# Patient Record
Sex: Male | Born: 1940 | Race: White | Hispanic: No | Smoking: Never smoker
Health system: Southern US, Community
[De-identification: ages and names within clinical notes are randomized; demographics above are authoritative.]

## PROBLEM LIST (undated history)

## (undated) DIAGNOSIS — I6529 Occlusion and stenosis of unspecified carotid artery: Secondary | ICD-10-CM

## (undated) DIAGNOSIS — I480 Paroxysmal atrial fibrillation: Secondary | ICD-10-CM

## (undated) DIAGNOSIS — M549 Dorsalgia, unspecified: Secondary | ICD-10-CM

## (undated) DIAGNOSIS — J42 Unspecified chronic bronchitis: Secondary | ICD-10-CM

## (undated) DIAGNOSIS — G4733 Obstructive sleep apnea (adult) (pediatric): Secondary | ICD-10-CM

## (undated) DIAGNOSIS — I739 Peripheral vascular disease, unspecified: Secondary | ICD-10-CM

## (undated) DIAGNOSIS — I2510 Atherosclerotic heart disease of native coronary artery without angina pectoris: Secondary | ICD-10-CM

## (undated) DIAGNOSIS — K219 Gastro-esophageal reflux disease without esophagitis: Secondary | ICD-10-CM

## (undated) DIAGNOSIS — N183 Chronic kidney disease, stage 3 unspecified: Secondary | ICD-10-CM

## (undated) DIAGNOSIS — R011 Cardiac murmur, unspecified: Secondary | ICD-10-CM

## (undated) DIAGNOSIS — N184 Chronic kidney disease, stage 4 (severe): Secondary | ICD-10-CM

## (undated) DIAGNOSIS — N200 Calculus of kidney: Secondary | ICD-10-CM

## (undated) DIAGNOSIS — I779 Disorder of arteries and arterioles, unspecified: Secondary | ICD-10-CM

## (undated) DIAGNOSIS — E785 Hyperlipidemia, unspecified: Secondary | ICD-10-CM

## (undated) DIAGNOSIS — E119 Type 2 diabetes mellitus without complications: Secondary | ICD-10-CM

## (undated) DIAGNOSIS — Z9289 Personal history of other medical treatment: Secondary | ICD-10-CM

## (undated) DIAGNOSIS — I5031 Acute diastolic (congestive) heart failure: Secondary | ICD-10-CM

## (undated) DIAGNOSIS — J189 Pneumonia, unspecified organism: Secondary | ICD-10-CM

## (undated) DIAGNOSIS — I639 Cerebral infarction, unspecified: Secondary | ICD-10-CM

## (undated) DIAGNOSIS — I10 Essential (primary) hypertension: Secondary | ICD-10-CM

## (undated) DIAGNOSIS — M1990 Unspecified osteoarthritis, unspecified site: Secondary | ICD-10-CM

## (undated) DIAGNOSIS — Z9989 Dependence on other enabling machines and devices: Secondary | ICD-10-CM

## (undated) DIAGNOSIS — G8929 Other chronic pain: Secondary | ICD-10-CM

## (undated) HISTORY — DX: Hyperlipidemia, unspecified: E78.5

## (undated) HISTORY — DX: Disorder of arteries and arterioles, unspecified: I77.9

## (undated) HISTORY — DX: Peripheral vascular disease, unspecified: I73.9

## (undated) HISTORY — DX: Personal history of other medical treatment: Z92.89

## (undated) HISTORY — PX: CARDIAC CATHETERIZATION: SHX172

## (undated) HISTORY — PX: CYSTOSCOPY W/ STONE MANIPULATION: SHX1427

## (undated) HISTORY — DX: Other chronic pain: G89.29

## (undated) HISTORY — DX: Paroxysmal atrial fibrillation: I48.0

## (undated) HISTORY — DX: Atherosclerotic heart disease of native coronary artery without angina pectoris: I25.10

## (undated) HISTORY — DX: Gastro-esophageal reflux disease without esophagitis: K21.9

## (undated) HISTORY — DX: Chronic kidney disease, stage 4 (severe): N18.4

## (undated) HISTORY — DX: Dorsalgia, unspecified: M54.9

## (undated) HISTORY — DX: Chronic kidney disease, stage 3 unspecified: N18.30

## (undated) HISTORY — DX: Occlusion and stenosis of unspecified carotid artery: I65.29

## (undated) HISTORY — DX: Essential (primary) hypertension: I10

## (undated) HISTORY — DX: Acute diastolic (congestive) heart failure: I50.31

---

## 1960-12-30 HISTORY — PX: INGUINAL HERNIA REPAIR: SUR1180

## 1967-12-31 HISTORY — PX: KIDNEY STONE SURGERY: SHX686

## 1998-10-13 ENCOUNTER — Ambulatory Visit (HOSPITAL_COMMUNITY): Admission: RE | Admit: 1998-10-13 | Discharge: 1998-10-13 | Payer: Self-pay | Admitting: Family Medicine

## 1998-10-13 ENCOUNTER — Encounter: Payer: Self-pay | Admitting: Family Medicine

## 1998-10-17 ENCOUNTER — Encounter: Admission: RE | Admit: 1998-10-17 | Discharge: 1999-01-15 | Payer: Self-pay | Admitting: Family Medicine

## 1999-12-31 DIAGNOSIS — I739 Peripheral vascular disease, unspecified: Secondary | ICD-10-CM

## 1999-12-31 HISTORY — DX: Peripheral vascular disease, unspecified: I73.9

## 2002-06-02 ENCOUNTER — Encounter: Admission: RE | Admit: 2002-06-02 | Discharge: 2002-06-02 | Payer: Self-pay | Admitting: Family Medicine

## 2002-06-02 ENCOUNTER — Encounter: Payer: Self-pay | Admitting: Family Medicine

## 2002-11-01 ENCOUNTER — Encounter: Payer: Self-pay | Admitting: Emergency Medicine

## 2002-11-01 ENCOUNTER — Inpatient Hospital Stay (HOSPITAL_COMMUNITY): Admission: EM | Admit: 2002-11-01 | Discharge: 2002-11-07 | Payer: Self-pay | Admitting: Neurosurgery

## 2002-11-02 ENCOUNTER — Encounter: Payer: Self-pay | Admitting: Neurosurgery

## 2004-10-31 ENCOUNTER — Ambulatory Visit: Payer: Self-pay | Admitting: Family Medicine

## 2004-11-15 ENCOUNTER — Ambulatory Visit: Payer: Self-pay | Admitting: Family Medicine

## 2004-11-28 ENCOUNTER — Ambulatory Visit: Payer: Self-pay | Admitting: Family Medicine

## 2005-02-07 ENCOUNTER — Ambulatory Visit: Payer: Self-pay | Admitting: Family Medicine

## 2005-03-05 ENCOUNTER — Ambulatory Visit: Payer: Self-pay | Admitting: Family Medicine

## 2005-03-20 ENCOUNTER — Encounter: Admission: RE | Admit: 2005-03-20 | Discharge: 2005-03-20 | Payer: Self-pay | Admitting: Family Medicine

## 2005-03-20 ENCOUNTER — Ambulatory Visit: Payer: Self-pay | Admitting: Family Medicine

## 2005-03-22 ENCOUNTER — Ambulatory Visit: Payer: Self-pay | Admitting: Family Medicine

## 2005-03-26 ENCOUNTER — Ambulatory Visit: Payer: Self-pay | Admitting: Family Medicine

## 2005-04-02 ENCOUNTER — Ambulatory Visit: Payer: Self-pay | Admitting: Family Medicine

## 2005-04-09 ENCOUNTER — Ambulatory Visit: Payer: Self-pay | Admitting: Family Medicine

## 2005-04-15 ENCOUNTER — Ambulatory Visit: Payer: Self-pay | Admitting: Family Medicine

## 2005-04-22 ENCOUNTER — Ambulatory Visit: Payer: Self-pay | Admitting: Family Medicine

## 2005-05-10 ENCOUNTER — Ambulatory Visit: Payer: Self-pay | Admitting: Family Medicine

## 2005-08-19 ENCOUNTER — Ambulatory Visit: Payer: Self-pay | Admitting: Family Medicine

## 2005-08-28 ENCOUNTER — Ambulatory Visit: Payer: Self-pay | Admitting: Family Medicine

## 2005-11-01 ENCOUNTER — Ambulatory Visit: Payer: Self-pay | Admitting: Family Medicine

## 2005-11-14 ENCOUNTER — Ambulatory Visit: Payer: Self-pay | Admitting: Internal Medicine

## 2005-11-28 ENCOUNTER — Encounter (INDEPENDENT_AMBULATORY_CARE_PROVIDER_SITE_OTHER): Payer: Self-pay | Admitting: Specialist

## 2005-11-28 ENCOUNTER — Ambulatory Visit: Payer: Self-pay | Admitting: Internal Medicine

## 2006-11-04 ENCOUNTER — Ambulatory Visit: Payer: Self-pay | Admitting: Family Medicine

## 2006-12-18 ENCOUNTER — Ambulatory Visit: Payer: Self-pay | Admitting: Family Medicine

## 2006-12-18 LAB — CONVERTED CEMR LAB
ALT: 24 units/L (ref 0–40)
AST: 23 units/L (ref 0–37)
Alkaline Phosphatase: 46 units/L (ref 39–117)
BUN: 23 mg/dL (ref 6–23)
Basophils Absolute: 0.1 10*3/uL (ref 0.0–0.1)
Basophils Relative: 0.8 % (ref 0.0–1.0)
Chol/HDL Ratio, serum: 2.9
Cholesterol: 121 mg/dL (ref 0–200)
Creatinine, Ser: 1 mg/dL (ref 0.4–1.5)
Creatinine,U: 233.6 mg/dL
Eosinophil percent: 2.4 % (ref 0.0–5.0)
Glucose, Bld: 188 mg/dL — ABNORMAL HIGH (ref 70–99)
HCT: 41.6 % (ref 39.0–52.0)
HDL: 41.2 mg/dL (ref >39.0)
Hemoglobin: 14.4 g/dL (ref 13.0–17.0)
Hgb A1c MFr Bld: 8 % — ABNORMAL HIGH (ref 4.6–6.0)
LDL Cholesterol: 57 mg/dL (ref 0–99)
Lymphocytes Relative: 30.7 % (ref 12.0–46.0)
MCHC: 34.5 g/dL (ref 30.0–36.0)
MCV: 90.7 fL (ref 78.0–100.0)
Microalb Creat Ratio: 3.9 mg/g (ref 0.0–30.0)
Microalb, Ur: 0.9 mg/dL (ref 0.0–1.9)
Monocytes Absolute: 0.8 10*3/uL — ABNORMAL HIGH (ref 0.2–0.7)
Monocytes Relative: 9.8 % (ref 3.0–11.0)
Neutro Abs: 4.8 10*3/uL (ref 1.4–7.7)
Neutrophils Relative %: 56.3 % (ref 43.0–77.0)
PSA: 0.96 ng/mL (ref 0.10–4.00)
Platelets: 266 10*3/uL (ref 150–400)
Potassium: 4.1 meq/L (ref 3.5–5.1)
RBC: 4.59 M/uL (ref 4.22–5.81)
RDW: 13.1 % (ref 11.5–14.6)
TSH: 1.31 microintl units/mL (ref 0.35–5.50)
Triglyceride fasting, serum: 114 mg/dL (ref 0–149)
VLDL: 23 mg/dL (ref 0–40)
WBC: 8.5 10*3/uL (ref 4.5–10.5)

## 2006-12-22 ENCOUNTER — Encounter: Payer: Self-pay | Admitting: Family Medicine

## 2006-12-22 LAB — CONVERTED CEMR LAB: PSA: 0.96 ng/mL

## 2006-12-31 ENCOUNTER — Ambulatory Visit: Payer: Self-pay | Admitting: Family Medicine

## 2007-01-28 ENCOUNTER — Ambulatory Visit: Payer: Self-pay | Admitting: Family Medicine

## 2007-04-28 ENCOUNTER — Ambulatory Visit: Payer: Self-pay | Admitting: Family Medicine

## 2007-05-28 ENCOUNTER — Ambulatory Visit: Payer: Self-pay | Admitting: Family Medicine

## 2007-05-28 DIAGNOSIS — I10 Essential (primary) hypertension: Secondary | ICD-10-CM | POA: Insufficient documentation

## 2007-05-28 DIAGNOSIS — F528 Other sexual dysfunction not due to a substance or known physiological condition: Secondary | ICD-10-CM | POA: Insufficient documentation

## 2007-05-29 ENCOUNTER — Ambulatory Visit: Payer: Self-pay | Admitting: Cardiology

## 2007-06-17 ENCOUNTER — Ambulatory Visit: Payer: Self-pay

## 2007-06-23 ENCOUNTER — Ambulatory Visit: Payer: Self-pay | Admitting: Cardiology

## 2007-06-24 ENCOUNTER — Ambulatory Visit: Payer: Self-pay | Admitting: Cardiology

## 2007-06-24 LAB — CONVERTED CEMR LAB
BUN: 15 mg/dL (ref 6–23)
Basophils Absolute: 0.1 10*3/uL (ref 0.0–0.1)
Basophils Relative: 0.8 % (ref 0.0–1.0)
CO2: 32 meq/L (ref 19–32)
Calcium: 9.2 mg/dL (ref 8.4–10.5)
Chloride: 100 meq/L (ref 96–112)
Creatinine, Ser: 0.8 mg/dL (ref 0.4–1.5)
Eosinophils Absolute: 0.2 10*3/uL (ref 0.0–0.6)
Eosinophils Relative: 3.1 % (ref 0.0–5.0)
GFR calc Af Amer: 125 mL/min
GFR calc non Af Amer: 103 mL/min
Glucose, Bld: 173 mg/dL — ABNORMAL HIGH (ref 70–99)
HCT: 40.3 % (ref 39.0–52.0)
Hemoglobin: 13.8 g/dL (ref 13.0–17.0)
INR: 0.9 (ref 0.9–2.0)
Lymphocytes Relative: 29 % (ref 12.0–46.0)
MCHC: 34.2 g/dL (ref 30.0–36.0)
MCV: 87.5 fL (ref 78.0–100.0)
Monocytes Absolute: 0.7 10*3/uL (ref 0.2–0.7)
Monocytes Relative: 9.7 % (ref 3.0–11.0)
Neutro Abs: 4.3 10*3/uL (ref 1.4–7.7)
Neutrophils Relative %: 57.4 % (ref 43.0–77.0)
Platelets: 247 10*3/uL (ref 150–400)
Potassium: 4.2 meq/L (ref 3.5–5.1)
Prothrombin Time: 11.6 s (ref 10.0–14.0)
RBC: 4.61 M/uL (ref 4.22–5.81)
RDW: 14.2 % (ref 11.5–14.6)
Sodium: 144 meq/L (ref 135–145)
WBC: 7.4 10*3/uL (ref 4.5–10.5)
aPTT: 27.7 s (ref 26.5–36.5)

## 2007-06-25 ENCOUNTER — Inpatient Hospital Stay (HOSPITAL_BASED_OUTPATIENT_CLINIC_OR_DEPARTMENT_OTHER): Admission: RE | Admit: 2007-06-25 | Discharge: 2007-06-25 | Payer: Self-pay | Admitting: Cardiovascular Disease

## 2007-06-25 ENCOUNTER — Ambulatory Visit: Payer: Self-pay | Admitting: Cardiology

## 2007-06-30 ENCOUNTER — Ambulatory Visit (HOSPITAL_COMMUNITY): Admission: RE | Admit: 2007-06-30 | Discharge: 2007-07-01 | Payer: Self-pay | Admitting: Cardiology

## 2007-06-30 ENCOUNTER — Ambulatory Visit: Payer: Self-pay | Admitting: Cardiology

## 2007-06-30 HISTORY — PX: CORONARY ANGIOPLASTY WITH STENT PLACEMENT: SHX49

## 2007-07-10 ENCOUNTER — Ambulatory Visit: Payer: Self-pay | Admitting: Cardiology

## 2007-07-10 LAB — CONVERTED CEMR LAB
Basophils Absolute: 0.1 10*3/uL (ref 0.0–0.1)
Basophils Relative: 0.7 % (ref 0.0–1.0)
Eosinophils Absolute: 0.2 10*3/uL (ref 0.0–0.6)
Eosinophils Relative: 2.4 % (ref 0.0–5.0)
HCT: 37 % — ABNORMAL LOW (ref 39.0–52.0)
Hemoglobin: 12.7 g/dL — ABNORMAL LOW (ref 13.0–17.0)
Lymphocytes Relative: 23.5 % (ref 12.0–46.0)
MCHC: 34.4 g/dL (ref 30.0–36.0)
MCV: 89.2 fL (ref 78.0–100.0)
Monocytes Absolute: 0.8 10*3/uL — ABNORMAL HIGH (ref 0.2–0.7)
Monocytes Relative: 9.2 % (ref 3.0–11.0)
Neutro Abs: 5.5 10*3/uL (ref 1.4–7.7)
Neutrophils Relative %: 64.2 % (ref 43.0–77.0)
Platelets: 287 10*3/uL (ref 150–400)
RBC: 4.15 M/uL — ABNORMAL LOW (ref 4.22–5.81)
RDW: 14.6 % (ref 11.5–14.6)
WBC: 8.6 10*3/uL (ref 4.5–10.5)

## 2007-07-21 ENCOUNTER — Ambulatory Visit: Payer: Self-pay | Admitting: Cardiology

## 2007-07-23 ENCOUNTER — Encounter (HOSPITAL_COMMUNITY): Admission: RE | Admit: 2007-07-23 | Discharge: 2007-10-21 | Payer: Self-pay | Admitting: Cardiology

## 2007-08-06 ENCOUNTER — Ambulatory Visit: Payer: Self-pay | Admitting: Cardiology

## 2007-08-06 LAB — CONVERTED CEMR LAB
Basophils Absolute: 0.1 10*3/uL (ref 0.0–0.1)
Basophils Relative: 0.7 % (ref 0.0–1.0)
Eosinophils Absolute: 0.2 10*3/uL (ref 0.0–0.6)
Eosinophils Relative: 3.1 % (ref 0.0–5.0)
HCT: 39.8 % (ref 39.0–52.0)
Hemoglobin: 13.9 g/dL (ref 13.0–17.0)
Lymphocytes Relative: 23.3 % (ref 12.0–46.0)
MCHC: 34.9 g/dL (ref 30.0–36.0)
MCV: 90.8 fL (ref 78.0–100.0)
Monocytes Absolute: 0.8 10*3/uL — ABNORMAL HIGH (ref 0.2–0.7)
Monocytes Relative: 10.2 % (ref 3.0–11.0)
Neutro Abs: 4.9 10*3/uL (ref 1.4–7.7)
Neutrophils Relative %: 62.7 % (ref 43.0–77.0)
Platelets: 228 10*3/uL (ref 150–400)
RBC: 4.38 M/uL (ref 4.22–5.81)
RDW: 14.1 % (ref 11.5–14.6)
WBC: 7.8 10*3/uL (ref 4.5–10.5)

## 2007-09-16 ENCOUNTER — Ambulatory Visit: Payer: Self-pay | Admitting: Cardiology

## 2007-09-16 LAB — CONVERTED CEMR LAB
Basophils Absolute: 0 10*3/uL (ref 0.0–0.1)
Basophils Relative: 0.6 % (ref 0.0–1.0)
Eosinophils Absolute: 0.3 10*3/uL (ref 0.0–0.6)
Eosinophils Relative: 4.1 % (ref 0.0–5.0)
HCT: 40 % (ref 39.0–52.0)
Hemoglobin: 13.8 g/dL (ref 13.0–17.0)
Lymphocytes Relative: 27.3 % (ref 12.0–46.0)
MCHC: 34.4 g/dL (ref 30.0–36.0)
MCV: 91.5 fL (ref 78.0–100.0)
Monocytes Absolute: 0.8 10*3/uL — ABNORMAL HIGH (ref 0.2–0.7)
Monocytes Relative: 11.3 % — ABNORMAL HIGH (ref 3.0–11.0)
Neutro Abs: 3.8 10*3/uL (ref 1.4–7.7)
Neutrophils Relative %: 56.7 % (ref 43.0–77.0)
Platelets: 237 10*3/uL (ref 150–400)
RBC: 4.37 M/uL (ref 4.22–5.81)
RDW: 13.7 % (ref 11.5–14.6)
WBC: 6.8 10*3/uL (ref 4.5–10.5)

## 2007-10-15 ENCOUNTER — Ambulatory Visit: Payer: Self-pay | Admitting: Family Medicine

## 2007-10-22 ENCOUNTER — Ambulatory Visit: Payer: Self-pay | Admitting: Cardiology

## 2007-10-22 ENCOUNTER — Encounter (HOSPITAL_COMMUNITY): Admission: RE | Admit: 2007-10-22 | Discharge: 2007-11-02 | Payer: Self-pay | Admitting: Cardiology

## 2007-10-22 LAB — CONVERTED CEMR LAB: Pro B Natriuretic peptide (BNP): 93 pg/mL (ref 0.0–100.0)

## 2007-10-27 ENCOUNTER — Ambulatory Visit: Payer: Self-pay | Admitting: Cardiology

## 2007-10-27 LAB — CONVERTED CEMR LAB
Basophils Absolute: 0 10*3/uL (ref 0.0–0.1)
Basophils Relative: 0.4 % (ref 0.0–1.0)
Eosinophils Absolute: 0.2 10*3/uL (ref 0.0–0.6)
Eosinophils Relative: 2.7 % (ref 0.0–5.0)
HCT: 42.4 % (ref 39.0–52.0)
Hemoglobin: 14.7 g/dL (ref 13.0–17.0)
Lymphocytes Relative: 22.9 % (ref 12.0–46.0)
MCHC: 34.5 g/dL (ref 30.0–36.0)
MCV: 91.5 fL (ref 78.0–100.0)
Monocytes Absolute: 0.7 10*3/uL (ref 0.2–0.7)
Monocytes Relative: 9.8 % (ref 3.0–11.0)
Neutro Abs: 4.7 10*3/uL (ref 1.4–7.7)
Neutrophils Relative %: 64.2 % (ref 43.0–77.0)
Platelets: 247 10*3/uL (ref 150–400)
RBC: 4.64 M/uL (ref 4.22–5.81)
RDW: 13.1 % (ref 11.5–14.6)
WBC: 7.2 10*3/uL (ref 4.5–10.5)

## 2007-11-05 ENCOUNTER — Ambulatory Visit: Payer: Self-pay

## 2008-01-12 ENCOUNTER — Ambulatory Visit: Payer: Self-pay | Admitting: Cardiology

## 2008-01-12 LAB — CONVERTED CEMR LAB
BUN: 15 mg/dL (ref 6–23)
Basophils Absolute: 0 10*3/uL (ref 0.0–0.1)
Basophils Relative: 0.3 % (ref 0.0–1.0)
CO2: 30 meq/L (ref 19–32)
Calcium: 9.4 mg/dL (ref 8.4–10.5)
Chloride: 100 meq/L (ref 96–112)
Creatinine, Ser: 0.9 mg/dL (ref 0.4–1.5)
Eosinophils Absolute: 0.3 10*3/uL (ref 0.0–0.6)
Eosinophils Relative: 3.4 % (ref 0.0–5.0)
GFR calc Af Amer: 109 mL/min
GFR calc non Af Amer: 90 mL/min
Glucose, Bld: 103 mg/dL — ABNORMAL HIGH (ref 70–99)
HCT: 43.4 % (ref 39.0–52.0)
Hemoglobin: 15.2 g/dL (ref 13.0–17.0)
Lymphocytes Relative: 23.7 % (ref 12.0–46.0)
MCHC: 34.9 g/dL (ref 30.0–36.0)
MCV: 92.7 fL (ref 78.0–100.0)
Monocytes Absolute: 0.7 10*3/uL (ref 0.2–0.7)
Monocytes Relative: 7.6 % (ref 3.0–11.0)
Neutro Abs: 6.2 10*3/uL (ref 1.4–7.7)
Neutrophils Relative %: 65 % (ref 43.0–77.0)
Platelets: 260 10*3/uL (ref 150–400)
Potassium: 4.2 meq/L (ref 3.5–5.1)
RBC: 4.68 M/uL (ref 4.22–5.81)
RDW: 13.5 % (ref 11.5–14.6)
Sodium: 139 meq/L (ref 135–145)
WBC: 9.4 10*3/uL (ref 4.5–10.5)

## 2008-01-20 ENCOUNTER — Ambulatory Visit: Payer: Self-pay

## 2008-04-15 ENCOUNTER — Ambulatory Visit: Payer: Self-pay | Admitting: Family Medicine

## 2008-05-12 ENCOUNTER — Ambulatory Visit: Payer: Self-pay

## 2008-07-05 ENCOUNTER — Ambulatory Visit: Payer: Self-pay | Admitting: Cardiology

## 2008-07-07 ENCOUNTER — Ambulatory Visit: Payer: Self-pay | Admitting: Family Medicine

## 2008-07-07 DIAGNOSIS — Z8673 Personal history of transient ischemic attack (TIA), and cerebral infarction without residual deficits: Secondary | ICD-10-CM | POA: Insufficient documentation

## 2008-07-07 LAB — CONVERTED CEMR LAB
ALT: 31 units/L (ref 0–53)
AST: 26 units/L (ref 0–37)
Albumin: 3.8 g/dL (ref 3.5–5.2)
Alkaline Phosphatase: 50 units/L (ref 39–117)
BUN: 17 mg/dL (ref 6–23)
Basophils Absolute: 0.1 10*3/uL (ref 0.0–0.1)
Basophils Relative: 0.9 % (ref 0.0–1.0)
Bilirubin Urine: NEGATIVE
Bilirubin, Direct: 0.1 mg/dL (ref 0.0–0.3)
Blood in Urine, dipstick: NEGATIVE
CO2: 28 meq/L (ref 19–32)
Calcium: 9.3 mg/dL (ref 8.4–10.5)
Chloride: 105 meq/L (ref 96–112)
Cholesterol: 144 mg/dL (ref 0–200)
Creatinine, Ser: 1 mg/dL (ref 0.4–1.5)
Creatinine,U: 16.5 mg/dL
Eosinophils Absolute: 0.2 10*3/uL (ref 0.0–0.7)
Eosinophils Relative: 2.8 % (ref 0.0–5.0)
GFR calc Af Amer: 96 mL/min
GFR calc non Af Amer: 79 mL/min
Glucose, Bld: 94 mg/dL (ref 70–99)
Glucose, Urine, Semiquant: NEGATIVE
HCT: 42 % (ref 39.0–52.0)
HDL: 51.5 mg/dL (ref >39.0)
Hemoglobin: 14.7 g/dL (ref 13.0–17.0)
Hgb A1c MFr Bld: 7.1 % — ABNORMAL HIGH (ref 4.6–6.0)
Ketones, urine, test strip: NEGATIVE
LDL Cholesterol: 78 mg/dL (ref 0–99)
Lymphocytes Relative: 25.9 % (ref 12.0–46.0)
MCHC: 35.1 g/dL (ref 30.0–36.0)
MCV: 92.7 fL (ref 78.0–100.0)
Microalb, Ur: 0.2 mg/dL (ref 0.0–1.9)
Monocytes Absolute: 0.7 10*3/uL (ref 0.1–1.0)
Monocytes Relative: 9.8 % (ref 3.0–12.0)
Neutro Abs: 4 10*3/uL (ref 1.4–7.7)
Neutrophils Relative %: 60.6 % (ref 43.0–77.0)
Nitrite: NEGATIVE
PSA: 1.9 ng/mL (ref 0.10–4.00)
Platelets: 228 10*3/uL (ref 150–400)
Potassium: 4.2 meq/L (ref 3.5–5.1)
Protein, U semiquant: NEGATIVE
RBC: 4.53 M/uL (ref 4.22–5.81)
RDW: 13.1 % (ref 11.5–14.6)
Sodium: 141 meq/L (ref 135–145)
Specific Gravity, Urine: 1.01
TSH: 1.25 microintl units/mL (ref 0.35–5.50)
Total Bilirubin: 1 mg/dL (ref 0.3–1.2)
Total CHOL/HDL Ratio: 2.8
Total Protein: 6.6 g/dL (ref 6.0–8.3)
Triglycerides: 72 mg/dL (ref 0–149)
Urobilinogen, UA: 0.2
VLDL: 14 mg/dL (ref 0–40)
WBC Urine, dipstick: NEGATIVE
WBC: 6.7 10*3/uL (ref 4.5–10.5)
pH: 5

## 2008-07-12 ENCOUNTER — Telehealth: Payer: Self-pay | Admitting: Family Medicine

## 2008-10-13 ENCOUNTER — Ambulatory Visit: Payer: Self-pay | Admitting: Family Medicine

## 2008-12-20 ENCOUNTER — Encounter: Payer: Self-pay | Admitting: Family Medicine

## 2009-01-18 ENCOUNTER — Ambulatory Visit: Payer: Self-pay | Admitting: Cardiology

## 2009-05-17 ENCOUNTER — Ambulatory Visit: Payer: Self-pay

## 2009-05-17 ENCOUNTER — Encounter: Payer: Self-pay | Admitting: Cardiology

## 2009-07-27 ENCOUNTER — Telehealth: Payer: Self-pay | Admitting: Family Medicine

## 2009-08-09 ENCOUNTER — Ambulatory Visit: Payer: Self-pay | Admitting: Family Medicine

## 2009-08-09 DIAGNOSIS — H6120 Impacted cerumen, unspecified ear: Secondary | ICD-10-CM | POA: Insufficient documentation

## 2009-08-14 ENCOUNTER — Ambulatory Visit: Payer: Self-pay | Admitting: Family Medicine

## 2009-08-22 ENCOUNTER — Encounter: Payer: Self-pay | Admitting: Family Medicine

## 2009-08-28 ENCOUNTER — Telehealth: Payer: Self-pay | Admitting: Family Medicine

## 2009-08-29 ENCOUNTER — Encounter: Payer: Self-pay | Admitting: Family Medicine

## 2009-09-28 ENCOUNTER — Encounter: Payer: Self-pay | Admitting: Family Medicine

## 2009-11-02 DIAGNOSIS — L0201 Cutaneous abscess of face: Secondary | ICD-10-CM | POA: Insufficient documentation

## 2009-11-02 DIAGNOSIS — L03211 Cellulitis of face: Secondary | ICD-10-CM

## 2009-11-06 ENCOUNTER — Ambulatory Visit: Payer: Self-pay | Admitting: Family Medicine

## 2009-11-07 ENCOUNTER — Ambulatory Visit: Payer: Self-pay | Admitting: Family Medicine

## 2009-11-13 ENCOUNTER — Telehealth: Payer: Self-pay | Admitting: Family Medicine

## 2010-03-15 DIAGNOSIS — E785 Hyperlipidemia, unspecified: Secondary | ICD-10-CM | POA: Insufficient documentation

## 2010-03-15 DIAGNOSIS — Z9861 Coronary angioplasty status: Secondary | ICD-10-CM

## 2010-03-15 DIAGNOSIS — I498 Other specified cardiac arrhythmias: Secondary | ICD-10-CM | POA: Insufficient documentation

## 2010-03-15 DIAGNOSIS — E669 Obesity, unspecified: Secondary | ICD-10-CM | POA: Insufficient documentation

## 2010-03-15 DIAGNOSIS — I2510 Atherosclerotic heart disease of native coronary artery without angina pectoris: Secondary | ICD-10-CM | POA: Insufficient documentation

## 2010-03-21 ENCOUNTER — Encounter (INDEPENDENT_AMBULATORY_CARE_PROVIDER_SITE_OTHER): Payer: Self-pay | Admitting: *Deleted

## 2010-03-21 ENCOUNTER — Ambulatory Visit: Payer: Self-pay | Admitting: Cardiology

## 2010-03-21 DIAGNOSIS — I200 Unstable angina: Secondary | ICD-10-CM | POA: Insufficient documentation

## 2010-03-27 ENCOUNTER — Inpatient Hospital Stay (HOSPITAL_BASED_OUTPATIENT_CLINIC_OR_DEPARTMENT_OTHER): Admission: RE | Admit: 2010-03-27 | Discharge: 2010-03-27 | Payer: Self-pay | Admitting: Cardiology

## 2010-03-27 ENCOUNTER — Ambulatory Visit: Payer: Self-pay | Admitting: Cardiology

## 2010-04-03 ENCOUNTER — Encounter (INDEPENDENT_AMBULATORY_CARE_PROVIDER_SITE_OTHER): Payer: Self-pay | Admitting: *Deleted

## 2010-04-03 ENCOUNTER — Ambulatory Visit: Payer: Self-pay | Admitting: Cardiology

## 2010-10-04 ENCOUNTER — Encounter: Payer: Self-pay | Admitting: Internal Medicine

## 2010-11-06 ENCOUNTER — Telehealth: Payer: Self-pay | Admitting: Cardiology

## 2010-11-09 ENCOUNTER — Telehealth: Payer: Self-pay | Admitting: Cardiology

## 2010-11-12 ENCOUNTER — Ambulatory Visit: Payer: Self-pay | Admitting: Cardiology

## 2010-11-12 ENCOUNTER — Encounter: Payer: Self-pay | Admitting: Cardiology

## 2010-11-12 DIAGNOSIS — I4949 Other premature depolarization: Secondary | ICD-10-CM | POA: Insufficient documentation

## 2010-11-14 LAB — CONVERTED CEMR LAB
BUN: 15 mg/dL (ref 6–23)
CO2: 28 meq/L (ref 19–32)
Calcium: 9.8 mg/dL (ref 8.4–10.5)
Chloride: 101 meq/L (ref 96–112)
Creatinine, Ser: 1.1 mg/dL (ref 0.4–1.5)
GFR calc non Af Amer: 71.98 mL/min (ref >60)
Glucose, Bld: 219 mg/dL — ABNORMAL HIGH (ref 70–99)
Potassium: 4.7 meq/L (ref 3.5–5.1)
Sodium: 138 meq/L (ref 135–145)
TSH: 1.21 microintl units/mL (ref 0.35–5.50)

## 2010-11-15 ENCOUNTER — Telehealth: Payer: Self-pay | Admitting: Cardiology

## 2010-11-28 ENCOUNTER — Ambulatory Visit: Payer: Self-pay | Admitting: Cardiology

## 2011-01-27 LAB — CONVERTED CEMR LAB
BUN: 10 mg/dL (ref 6–23)
Basophils Absolute: 0 10*3/uL (ref 0.0–0.1)
Basophils Relative: 0.2 % (ref 0.0–3.0)
CO2: 27 meq/L (ref 19–32)
Calcium: 9 mg/dL (ref 8.4–10.5)
Chloride: 103 meq/L (ref 96–112)
Creatinine, Ser: 0.9 mg/dL (ref 0.4–1.5)
Eosinophils Absolute: 0.1 10*3/uL (ref 0.0–0.7)
Eosinophils Relative: 1.3 % (ref 0.0–5.0)
GFR calc non Af Amer: 89 mL/min (ref >60)
Glucose, Bld: 251 mg/dL — ABNORMAL HIGH (ref 70–99)
HCT: 40.1 % (ref 39.0–52.0)
Hemoglobin: 13.5 g/dL (ref 13.0–17.0)
INR: 1 (ref 0.8–1.0)
Lymphocytes Relative: 25.8 % (ref 12.0–46.0)
Lymphs Abs: 2.2 10*3/uL (ref 0.7–4.0)
MCHC: 33.6 g/dL (ref 30.0–36.0)
MCV: 93.8 fL (ref 78.0–100.0)
Monocytes Absolute: 0.5 10*3/uL (ref 0.1–1.0)
Monocytes Relative: 6 % (ref 3.0–12.0)
Neutro Abs: 5.7 10*3/uL (ref 1.4–7.7)
Neutrophils Relative %: 66.7 % (ref 43.0–77.0)
Platelets: 227 10*3/uL (ref 150.0–400.0)
Potassium: 4.1 meq/L (ref 3.5–5.1)
Prothrombin Time: 10.7 s (ref 9.1–11.7)
RBC: 4.28 M/uL (ref 4.22–5.81)
RDW: 13.2 % (ref 11.5–14.6)
Sodium: 138 meq/L (ref 135–145)
WBC: 8.5 10*3/uL (ref 4.5–10.5)
aPTT: 28.6 s (ref 21.7–28.8)

## 2011-01-31 NOTE — Assessment & Plan Note (Signed)
Summary: eph post cath per joan from [facility]  Medications Added NITROSTAT 0.4 MG SUBL (NITROGLYCERIN) 1 tablet under tongue at onset of chest pain; you may repeat every 5 minutes for up to 3 doses.        Visit Type:  EPH..post cath Primary Provider:  Dorena Cookey MD  CC:  sob w/inclines...denies any cp or edema.  History of Present Illness: [name] comes in today having cardiac catheterization for exertional angina.  His cardiac catheterization showed normal left ventricular function, 30% left main, LAD calcification, 40% diffuse plaque in the LAD, wide open stent in the LAD, 50% narrowing and a second diagonal, disability is 7080% narrowing on a bend which had progressed from the previous study, said were sent and a ramus intermedius, 50% circumflex, 40% in the right coronary artery midportion, and distal 34% lesions in the posterior lateral branches.  Since discharge, he's had no recurrent symptoms of angina or chest tightness.  His nitroglycerin paste to be renewed    Clinical Reports Reviewed:  Cardiac Cath:  03/27/2010: Cardiac Cath Findings:   CONCLUSIONS:   1. Well-preserved left ventricular function.   2. Diabetic coronary artery disease with some moderate progression of       proximal LAD and RCA disease from the previous study.   3. A 70-80% stenosis and a steep band in the distal vessel.      DISPOSITION:   1. Continue medical therapy.   2. We will have the patient followup with Dr. Verl Blalock in the office to       discuss possible options.  These include stenting of the distal LAD       with continued medical therapy versus continued medical therapy       alone at the present time.  The RCA and circumflex disease would       not appear to warrant surgery at this time.  With regard to the       distal LAD, it is on a band that the patient is not a candidate for       Plavix having had a previous allergy.  He was able to take Ticlid.       His symptoms  are mild and class I and II at most.  We will review       the options with Dr. Verl Blalock.               Loretha Brasil. Lia Foyer, MD, [facility]         06/30/2007: Cardiac Cath Findings:  [id number] Mid LAD   Cypher DES placed EF 55%   Current Medications (verified): 1)  Klor-Con M20 20 Meq  Tbcr (Potassium Chloride Crys Cr) .... 2 Tabs Qam  2 Tabs At Bedtime;cpx When Due 2)  Atenolol 100 Mg  Tabs (Atenolol) .[name].. 1 Tab Two Times A Day;cpx When Due 3)  Zestril 40 Mg  Tabs (Lisinopril) .... Two Qam 4)  Furosemide 40 Mg  Tabs (Furosemide) .... Take 1 Am & Noon 5)  Simvastatin 20 Mg  Tabs (Simvastatin) .... Take 1 Tab By Mouth At Bedtime ;cpx When Due 6)  Glipizide 10 Mg  Tabs (Glipizide) .... Take One Tablet Twice Daily-Need To Schedule Appt For Cpx 7)  Bufferin 325 Mg  Tabs (Aspirin Buf(Cacarb-Mgcarb-Mgo)) .... Once Daily 8)  Metformin Hcl 1000 Mg  Tabs (Metformin Hcl) .... Two Times A Day 9)  Amlodipine Besylate 10 Mg  Tabs (Amlodipine Besylate) .... Once Daily 10)  Ketoconazole 2 %  Crea (Ketoconazole) .... Apply Two Times A Day 11)  Onetouch Ultra Test  Strp (Glucose Blood) .... Test Once Daily or As Directed By Doctor 12)  Omeprazole 20 Mg Cpdr (Omeprazole) .... Take One Tab Once Daily  Allergies: 1)  ! Adhesive Tape  Past History:  Past Medical History: Last updated: 03/15/2010 CAD, NATIVE VESSEL (ICD-414.01) HYPERLIPIDEMIA (ICD-272.4) HYPERTENSION (ICD-401.9) BRADYCARDIA (ICD-427.89) CEREBROVASCULAR ACCIDENT, HX OF (ICD-V12.50) OBESITY (ICD-278.00) DIABETES MELLITUS, TYPE II (ICD-250.00) CELLULITIS, FACE, RIGHT (ICD-682.0) ERECTILE DYSFUNCTION (ICD-302.72) CERUMEN IMPACTION, RIGHT (ICD-380.4) OTITIS EXTERNA, ACUTE, RIGHT (ICD-380.12)    Past Surgical History: Last updated: 03/15/2010 CVA(htn)  Family History: Last updated: 04/30/2008  father died at 15, peptic ulcer diseasemother died 33, congestive heart failure  3 brothers, one died of lung cancer, one died of COPD, once  alive, but has arthritisone sister, 19s, congestive heart failure  Social History: Last updated: 01/18/2009 Occupation:[employer] insurance Never Smoked Alcohol use-no Drug use-no Regular exercise-yes Married   Risk Factors: Exercise: yes (04/30/2008)  Risk Factors: Smoking Status: never (Apr 30, 2008)  Review of Systems       negative other than history of present illness  Vital Signs:  Patient profile:   70 year old male Height:      69 inches Weight:      262 pounds BMI:     38.83 Pulse rate:   60 / minute Pulse rhythm:   irregular BP sitting:   118 / 60  (left arm) Cuff size:   large  Vitals Entered By: Julaine Hua, CMA (April 03, 2010 2:56 PM)  Physical Exam  General:  he is clearly lost Head:  normocephalic and atraumatic Eyes:  PERRLA/EOM intact; conjunctiva and lids normal. Neck:  Neck supple, no JVD. No masses, thyromegaly or abnormal cervical nodes. Chest Letta Cargile:  no deformities or breast masses noted Lungs:  Clear bilaterally to auscultation and percussion. Heart:  Non-displaced PMI, chest non-tender; regular rate and rhythm, S1, S2 without murmurs, rubs or gallops. Carotid upstroke normal, no bruit. Normal abdominal aortic size, no bruits. Femorals normal pulses, no bruits. Pedals normal pulses. No edema, no varicosities. Msk:  Back normal, normal gait. Muscle strength and tone normal. Pulses:  pulses normal in all 4 extremities Extremities:  No clubbing or cyanosis. Neurologic:  Alert and oriented x 3. Skin:  Intact without lesions or rashes. Psych:  Normal affect.   EKG  Procedure date:  04/03/2010  Findings:      normal sinus rhythm, minimal criteria for LVH, left axis deviation, no change  Impression & Recommendations:  Problem # 1:  CAD, NATIVE VESSEL (ICD-414.01) i have reviewed his cardiac catheterization with him with a heart model. I've asked him to avoid extreme exertion and if he develops chest tightness or shortness of breath to stop.  Have encouraged regular activity however. He also has nitroglycerin has been instructed on how to use it. His updated medication list for this problem includes:    Atenolol 100 Mg Tabs (Atenolol) .[name]... 1 tab two times a day;cpx when due    Zestril 40 Mg Tabs (Lisinopril) .[name]..[name] Two qam    Bufferin 325 Mg Tabs (Aspirin buf(cacarb-mgcarb-mgo)) ..... Once daily    Amlodipine Besylate 10 Mg Tabs (Amlodipine besylate) ..... Once daily    Nitrostat 0.4 Mg Subl (Nitroglycerin) .[name]... 1 tablet under tongue at onset of chest pain; you may repeat every 5 minutes for up to 3 doses.  Problem # 2:  ANGINA, STABLE/EXERTIONAL (ICD-413.9) Assessment: Improved  His updated medication list for  this problem includes:    Atenolol 100 Mg Tabs (Atenolol) .[name]... 1 tab two times a day;cpx when due    Zestril 40 Mg Tabs (Lisinopril) .[name]..[name] Two qam    Bufferin 325 Mg Tabs (Aspirin buf(cacarb-mgcarb-mgo)) ..... Once daily    Amlodipine Besylate 10 Mg Tabs (Amlodipine besylate) ..... Once daily    Nitrostat 0.4 Mg Subl (Nitroglycerin) .[name]... 1 tablet under tongue at onset of chest pain; you may repeat every 5 minutes for up to 3 doses.  Patient Instructions: 1)  Your physician recommends that you schedule a follow-up appointment in: [place] 2)  Your physician recommends that you continue on your current medications as directed. Please refer to the Current Medication list given to you today. Prescriptions: NITROSTAT 0.4 MG SUBL (NITROGLYCERIN) 1 tablet under tongue at onset of chest pain; you may repeat every 5 minutes for up to 3 doses.  #25 x 9   Entered by:   Julaine Hua, CMA   Authorized by:   Renella Cunas, MD, [facility]   Signed by:   Julaine Hua, CMA on 04/03/2010   Method used:   Electronically to        [facility]  [phone]* (retail)       [place], [place]  [address]       Ph: [phone] or [phone]       Fax: [phone]   RxID:   [id number]

## 2011-01-31 NOTE — Assessment & Plan Note (Signed)
Summary: flu shot//ccm  Nurse Visit    Prior Medications: KLOR-CON M20 20 MEQ  TBCR (POTASSIUM CHLORIDE CRYS CR) 2 tabs qam  2 tabs at bedtime;cpx when due ATENOLOL 100 MG  TABS (ATENOLOL) 1 tab two times a day;cpx when due ZESTRIL 40 MG  TABS (LISINOPRIL) two qam FUROSEMIDE 40 MG  TABS (FUROSEMIDE) take 1 am & noon SIMVASTATIN 20 MG  TABS (SIMVASTATIN) Take 1 tab by mouth at bedtime ;cpx when due GLIPIZIDE 10 MG  TABS (GLIPIZIDE) take one tablet twice daily-need to schedule appt for cpx BUFFERIN 325 MG  TABS (ASPIRIN BUF(CACARB-MGCARB-MGO)) once daily METFORMIN HCL 1000 MG  TABS (METFORMIN HCL) two times a day AMLODIPINE BESYLATE 10 MG  TABS (AMLODIPINE BESYLATE) once daily KETOCONAZOLE 2 %  CREA (KETOCONAZOLE) apply two times a day Current Allergies: No known allergies     Orders Added: 1)  Flu Vaccine 70yrs + [id number] 2)  Administration Flu vaccine [id number]  Flu Vaccine Consent Questions     Do you have a history of severe allergic reactions to this vaccine? no    Any prior history of allergic reactions to egg and/or gelatin? no    Do you have a sensitivity to the preservative Thimersol? no    Do you have a past history of Guillan-Barre Syndrome? no    Do you currently have an acute febrile illness? no    Have you ever had a severe reaction to latex? no    Vaccine information given and explained to patient? yes    Are you currently pregnant? no    Lot Number:[id number]   Site Given  Left Deltoid IM-CCC]   .medflu

## 2011-01-31 NOTE — Assessment & Plan Note (Signed)
Summary: severe inner ear pain/reddish discarge/cjr   Vital Signs:  Patient profile:   70 year old male Weight:      261 pounds Temp:     98.0 degrees F oral Pulse rate:   71 / minute BP sitting:   152 / 76  (left arm) Cuff size:   large  Vitals Entered By: Townsend Roger, [place] (August 14, 2009 9:23 AM) CC: severe rt ear pain   History of Present Illness: Here with continued pain in the right ear and the entire right side of the face. He has had this about one week. Was here on 08-09-09 to see Dr. Sherren Mocha, who removed some cerumen from the ear and started him on Cortisporin drops. he felt better for about 2 days, but then the pain returned. Now the pain involves th eroght side of the face and jaw in addition to the ear. No fever.   Allergies: No Known Drug Allergies  Past History:  Past Medical History: Reviewed history from 01/18/2009 and no changes required. Diabetes mellitus, type II Hypertension ED Cerebrovascular accident, hx of 03 CAD Hyperlipidemia  Past Surgical History: Reviewed history from 05/28/2007 and no changes required. CVA(htn)  Review of Systems  The patient denies anorexia, fever, weight loss, weight gain, vision loss, decreased hearing, hoarseness, chest pain, syncope, dyspnea on exertion, peripheral edema, prolonged cough, headaches, hemoptysis, abdominal pain, melena, hematochezia, severe indigestion/heartburn, hematuria, incontinence, genital sores, muscle weakness, suspicious skin lesions, transient blindness, difficulty walking, depression, unusual weight change, abnormal bleeding, enlarged lymph nodes, angioedema, breast masses, and testicular masses.    Physical Exam  General:  Well-developed,well-nourished,in no acute distress; alert,appropriate and cooperative throughout examination Head:  Normocephalic and atraumatic without obvious abnormalities. No apparent alopecia or balding. Eyes:  No corneal or conjunctival inflammation noted. EOMI. Perrla.  Funduscopic exam benign, without hemorrhages, exudates or papilledema. Vision grossly normal. Ears:  right canal is completely blocked by cerumen. The canal is red, tender, and swollen. We are unable to clear it here today Nose:  External nasal examination shows no deformity or inflammation. Nasal mucosa are pink and moist without lesions or exudates. Mouth:  Oral mucosa and oropharynx without lesions or exudates.  Teeth in good repair. Neck:  No deformities, masses, or tenderness noted.   Impression & Recommendations:  Problem # 1:  CERUMEN IMPACTION, RIGHT (ICD-380.4)  Orders: ENT Referral (ENT)  Problem # 2:  OTITIS EXTERNA, ACUTE, RIGHT (ICD-380.12)  His updated medication list for this problem includes:    Cortisporin 3.5-10000-1 Soln (Neomycin-polymyxin-hc) .[name]... 1 gtt r ear two times a day x 1 week  Complete Medication List: 1)  Klor-con M20 20 Meq Tbcr (Potassium chloride crys cr) .... 2 tabs qam  2 tabs at bedtime;cpx when due 2)  Atenolol 100 Mg Tabs (Atenolol) .[name].. 1 tab two times a day;cpx when due 3)  Zestril 40 Mg Tabs (Lisinopril) .... Two qam 4)  Furosemide 40 Mg Tabs (Furosemide) .... Take 1 am & noon 5)  Simvastatin 20 Mg Tabs (Simvastatin) .... Take 1 tab by mouth at bedtime ;cpx when due 6)  Glipizide 10 Mg Tabs (Glipizide) .... Take one tablet twice daily-need to schedule appt for cpx 7)  Bufferin 325 Mg Tabs (Aspirin buf(cacarb-mgcarb-mgo)) .... Once daily 8)  Metformin Hcl 1000 Mg Tabs (Metformin hcl) .... Two times a day 9)  Amlodipine Besylate 10 Mg Tabs (Amlodipine besylate) .... Once daily 10)  Ketoconazole 2 % Crea (Ketoconazole) .... Apply two times a day 11)  Onetouch Ultra  Test Strp (Glucose blood) .... Test once daily or as directed by doctor 12)  Cortisporin 3.5-10000-1 Soln (Neomycin-polymyxin-hc) .[name].. 1 gtt r ear two times a day x 1 week  Patient Instructions: 1)  We set him up to see Dr. Wilburn Cornelia at [facility] today at 2:30 pm

## 2011-01-31 NOTE — Progress Notes (Signed)
Summary: rx refill  Phone Note Refill Request Message from:  Fax from Pharmacy on November 13, 2009 1:42 PM  Refills Requested: Medication #1:  FUROSEMIDE 40 MG  TABS take 1 am & noon  Medication #2:  SIMVASTATIN 20 MG  TABS Take 1 tab by mouth at bedtime ;cpx when due  Medication #3:  AMLODIPINE BESYLATE 10 MG  TABS once daily Initial call taken by: Westley Hummer CMA ([place]),  November 13, 2009 1:42 PM    Prescriptions: AMLODIPINE BESYLATE 10 MG  TABS (AMLODIPINE BESYLATE) once daily  #100 Tablet x 0   Entered by:   Westley Hummer CMA ([place])   Authorized by:   Dorena Cookey MD   Signed by:   Westley Hummer CMA ([place]) on 11/13/2009   Method used:   Electronically to        [facility]  [phone]* (retail)       [place], [place]  [address]       Ph: [phone] or [phone]       Fax: [phone]   RxIDTX:[id number] SIMVASTATIN 20 MG  TABS (SIMVASTATIN) Take 1 tab by mouth at bedtime ;cpx when due  #100 Tablet x 0   Entered by:   Westley Hummer CMA ([place])   Authorized by:   Dorena Cookey MD   Signed by:   Westley Hummer CMA ([place]) on 11/13/2009   Method used:   Electronically to        [facility]  [phone]* (retail)       [address] [place], [place]  [address]       Ph: [phone] or [phone]       Fax: [phone]   RxIDOP:[id number] FUROSEMIDE 40 MG  TABS (FUROSEMIDE) take 1 am & noon  #200 Tablet x 0   Entered by:   Westley Hummer CMA ([place])   Authorized by:   Dorena Cookey MD   Signed by:   Westley Hummer CMA ([place]) on 11/13/2009   Method used:   Electronically to        [facility]  [phone]* (retail)       [place],   [address]       Ph: [phone] or [phone]       Fax: [phone]   RxID:   [id number]

## 2011-01-31 NOTE — Assessment & Plan Note (Signed)
Summary: f61m  Medications Added ATENOLOL 50 MG TABS (ATENOLOL) Take one tablet by mouth twice a day FUROSEMIDE 40 MG  TABS (FUROSEMIDE) 1 TAB QAM..Marland KitchenEXTRA TAB IF NEEDED OMEPRAZOLE 20 MG CPDR (OMEPRAZOLE) take one tab once daily as needed ASPIRIN EC 325 MG TBEC (ASPIRIN) 1 TAB at bedtime LISINOPRIL 40 MG TABS (LISINOPRIL) Take one tablet by mouth every morning.        Visit Type:  6 mo f/u Primary Provider:  Dorena Cookey MD  CC:  sob at times...denies any cp or edema.  History of Present Illness: [name] comes in today for increased shortness of breath and a slow heart rate.  Because the office of the day when he awoke and became a little bit short of breath just getting around. He had no angina. Catheterization this past March was stable.  He checked his blood pressure got his heart rate to be 33. He was told to hold his atenolol. His heart rate jumped up to 110 he took it again this morning and yesterday. He is on 100 mg twice a day.  He ran of his lisinopril and has not had it renewed. He has been a lot of stress with his wife recently breaking her hip.  Clinical Reports Reviewed:  Cardiac Cath:  03/27/2010: Cardiac Cath Findings:   CONCLUSIONS:   1. Well-preserved left ventricular function.   2. Diabetic coronary artery disease with some moderate progression of       proximal LAD and RCA disease from the previous study.   3. A 70-80% stenosis and a steep band in the distal vessel.      DISPOSITION:   1. Continue medical therapy.   2. We will have the patient followup with Dr. Verl [name] in the office to       discuss possible options.  These include stenting of the distal LAD       with continued medical therapy versus continued medical therapy       alone at the present time.  The RCA and circumflex disease would       not appear to warrant surgery at this time.  With regard to the       distal LAD, it is on a band that the patient is not a candidate for   Plavix having had a previous allergy.  He was able to take Ticlid.       His symptoms are mild and class I and II at most.  We will review       the options with Dr. Verl [name].               [name]. Lia Foyer, MD, [facility]         06/30/2007: Cardiac Cath Findings:  [id number] Mid LAD   Cypher DES placed EF 55%  Nuclear ETT:  01/20/2008: Nuclear ETT Comments:  Normal Tc-63m sestamibi myocardial perfusion study:  Nuclear ETT Findings:  EF 62% No ischemia  06/17/2007: Nuclear ETT Findings:  EF 62% Lateral apical ischemia   Current Medications (verified): 1)  Klor-Con M20 20 Meq  Tbcr (Potassium Chloride Crys Cr) .... 2 Tabs Qam  2 Tabs At Bedtime;cpx When Due 2)  Atenolol 100 Mg  Tabs (Atenolol) .[name].. 1 Tab Two Times A Day 3)  Furosemide 40 Mg  Tabs (Furosemide) .[name].. 1 Tab Qam...extra Tab If Needed 4)  Simvastatin 20 Mg  Tabs (Simvastatin) .... Take 1 Tab By Mouth At Bedtime ;cpx When Due 5)  Glipizide 10 Mg  Tabs (Glipizide) .... Take One Tablet Twice Daily-Need To Schedule Appt For Cpx 6)  Bufferin 325 Mg  Tabs (Aspirin Buf(Cacarb-Mgcarb-Mgo)) .... Once Daily 7)  Metformin Hcl 1000 Mg  Tabs (Metformin Hcl) .... Two Times A Day 8)  Amlodipine Besylate 10 Mg  Tabs (Amlodipine Besylate) .... Once Daily 9)  Ketoconazole 2 %  Crea (Ketoconazole) .... Apply Two Times A Day 10)  Onetouch Ultra Test  Strp (Glucose Blood) .... Test Once Daily or As Directed By Doctor 11)  Omeprazole 20 Mg Cpdr (Omeprazole) .... Take One Tab Once Daily As Needed 12)  Nitrostat 0.4 Mg Subl (Nitroglycerin) .[name].. 1 Tablet Under Tongue At Onset of Chest Pain; You May Repeat Every 5 Minutes For Up To 3 Doses. 13)  Aspirin Ec 325 Mg Tbec (Aspirin) .[name].. 1 Tab At Bedtime  Allergies: 1)  ! * Plavix 2)  ! Adhesive Tape  Past History:  Past Medical History: Last updated: 03/15/2010 CAD, NATIVE VESSEL (ICD-414.01) HYPERLIPIDEMIA (ICD-272.4) HYPERTENSION (ICD-401.9) BRADYCARDIA (ICD-427.89) CEREBROVASCULAR ACCIDENT, HX  OF (ICD-V12.50) OBESITY (ICD-278.00) DIABETES MELLITUS, TYPE II (ICD-250.00) CELLULITIS, FACE, RIGHT (ICD-682.0) ERECTILE DYSFUNCTION (ICD-302.72) CERUMEN IMPACTION, RIGHT (ICD-380.4) OTITIS EXTERNA, ACUTE, RIGHT (ICD-380.12)    Past Surgical History: Last updated: 03/15/2010 CVA(htn)  Family History: Last updated: 2008-04-28  father died at 90, peptic ulcer diseasemother died 4, congestive heart failure  3 brothers, one died of lung cancer, one died of COPD, once alive, but has arthritisone sister, 61s, congestive heart failure  Social History: Last updated: 01/18/2009 Occupation:[employer] insurance Never Smoked Alcohol use-no Drug use-no Regular exercise-yes Married   Risk Factors: Exercise: yes (04/28/2008)  Risk Factors: Smoking Status: never (April 28, 2008)  Review of Systems       negative history of present illness  Vital Signs:  Patient profile:   70 year old male Height:      69 inches Weight:      260.4 pounds BMI:     38.59 Pulse rate:   77 / minute Pulse rhythm:   irregular BP sitting:   142 / 52  (left arm) Cuff size:   large  Vitals Entered By: Julaine Hua, CMA (November 12, 2010 3:34 PM)  Physical Exam  General:  obese.  no acute distress, skin warm and dry Head:  normocephalic and atraumatic Eyes:  PERRLA/EOM intact; conjunctiva and lids normal. Neck:  Neck supple, no JVD. No masses, thyromegaly or abnormal cervical nodes. Chest Jeanna Giuffre:  no deformities or breast masses noted Lungs:  Clear bilaterally to auscultation and percussion. Heart:  PMI nondisplaced, frequent ectopy, variable S1-S2, no carotid bruit Msk:  Back normal, normal gait. Muscle strength and tone normal. Pulses:  pulses normal in all 4 extremities Extremities:  No clubbing or cyanosis. Neurologic:  Alert and oriented x 3. Skin:  Intact without lesions or rashes. Psych:  Normal affect.   Impression & Recommendations:  Problem # 1:  ANGINA, STABLE/EXERTIONAL  (ICD-413.9) Assessment Unchanged  The following medications were removed from the medication list:    Zestril 40 Mg Tabs (Lisinopril) .[name]..[name] Two qam His updated medication list for this problem includes:    Atenolol 50 Mg Tabs (Atenolol) .[name]... Take one tablet by mouth twice a day    Bufferin 325 Mg Tabs (Aspirin buf(cacarb-mgcarb-mgo)) ..... Once daily    Amlodipine Besylate 10 Mg Tabs (Amlodipine besylate) ..... Once daily    Nitrostat 0.4 Mg Subl (Nitroglycerin) .[name]... 1 tablet under tongue at onset of chest pain; you may repeat every 5 minutes for up to 3  doses.    Aspirin Ec 325 Mg Tbec (Aspirin) .[name]... 1 tab at bedtime    Lisinopril 40 Mg Tabs (Lisinopril) .[name]... Take one tablet by mouth every morning.  Problem # 2:  CAD, NATIVE VESSEL (ICD-414.01) Assessment: Unchanged  The following medications were removed from the medication list:    Zestril 40 Mg Tabs (Lisinopril) .[name]..[name] Two qam His updated medication list for this problem includes:    Atenolol 50 Mg Tabs (Atenolol) .[name]... Take one tablet by mouth twice a day    Bufferin 325 Mg Tabs (Aspirin buf(cacarb-mgcarb-mgo)) ..... Once daily    Amlodipine Besylate 10 Mg Tabs (Amlodipine besylate) ..... Once daily    Nitrostat 0.4 Mg Subl (Nitroglycerin) .[name]... 1 tablet under tongue at onset of chest pain; you may repeat every 5 minutes for up to 3 doses.    Aspirin Ec 325 Mg Tbec (Aspirin) .[name]... 1 tab at bedtime    Lisinopril 40 Mg Tabs (Lisinopril) .[name]... Take one tablet by mouth every morning.  Orders: EKG w/ Interpretation (93000) TLB-TSH (Thyroid Stimulating Hormone) (84443-TSH) TLB-BMP (Basic Metabolic Panel-BMET) (99991111)  Problem # 3:  HYPERTENSION (ICD-401.9) Assessment: Deteriorated Will restart lisinopril 40 mg p.o. q.a.m. Check electrolytes in 2 weeks. He has not had a problem with this medication the past. The following medications were removed from the medication list:    Zestril 40 Mg Tabs (Lisinopril) .[name]..[name] Two  qam His updated medication list for this problem includes:    Atenolol 50 Mg Tabs (Atenolol) .[name]... Take one tablet by mouth twice a day    Furosemide 40 Mg Tabs (Furosemide) .[name]... 1 tab qam...extra tab if needed    Bufferin 325 Mg Tabs (Aspirin buf(cacarb-mgcarb-mgo)) ..... Once daily    Amlodipine Besylate 10 Mg Tabs (Amlodipine besylate) ..... Once daily    Aspirin Ec 325 Mg Tbec (Aspirin) .[name]... 1 tab at bedtime    Lisinopril 40 Mg Tabs (Lisinopril) .[name]... Take one tablet by mouth every morning.  Problem # 4:  BRADYCARDIA (ICD-427.89) Assessment: New His heart rate is falsely low on his monitor also frequent ventricular ectopic beats. When he held his atenolol his heart rate jumped up to 110. I will decrease his atenolol to 50 mg twice a day in hopes of increasing his heart rate suppressing his ectopy. We'll also check a TSH and electrolytes. I do not think this is ischemic related. The following medications were removed from the medication list:    Zestril 40 Mg Tabs (Lisinopril) .[name]..[name] Two qam His updated medication list for this problem includes:    Atenolol 50 Mg Tabs (Atenolol) .[name]... Take one tablet by mouth twice a day    Bufferin 325 Mg Tabs (Aspirin buf(cacarb-mgcarb-mgo)) ..... Once daily    Amlodipine Besylate 10 Mg Tabs (Amlodipine besylate) ..... Once daily    Nitrostat 0.4 Mg Subl (Nitroglycerin) .[name]... 1 tablet under tongue at onset of chest pain; you may repeat every 5 minutes for up to 3 doses.    Aspirin Ec 325 Mg Tbec (Aspirin) .[name]... 1 tab at bedtime    Lisinopril 40 Mg Tabs (Lisinopril) .[name]... Take one tablet by mouth every morning.  Patient Instructions: 1)  Your physician recommends that you schedule a follow-up appointment in: 2 weeks with Dr. Verl [name] 2)  Your physician recommends that you have lab work today TSH, BMET 3)  Your physician has recommended you make the following change in your medication:  Prescriptions: LISINOPRIL 40 MG TABS (LISINOPRIL) Take one tablet by  mouth every morning.  #30  x 6   Entered by:   Joelyn Oms RN   Authorized by:   Renella Cunas, MD, [facility]   Signed by:   Joelyn Oms RN on 11/12/2010   Method used:   Electronically to        [facility]  [phone]* (retail)       [place], [place]  [address]       Ph: [phone] or [phone]       Fax: [phone]   RxID:   [id number] ATENOLOL 50 MG TABS (ATENOLOL) Take one tablet by mouth twice a day  #60 x 6   Entered by:   Joelyn Oms RN   Authorized by:   Renella Cunas, MD, [facility]   Signed by:   Joelyn Oms RN on 11/12/2010   Method used:   Electronically to        [facility]  [phone]* (retail)       [place], [place]  [address]       Ph: [phone] or [phone]       Fax: [phone]   RxID:   [id number]

## 2011-01-31 NOTE — Miscellaneous (Signed)
  Clinical Lists Changes  Orders: Added new Service order of EKG w/ Interpretation (93000) - Signed

## 2011-01-31 NOTE — Progress Notes (Signed)
Summary: metformin  Phone Note Refill Request Message from:  Fax from Pharmacy on July 27, 2009 1:15 PM  Refills Requested: Medication #1:  METFORMIN HCL 1000 MG  TABS two times a day Initial call taken by: Westley Hummer CMA,  July 27, 2009 1:15 PM    Prescriptions: METFORMIN HCL 1000 MG  TABS (METFORMIN HCL) two times a day  #60 x 0   Entered by:   Westley Hummer CMA   Authorized by:   Dorena Cookey MD   Signed by:   Westley Hummer CMA on 07/27/2009   Method used:   Electronically to        [facility]  [phone]* (retail)       [name], [place]  [address]       Ph: [phone] or [phone]       Fax: [phone]   RxID:   [id number]

## 2011-01-31 NOTE — Assessment & Plan Note (Signed)
Summary: CPX /NTA/resch from bump list/jls   Vital Signs:  Patient Profile:   70 Years Old Male Height:     69 inches Weight:      252 pounds Temp:     98.2 degrees F oral Pulse rate:   60 / minute BP sitting:   150 / 80  (left arm)  Vitals Entered By: Westley Hummer CMA (July 07, 2008 10:27 AM)                 Chief Complaint:  cpx.  History of Present Illness: [name] is a 70 year old male, who comes in today for yearly evaluation of hypertension, hyperlipidemia, diabetes, type II.  He states overall he feels well.  He saw Dr. wall two days ago in Dr. Roselyn Reef.  Said he was fine and stop his Ticlid.  He continues to take an aspirin tablet daily.  It's been 6 years since he had the stroke and a stent well.  He does not complain of any neurologic deficit.    Current Allergies: No known allergies   Past Medical History:    Reviewed history from 05/28/2007 and no changes required:       Diabetes mellitus, type II       Hypertension       ED       Cerebrovascular accident, hx of 03   Family History:    Reviewed history from 04/15/2008 and no changes required:              father died at 45, peptic ulcer diseasemother died 20, congestive heart failure              3 brothers, one died of lung cancer, one died of COPD, once alive, but has arthritisone sister, 43s, congestive heart failure  Social History:    Reviewed history from 04/15/2008 and no changes required:       Occupation:[employer]       Never Smoked       Alcohol use-no       Drug use-no       Regular exercise-yes    Review of Systems      See HPI   Physical Exam  General:     Well-developed,well-nourished,in no acute distress; alert,appropriate and cooperative throughout examination Head:     Normocephalic and atraumatic without obvious abnormalities. No apparent alopecia or balding. Eyes:     No corneal or conjunctival inflammation noted. EOMI. Perrla. Funduscopic exam benign, without  hemorrhages, exudates or papilledema. Vision grossly normal. Ears:     External ear exam shows no significant lesions or deformities.  Otoscopic examination reveals clear canals, tympanic membranes are intact bilaterally without bulging, retraction, inflammation or discharge. Hearing is grossly normal bilaterally. Nose:     External nasal examination shows no deformity or inflammation. Nasal mucosa are pink and moist without lesions or exudates. Mouth:     Oral mucosa and oropharynx without lesions or exudates.  Teeth in good repair. Neck:     No deformities, masses, or tenderness noted. Chest Wall:     No deformities, masses, tenderness or gynecomastia noted. Breasts:     No masses or gynecomastia noted Lungs:     Normal respiratory effort, chest expands symmetrically. Lungs are clear to auscultation, no crackles or wheezes. Heart:     Normal rate and regular rhythm. S1 and S2 normal without gallop, murmur, click, rub or other extra sounds. Rectal:     No external abnormalities noted. Normal  sphincter tone. No rectal masses or tenderness. Genitalia:     Testes bilaterally descended without nodularity, tenderness or masses. No scrotal masses or lesions. No penis lesions or urethral discharge. Prostate:     Prostate gland firm and smooth, no enlargement, nodularity, tenderness, mass, asymmetry or induration. Msk:     No deformity or scoliosis noted of thoracic or lumbar spine.   Pulses:     R and L carotid,radial,femoral,dorsalis pedis and posterior tibial pulses are full and equal bilaterally Extremities:     No clubbing, cyanosis, edema, or deformity noted with normal full range of motion of all joints.   Neurologic:     No cranial nerve deficits noted. Station and gait are normal. Plantar reflexes are down-going bilaterally. DTRs are symmetrical throughout. Sensory, motor and coordinative functions appear intact. Skin:     persistent fungal infection under both breasts.  Patient  forgets to use the Nizoral cream Cervical Nodes:     No lymphadenopathy noted Axillary Nodes:     No palpable lymphadenopathy Inguinal Nodes:     No significant adenopathy Psych:     Cognition and judgment appear intact. Alert and cooperative with normal attention span and concentration. No apparent delusions, illusions, hallucinations    Impression & Recommendations:  Problem # 1:  CEREBROVASCULAR ACCIDENT, HX OF (ICD-V12.50) Assessment: Improved  Orders: Venipuncture [id number]) TLB-Lipid Panel (80061-LIPID) TLB-BMP (Basic Metabolic Panel-BMET) (99991111) TLB-CBC Platelet - w/Differential (85025-CBCD) TLB-Hepatic/Liver Function Pnl (80076-HEPATIC) TLB-TSH (Thyroid Stimulating Hormone) (84443-TSH) TLB-PSA (Prostate Specific Antigen) (84153-PSA) TLB-Microalbumin/Creat Ratio, Urine (82043-MALB) TLB-A1C / Hgb A1C (Glycohemoglobin) (83036-A1C)   Problem # 2:  HYPERTENSION (ICD-401.9) Assessment: Improved  The following medications were removed from the medication list:    Amlodipine Besylate 5 Mg Tabs (Amlodipine besylate) ..... Once daily  His updated medication list for this problem includes:    Atenolol 100 Mg Tabs (Atenolol) .[name]... 1 tab two times a day;cpx when due    Zestril 40 Mg Tabs (Lisinopril) .[name]..[name] Two qam    Furosemide 40 Mg Tabs (Furosemide) .[name]... Take 1 am & noon    Amlodipine Besylate 10 Mg Tabs (Amlodipine besylate) ..... Once daily  Orders: Venipuncture [id number]) TLB-Lipid Panel (80061-LIPID) TLB-BMP (Basic Metabolic Panel-BMET) (99991111) TLB-CBC Platelet - w/Differential (85025-CBCD) TLB-Hepatic/Liver Function Pnl (80076-HEPATIC) TLB-TSH (Thyroid Stimulating Hormone) (84443-TSH) TLB-PSA (Prostate Specific Antigen) (84153-PSA) TLB-Microalbumin/Creat Ratio, Urine (82043-MALB) TLB-A1C / Hgb A1C (Glycohemoglobin) (83036-A1C)   Problem # 3:  DIABETES MELLITUS, TYPE II (ICD-250.00) Assessment: Improved  His updated medication list for this problem  includes:    Zestril 40 Mg Tabs (Lisinopril) .[name]..[name] Two qam    Glipizide 10 Mg Tabs (Glipizide) .[name]... Take one tablet twice daily-need to schedule appt for cpx    Bufferin 325 Mg Tabs (Aspirin buf(cacarb-mgcarb-mgo)) ..... Once daily    Metformin Hcl 1000 Mg Tabs (Metformin hcl) .[name]..[name] Two times a day  Orders: Venipuncture [id number]) TLB-Lipid Panel (80061-LIPID) TLB-BMP (Basic Metabolic Panel-BMET) (99991111) TLB-CBC Platelet - w/Differential (85025-CBCD) TLB-Hepatic/Liver Function Pnl (80076-HEPATIC) TLB-TSH (Thyroid Stimulating Hormone) (84443-TSH) TLB-PSA (Prostate Specific Antigen) (84153-PSA) TLB-Microalbumin/Creat Ratio, Urine (82043-MALB) TLB-A1C / Hgb A1C (Glycohemoglobin) (83036-A1C) UA Dipstick w/Micro (automated) (81001)   Complete Medication List: 1)  Klor-con M20 20 Meq Tbcr (Potassium chloride crys cr) .... 2 tabs qam  2 tabs at bedtime;cpx when due 2)  Atenolol 100 Mg Tabs (Atenolol) .[name].. 1 tab two times a day;cpx when due 3)  Zestril 40 Mg Tabs (Lisinopril) .... Two qam 4)  Furosemide 40 Mg Tabs (Furosemide) .... Take 1 am & noon 5)  Simvastatin 20 Mg Tabs (Simvastatin) .... Take 1 tab by mouth at bedtime ;cpx when due 6)  Glipizide 10 Mg Tabs (Glipizide) .... Take one tablet twice daily-need to schedule appt for cpx 7)  Bufferin 325 Mg Tabs (Aspirin buf(cacarb-mgcarb-mgo)) .... Once daily 8)  Metformin Hcl 1000 Mg Tabs (Metformin hcl) .... Two times a day 9)  Amlodipine Besylate 10 Mg Tabs (Amlodipine besylate) .... Once daily 10)  Ketoconazole 2 % Crea (Ketoconazole) .... Apply two times a day  Other Orders: Tetanus Toxoid w/Dx [id number]) Admin 1st Vaccine [phone])   Patient Instructions: 1)  Please schedule a follow-up appointment in 6 months. 2)  It is important that you exercise regularly at least 20 minutes 5 times a week. If you develop chest pain, have severe difficulty breathing, or feel very tired , stop exercising immediately and seek medical  attention. 3)  You need to lose weight. Consider a lower calorie diet and regular exercise.  4)  Check your blood sugars regularly. If your readings are usually above : or below 70 you should contact our office. 5)  It is important that your Diabetic A1c level is checked every 3 months. 6)  See your eye doctor yearly to check for diabetic eye damage. 7)  Check your feet each night for sore areas, calluses or signs of infection. 8)  Check your Blood Pressure regularly. If it is above: you should make an appointment.   Prescriptions: KETOCONAZOLE 2 %  CREA (KETOCONAZOLE) apply two times a day  #60 gr. x 5   Entered and Authorized by:   Dorena Cookey MD   Signed by:   Dorena Cookey MD on 07/07/2008   Method used:   Electronically sent to ...       [facility]  [phone]*       [place], [place]  [address]       Ph: [phone] or [phone]       Fax: [phone]   RxID:   [id number] AMLODIPINE BESYLATE 10 MG  TABS (AMLODIPINE BESYLATE) once daily  #100 x 3   Entered and Authorized by:   Dorena Cookey MD   Signed by:   Dorena Cookey MD on 07/07/2008   Method used:   Electronically sent to ...       [facility]  [phone]*       [place], [place]  [address]       Ph: [phone] or [phone]       Fax: [phone]   RxID:   [id number] METFORMIN HCL 1000 MG  TABS (METFORMIN HCL) two times a day  #200 Tablet x 2   Entered and Authorized by:   Dorena Cookey MD   Signed by:   Dorena Cookey MD on 07/07/2008   Method used:   Electronically sent to ...       [facility]  [phone]*       [place], [place]  [address]       Ph: [phone] or [phone]       Fax: [phone]   RxID:   [id number] GLIPIZIDE 10 MG  TABS (GLIPIZIDE) take one tablet twice daily-need to schedule appt for cpx  #200 x 3   Entered and Authorized by:   Dorena Cookey MD  Signed by:    Dorena Cookey MD on 07/07/2008   Method used:   Electronically sent to ...       [facility]  [phone]*       [place], [place]  [address]       Ph: [phone] or [phone]       Fax: [phone]   RxID:   [id number] SIMVASTATIN 20 MG  TABS (SIMVASTATIN) Take 1 tab by mouth at bedtime ;cpx when due  #100 x 3   Entered and Authorized by:   Dorena Cookey MD   Signed by:   Dorena Cookey MD on 07/07/2008   Method used:   Electronically sent to ...       [facility]  [phone]*       [place], [place]  [address]       Ph: [phone] or [phone]       Fax: [phone]   RxID:   [id number] FUROSEMIDE 40 MG  TABS (FUROSEMIDE) take 1 am & noon  #200 x 3   Entered and Authorized by:   Dorena Cookey MD   Signed by:   Dorena Cookey MD on 07/07/2008   Method used:   Electronically sent to ...       [facility]  [phone]*       [place], [place]  [address]       Ph: [phone] or [phone]       Fax: [phone]   RxID:   [id number] ZESTRIL 40 MG  TABS (LISINOPRIL) two qam  #200 x 3   Entered and Authorized by:   Dorena Cookey MD   Signed by:   Dorena Cookey MD on 07/07/2008   Method used:   Electronically sent to ...       [facility]  [phone]*       [place], [place]  [address]       Ph: [phone] or [phone]       Fax: [phone]   RxID:   [id number] ATENOLOL 100 MG  TABS (ATENOLOL) 1 tab two times a day;cpx when due  #200 x 3   Entered and Authorized by:   Dorena Cookey MD   Signed by:   Dorena Cookey MD on 07/07/2008   Method used:   Electronically sent to ...       [facility]  [phone]*       [place], [place]  [address]       Ph: [phone] or [phone]       Fax: [phone]   RxID:   [id number] KLOR-CON M20 20 MEQ  TBCR (POTASSIUM  CHLORIDE CRYS CR) 2 tabs qam  2 tabs at bedtime;cpx when due  #400 x 3   Entered and Authorized by:   Dorena Cookey MD   Signed by:   Dorena Cookey MD on 07/07/2008   Method used:   Electronically sent to ...       [facility]  [phone]*       [address]       [place], [place]  [address]  Ph: [phone] or [phone]       Fax: [phone]   RxID:   [id number]  ]  Tetanus/Td Vaccine    Vaccine Type: Td    Site: left deltoid    Mfr: Sanofi Pasteur    Dose: 0.5 ml    Route: IM    Given by: Westley Hummer CMA    Exp. Date: 01/31/2010    Lot #: [id number]  Laboratory Results   Urine Tests    Routine Urinalysis   Color: yellow Appearance: Clear Glucose: negative   (Normal Range: Negative) Bilirubin: negative   (Normal Range: Negative) Ketone: negative   (Normal Range: Negative) Spec. Gravity: 1.010   (Normal Range: 1.003-1.035) Blood: negative   (Normal Range: Negative) pH: 5.0   (Normal Range: 5.0-8.0) Protein: negative   (Normal Range: Negative) Urobilinogen: 0.2   (Normal Range: 0-1) Nitrite: negative   (Normal Range: Negative) Leukocyte Esterace: negative   (Normal Range: Negative)    Comments: Milica Zimonjic  July 07, 2008 1:32 PM

## 2011-01-31 NOTE — Progress Notes (Signed)
Summary: sob   Phone Note Call from Patient Call back at [phone]   Caller: Patient Summary of Call: Pt having sob Initial call taken by: Delsa Sale,  November 06, 2010 10:03 AM  Follow-up for Phone Call        Patient felt sob when he awoke this morning, more so than usual.  He took his am medications.   He feels much better now and denies sob.  He has appt 11/14 with Dr. Verl Blalock.  He will call back if he has further problems. Horton Chin RN     Appended Document: sob  Reviewed Mar Daring, MD

## 2011-01-31 NOTE — Letter (Signed)
Summary: [facility]   Imported By: Laural Benes 10/04/2009 13:10:30  _____________________________________________________________________  External Attachment:    Type:   Image     Comment:   External Document

## 2011-01-31 NOTE — Progress Notes (Signed)
Summary: omeprazole  Phone Note Refill Request Message from:  Fax from Pharmacy on August 28, 2009 5:01 PM     New/Updated Medications: OMEPRAZOLE 20 MG CPDR (OMEPRAZOLE) take one tab once daily Prescriptions: OMEPRAZOLE 20 MG CPDR (OMEPRAZOLE) take one tab once daily  #90 x 0   Entered by:   Westley Hummer CMA ([place])   Authorized by:   Dorena Cookey MD   Signed by:   Westley Hummer CMA ([place]) on 08/28/2009   Method used:   Electronically to        [facility]  [phone]* (retail)       [place], [place]  [address]       Ph: [phone] or [phone]       Fax: [phone]   RxID:   [id number]

## 2011-01-31 NOTE — Assessment & Plan Note (Signed)
Summary: knot on head-ok to work in per rachel//ccm   Vital Signs:  Patient profile:   70 year old male Weight:      267 pounds Temp:     98.5 degrees F oral BP sitting:   120 / 68  (left arm) Cuff size:   regular  Vitals Entered By: Westley Hummer CMA Deborra Medina) (November 06, 2009 10:11 AM)  Reason for Visit knot on right temple, right side of face swollen and sore   History of Present Illness: still is a 70 year old, married male, nonsmoker, with underlying hypertension, and diabetes, who comes in today for evaluation of redness on his right for head.  On November the fourth he noticed a pimple on his right for head.  He previously had these before and they have always resolved spontaneously.  This time.  He noticed some swelling around the eye and some redness around the lower facial area.  He's had no fever, chills, visual changes, etc..  His underlying type two diabetes typically controlled with metformin 1000 mg b.i.d. and glipizide 10 mg b.i.d. however, in the past month.  He, states he's been caring for his dying sister, who lives out of town.  He is not been following his diet nor checking his blood sugar.  Fastind sugar today is over 200.  Previously his blood sugar has been averaging around 110.  Allergies: No Known Drug Allergies  Past History:  Past medical, surgical, family and social histories (including risk factors) reviewed for relevance to current acute and chronic problems.  Past Medical History: Reviewed history from 01/18/2009 and no changes required. Diabetes mellitus, type II Hypertension ED Cerebrovascular accident, hx of 03 CAD Hyperlipidemia  Past Surgical History: Reviewed history from 05/28/2007 and no changes required. CVA(htn)  Family History: Reviewed history from 04/15/2008 and no changes required.  father died at 16, peptic ulcer diseasemother died 17, congestive heart failure  3 brothers, one died of lung cancer, one died of COPD, once  alive, but has arthritisone sister, 70s, congestive heart failure  Social History: Reviewed history from 01/18/2009 and no changes required. Occupation:[employer] Never Smoked Alcohol use-no Drug use-no Regular exercise-yes Married   Review of Systems      See HPI       Flu Vaccine Consent Questions     Do you have a history of severe allergic reactions to this vaccine? no    Any prior history of allergic reactions to egg and/or gelatin? no    Do you have a sensitivity to the preservative Thimersol? no    Do you have a past history of Guillan-Barre Syndrome? no    Do you currently have an acute febrile illness? no    Have you ever had a severe reaction to latex? no    Vaccine information given and explained to patient? yes    Are you currently pregnant? no    Lot Number:[id number]   Exp Date:06/28/2010   Site Given  Left Deltoid IM   Physical Exam  General:  Well-developed,well-nourished,in no acute distress; alert,appropriate and cooperative throughout examination Skin:  there is a pimple in the right upper for head.  There is some surrounding erythema.  There is some swelling of the upper lid and no erythema.  The pupil is normal with full range of motion.  There is also some slight tenderness and erythema on the right face between the eye and the ear.  Diabetes Management Exam:    Eye Exam:  Eye Exam done elsewhere          Date: 08/30/2009          Results: normal          Done by: [facility] eye   Impression & Recommendations:  Problem # 1:  CELLULITIS, FACE, RIGHT (ICD-682.0) Assessment New  Orders: Prescription Created Electronically [phone]) Admin of Therapeutic Inj  intramuscular or subcutaneous [id number]) Rocephin  250mg  [id number])  His updated medication list for this problem includes:    Augmentin 500-125 Mg Tabs (Amoxicillin-pot clavulanate) .[name]... Take 1 tablet by mouth two times a day  Problem # 2:  DIABETES MELLITUS, TYPE II  (ICD-250.00) Assessment: Deteriorated  His updated medication list for this problem includes:    Zestril 40 Mg Tabs (Lisinopril) .[name]..[name] Two qam    Glipizide 10 Mg Tabs (Glipizide) .[name]... Take one tablet twice daily-need to schedule appt for cpx    Bufferin 325 Mg Tabs (Aspirin buf(cacarb-mgcarb-mgo)) ..... Once daily    Metformin Hcl 1000 Mg Tabs (Metformin hcl) .[name]..[name] Two times a day  Orders: Prescription Created Electronically [phone])  Complete Medication List: 1)  Klor-con M20 20 Meq Tbcr (Potassium chloride crys cr) .... 2 tabs qam  2 tabs at bedtime;cpx when due 2)  Atenolol 100 Mg Tabs (Atenolol) .[name].. 1 tab two times a day;cpx when due 3)  Zestril 40 Mg Tabs (Lisinopril) .... Two qam 4)  Furosemide 40 Mg Tabs (Furosemide) .... Take 1 am & noon 5)  Simvastatin 20 Mg Tabs (Simvastatin) .... Take 1 tab by mouth at bedtime ;cpx when due 6)  Glipizide 10 Mg Tabs (Glipizide) .... Take one tablet twice daily-need to schedule appt for cpx 7)  Bufferin 325 Mg Tabs (Aspirin buf(cacarb-mgcarb-mgo)) .... Once daily 8)  Metformin Hcl 1000 Mg Tabs (Metformin hcl) .... Two times a day 9)  Amlodipine Besylate 10 Mg Tabs (Amlodipine besylate) .... Once daily 10)  Ketoconazole 2 % Crea (Ketoconazole) .... Apply two times a day 11)  Onetouch Ultra Test Strp (Glucose blood) .... Test once daily or as directed by doctor 12)  Omeprazole 20 Mg Cpdr (Omeprazole) .... Take one tab once daily 13)  Augmentin 500-125 Mg Tabs (Amoxicillin-pot clavulanate) .... Take 1 tablet by mouth two times a day  Other Orders: Flu Vaccine 68yrs + [id number]) Administration Flu vaccine - MCR [id number])  Patient Instructions: 1)  See your eye doctor yearly to check for diabetic eye damage. 2)  begin Augmentin 500 mg twice a day.  Apply warm soaks 4 times a day for 15 minutes.  Return tomorrow afternoon for follow-up. 3)  Tightened up on your diabetes control and check a fasting blood sugar daily. 4)  It's always possible.  The  infection could get worse.  If you notice swelling of y eye   go immediately to the hospital emergency room.  We will have to admit u for IV antibiotics Prescriptions: AUGMENTIN 500-125 MG TABS (AMOXICILLIN-POT CLAVULANATE) Take 1 tablet by mouth two times a day  #20 x 1   Entered and Authorized by:   Dorena Cookey MD   Signed by:   Dorena Cookey MD on 11/06/2009   Method used:   Electronically to        [facility]  [phone]* (retail)       [address] [place], [place]  [address]       Ph: [phone] or [phone]       Fax: [phone]  [id number]    Medication Administration  Injection # 1:    Medication: Rocephin  250mg     Diagnosis: CELLULITIS, FACE, RIGHT (ICD-682.0)    Route: IM    Site: RUOQ gluteus    Exp Date: 03/30/2012    Lot #: [id number]    Mfr: novaplus    Comments: 1 gm given    Patient tolerated injection without complications    Given by: Westley Hummer CMA Deborra Medina) (November 06, 2009 11:23 AM)  Orders Added: 1)  Flu Vaccine 53yrs + [id number] 2)  Administration Flu vaccine - MCR [id number] 3)  Prescription Created Electronically [[id number]] 4)  Est. Patient Level IV [id number] 5)  Admin of Therapeutic Inj  intramuscular or subcutaneous [[id number]] 6)  Rocephin  250mg  [id number]

## 2011-01-31 NOTE — Consult Note (Signed)
Summary: [facility]  [facility]   Imported By: Laural Benes 08/30/2009 14:13:50  _____________________________________________________________________  External Attachment:    Type:   Image     Comment:   External Document

## 2011-01-31 NOTE — Assessment & Plan Note (Signed)
Summary: f2w/dfg  Medications Added ATENOLOL 50 MG TABS (ATENOLOL) Take one tablet by mouth twice a day ASPIRIN EC 325 MG TBEC (ASPIRIN) 1 tab two times a day        Visit Type:  2 wk Primary Provider:  Dorena Cookey MD  CC:  pt fell last Tuesday stepped off the curb and twisted his ankle and scraped his right knee....sob at times...denies any other cardiac complaints today.  History of Present Illness: Mr Darnold comes in today for followup of his hypertension and bradycardia.  We decreased his atenolol to 50 mg twice a day. We also started back on lisinopril 40 mg a day.  Since that time he felt well. He said no further dizziness, chest pain, but has developed a cold with a cough for the holidays.  He denies any orthopnea, PND or increased edema.  Current Medications (verified): 1)  Klor-Con M20 20 Meq  Tbcr (Potassium Chloride Crys Cr) .... 2 Tabs Qam  2 Tabs At Bedtime;cpx When Due 2)  Atenolol 50 Mg Tabs (Atenolol) .... Take One Tablet By Mouth Twice A Day 3)  Furosemide 40 Mg  Tabs (Furosemide) .[name].. 1 Tab Qam...extra Tab If Needed 4)  Simvastatin 20 Mg  Tabs (Simvastatin) .... Take 1 Tab By Mouth At Bedtime ;cpx When Due 5)  Glipizide 10 Mg  Tabs (Glipizide) .... Take One Tablet Twice Daily-Need To Schedule Appt For Cpx 6)  Metformin Hcl 1000 Mg  Tabs (Metformin Hcl) .... Two Times A Day 7)  Amlodipine Besylate 10 Mg  Tabs (Amlodipine Besylate) .... Once Daily 8)  Ketoconazole 2 %  Crea (Ketoconazole) .... Apply Two Times A Day 9)  Onetouch Ultra Test  Strp (Glucose Blood) .... Test Once Daily or As Directed By Doctor 10)  Omeprazole 20 Mg Cpdr (Omeprazole) .... Take One Tab Once Daily As Needed 11)  Nitrostat 0.4 Mg Subl (Nitroglycerin) .[name].. 1 Tablet Under Tongue At Onset of Chest Pain; You May Repeat Every 5 Minutes For Up To 3 Doses. 12)  Aspirin Ec 325 Mg Tbec (Aspirin) .[name].. 1 Tab Two Times A Day 13)  Lisinopril 40 Mg Tabs (Lisinopril) .... Take One Tablet By  Mouth Every Morning.  Allergies: 1)  ! * Plavix 2)  ! Adhesive Tape  Past History:  Past Medical History: Last updated: 03/15/2010 CAD, NATIVE VESSEL (ICD-414.01) HYPERLIPIDEMIA (ICD-272.4) HYPERTENSION (ICD-401.9) BRADYCARDIA (ICD-427.89) CEREBROVASCULAR ACCIDENT, HX OF (ICD-V12.50) OBESITY (ICD-278.00) DIABETES MELLITUS, TYPE II (ICD-250.00) CELLULITIS, FACE, RIGHT (ICD-682.0) ERECTILE DYSFUNCTION (ICD-302.72) CERUMEN IMPACTION, RIGHT (ICD-380.4) OTITIS EXTERNA, ACUTE, RIGHT (ICD-380.12)    Past Surgical History: Last updated: 03/15/2010 CVA(htn)  Family History: Last updated: 04/29/08  father died at 56, peptic ulcer diseasemother died 66, congestive heart failure  3 brothers, one died of lung cancer, one died of COPD, once alive, but has arthritisone sister, 43s, congestive heart failure  Social History: Last updated: 01/18/2009 Occupation:[employer] insurance Never Smoked Alcohol use-no Drug use-no Regular exercise-yes Married   Risk Factors: Exercise: yes (04/29/2008)  Risk Factors: Smoking Status: never (2008/04/29)  Vital Signs:  Patient profile:   70 year old male Height:      69 inches Weight:      263.50 pounds BMI:     39.05 Pulse rate:   76 / minute Pulse rhythm:   regular BP sitting:   136 / 58  (left arm) Cuff size:   large  Vitals Entered By: Julaine Hua, CMA (November 28, 2010 11:03 AM)  Physical Exam  General:  obese.  no acute distress, congested Head:  normocephalic and atraumatic Eyes:  PERRLA/EOM intact; conjunctiva and lids normal. Neck:  Neck supple, no JVD. No masses, thyromegaly or abnormal cervical nodes. Lungs:  Clear bilaterally to auscultation and percussion. Heart:  PMI poorly appreciated, soft S1-S2, no murmur or gallop. Carotids full without bruits Msk:  Back normal, normal gait. Muscle strength and tone normal. Pulses:  pulses normal in all 4 extremities Extremities:  1+ left pedal edema and 1+ right pedal  edema.   Neurologic:  Alert and oriented x 3. Skin:  Intact without lesions or rashes. Psych:  Normal affect.   Impression & Recommendations:  Problem # 1:  HYPERTENSION (ICD-401.9) Assessment Improved  The following medications were removed from the medication list:    Bufferin 325 Mg Tabs (Aspirin buf(cacarb-mgcarb-mgo)) ..... Once daily His updated medication list for this problem includes:    Atenolol 50 Mg Tabs (Atenolol) .[name]... Take one tablet by mouth twice a day    Furosemide 40 Mg Tabs (Furosemide) .[name]... 1 tab qam...extra tab if needed    Amlodipine Besylate 10 Mg Tabs (Amlodipine besylate) ..... Once daily    Aspirin Ec 325 Mg Tbec (Aspirin) .[name]... 1 tab two times a day    Lisinopril 40 Mg Tabs (Lisinopril) .[name]... Take one tablet by mouth every morning.  Problem # 2:  BRADYCARDIA (ICD-427.89) Assessment: Improved  The following medications were removed from the medication list:    Bufferin 325 Mg Tabs (Aspirin buf(cacarb-mgcarb-mgo)) ..... Once daily His updated medication list for this problem includes:    Atenolol 50 Mg Tabs (Atenolol) .[name]... Take one tablet by mouth twice a day    Amlodipine Besylate 10 Mg Tabs (Amlodipine besylate) ..... Once daily    Nitrostat 0.4 Mg Subl (Nitroglycerin) .[name]... 1 tablet under tongue at onset of chest pain; you may repeat every 5 minutes for up to 3 doses.    Aspirin Ec 325 Mg Tbec (Aspirin) .[name]... 1 tab two times a day    Lisinopril 40 Mg Tabs (Lisinopril) .[name]... Take one tablet by mouth every morning.  Patient Instructions: 1)  Your physician recommends that you schedule a follow-up appointment in: 6 months with Dr. Verl Blalock 2)  Your physician recommends that you continue on your current medications as directed. Please refer to the Current Medication list given to you today. Prescriptions: ATENOLOL 50 MG TABS (ATENOLOL) Take one tablet by mouth twice a day  #180 x 3   Entered by:   Julaine Hua, CMA   Authorized by:   Renella Cunas, MD,  [facility]   Signed by:   Julaine Hua, CMA on 11/28/2010   Method used:   Electronically to        [facility]  [phone]* (retail)       [place], [place]  [address]       Ph: [phone] or [phone]       Fax: [phone]   RxID:   [id number]

## 2011-01-31 NOTE — Letter (Signed)
Summary: Cardiac Catheterization Instructions- [place] [place]  [id number] [place]   [place], [place] [address]   Phone: [phone]  Fax: [phone]     03/21/2010 MRN: [id number]  [name] [address] [place],   [address]  Dear [name],   You are scheduled for a Cardiac Catheterization on _________ with Dr.____STUCKEY__________  Please arrive to the 1st floor of the Heart and V[facility] Center at [facility] at _____ am / pm on the day of your procedure. Please do not arrive before 6:30 a.m. Call the Heart and Vascular Center at [phone] if you are unable to make your appointmnet. The Code to get into the parking garage under the building is__9000______. Take the elevators to the 1st floor. You must have someone to drive you home. Someone must be with you for the first 24 hours after you arrive home. Please wear clothes that are easy to get on and off and wear slip-on shoes. Do not eat or drink after midnight except water with your medications that morning. Bring all your medications and current insurance cards with you.  __X_ DO NOT take these medications before your procedure: _______HOLD FUROSEMIDE AM OF TEST AND METFORMIN 24 HOURS BEFORE_AND 8 HOURS AFTER_________DRINK PLENTY OF FLUIDS PM BEFORE  PROCEDURE_______________________________________________  __X_ Make sure you take your aspirin.  ___ You may take ALL of your medications with water that morning. ________________________________________________________________________________________________________________________________  ___ DO NOT take ANY medications before your procedure.  ___ Pre-med instructions:  ________________________________________________________________________________________________________________________________  The usual length of stay after your procedure is 2 to 3 hours. This can vary.  If you have any questions,  please call the office at the number listed above.   Devra Dopp, LPN

## 2011-01-31 NOTE — Letter (Signed)
Summary: [facility]  [facility]   Imported By: Laural Benes 09/18/2009 10:42:05  _____________________________________________________________________  External Attachment:    Type:   Image     Comment:   External Document

## 2011-01-31 NOTE — Assessment & Plan Note (Signed)
Summary: rov per dr Chadrick Sprinkle/mm   Reason for Visit follow up bump on head  History of Present Illness: [name] is a 70 year old male diabetic, who comes in today for follow-up of cellulitis of his right face.  He was seen yesterday with the above problem.  He was given a gram of Rocephin IM and started on Augmentin 500 mg b.i.d.  It comes back stating the pain and redness has diminished, but not not gone.  It's come back on his diabetic diet.  Blood sugar this morning, fasting 130.  No side effects from medicines  Allergies: No Known Drug Allergies  Review of Systems      See HPI  Physical Exam  General:  Well-developed,well-nourished,in no acute distress; alert,appropriate and cooperative throughout examination Skin:  there is still erythema.  The right, 4 and above the right eye, however, the erythema on the right side of the face is gone.   Impression & Recommendations:  Problem # 1:  CELLULITIS, FACE, RIGHT (ICD-682.0) Assessment Improved  His updated medication list for this problem includes:    Augmentin 500-125 Mg Tabs (Amoxicillin-pot clavulanate) .[name]... Take 1 tablet by mouth two times a day  Complete Medication List: 1)  Klor-con M20 20 Meq Tbcr (Potassium chloride crys cr) .... 2 tabs qam  2 tabs at bedtime;cpx when due 2)  Atenolol 100 Mg Tabs (Atenolol) .[name].. 1 tab two times a day;cpx when due 3)  Zestril 40 Mg Tabs (Lisinopril) .... Two qam 4)  Furosemide 40 Mg Tabs (Furosemide) .... Take 1 am & noon 5)  Simvastatin 20 Mg Tabs (Simvastatin) .... Take 1 tab by mouth at bedtime ;cpx when due 6)  Glipizide 10 Mg Tabs (Glipizide) .... Take one tablet twice daily-need to schedule appt for cpx 7)  Bufferin 325 Mg Tabs (Aspirin buf(cacarb-mgcarb-mgo)) .... Once daily 8)  Metformin Hcl 1000 Mg Tabs (Metformin hcl) .... Two times a day 9)  Amlodipine Besylate 10 Mg Tabs (Amlodipine besylate) .... Once daily 10)  Ketoconazole 2 % Crea (Ketoconazole) .... Apply two times a  day 11)  Onetouch Ultra Test Strp (Glucose blood) .... Test once daily or as directed by doctor 12)  Omeprazole 20 Mg Cpdr (Omeprazole) .... Take one tab once daily 13)  Augmentin 500-125 Mg Tabs (Amoxicillin-pot clavulanate) .... Take 1 tablet by mouth two times a day  Patient Instructions: 1)  continue the medication until the erythema is gone.  Remembered to stop the medication and call us immediately if you get diarrhea.  Return p.r.n. continue diabetic diet and intensive blood sugar control

## 2011-01-31 NOTE — Progress Notes (Signed)
Summary: Medications questions   Phone Note Call from Patient Call back at [phone]   Caller: Patient Summary of Call: Pt calling questions about medications Initial call taken by: Delsa Sale,  November 15, 2010 1:16 PM  Follow-up for Phone Call        Mr. Lemus calls with a heart rate of 37 - 41.  He is asymptomatic.  BP is 140/55- 150/60.   He started the lower dose of  Atenolol on 11/14.  He states his heart rate had been 54 - 60 up until today. He has not taken any of his blood pressure medications today. I will forward to Dr. Verl Blalock for review. Horton Chin RN     Appended Document: Medications questions his heart rate is greater than this, his monitor does not pick up PVCs  Appended Document: Medications questions Mr. Sheahan will have Romie Minus count his pulse for a minute.  He will call back if it is less than 50.  He is aware his machine may not be picking up the pvcs. Reassurance given and he will take his medications. Horton Chin RN

## 2011-01-31 NOTE — Assessment & Plan Note (Signed)
Summary: f1y/ gd      Allergies Added: ! ADHESIVE TAPE  Visit Type:  1 yr f/u Primary Provider:  Dorena Cookey MD  CC:  pt states he mowed his lawn 1 wk and had some chest discomfort while pushing the mower...some sob...pt states he stays cold all the time....pt also states as soon as he sits down in the evening he falls asleep.though says he has not been going to bed before 12am for awhile.  History of Present Illness: [name] comes in today for evaluation and management of coronary disease.  The last week or so, he did have exertional chest tightness all across his chest. It does not radiate. He does have shortness of breath. No nausea vomiting or diaphoresis.  He's now had several episodes of exertional chest tightness. He's had no rest or nocturnal pain.  He presented initially with dyspnea on exertion but no chest pain. At that time he had a stent placed to the LAD had a residual 60% and a diagonal branch as well as nonobstructive disease in his right coronary artery and circumflex. His symptoms markedly improved at that time. Prior to the stent, he had a stress Myoview that was positive for lateral and apical ischemia. This improved after the stent procedure.  Clinical Reports Reviewed:  Nuclear ETT:  01/20/2008: Nuclear ETT Comments:  Normal Tc-63m sestamibi myocardial perfusion study:  Nuclear ETT Findings:  EF 62% No ischemia  06/17/2007: Nuclear ETT Findings:  EF 62% Lateral apical ischemia   Current Medications (verified): 1)  Klor-Con M20 20 Meq  Tbcr (Potassium Chloride Crys Cr) .... 2 Tabs Qam  2 Tabs At Bedtime;cpx When Due 2)  Atenolol 100 Mg  Tabs (Atenolol) .[name].. 1 Tab Two Times A Day;cpx When Due 3)  Zestril 40 Mg  Tabs (Lisinopril) .... Two Qam 4)  Furosemide 40 Mg  Tabs (Furosemide) .... Take 1 Am & Noon 5)  Simvastatin 20 Mg  Tabs (Simvastatin) .... Take 1 Tab By Mouth At Bedtime ;cpx When Due 6)  Glipizide 10 Mg  Tabs (Glipizide) .... Take One  Tablet Twice Daily-Need To Schedule Appt For Cpx 7)  Bufferin 325 Mg  Tabs (Aspirin Buf(Cacarb-Mgcarb-Mgo)) .... Once Daily 8)  Metformin Hcl 1000 Mg  Tabs (Metformin Hcl) .... Two Times A Day 9)  Amlodipine Besylate 10 Mg  Tabs (Amlodipine Besylate) .... Once Daily 10)  Ketoconazole 2 %  Crea (Ketoconazole) .... Apply Two Times A Day 11)  Onetouch Ultra Test  Strp (Glucose Blood) .... Test Once Daily or As Directed By Doctor 12)  Omeprazole 20 Mg Cpdr (Omeprazole) .... Take One Tab Once Daily 13)  Augmentin 500-125 Mg Tabs (Amoxicillin-Pot Clavulanate) .... Take 1 Tablet By Mouth Two Times A Day  Allergies (verified): 1)  ! Adhesive Tape  Past History:  Past Medical History: Last updated: 03/15/2010 CAD, NATIVE VESSEL (ICD-414.01) HYPERLIPIDEMIA (ICD-272.4) HYPERTENSION (ICD-401.9) BRADYCARDIA (ICD-427.89) CEREBROVASCULAR ACCIDENT, HX OF (ICD-V12.50) OBESITY (ICD-278.00) DIABETES MELLITUS, TYPE II (ICD-250.00) CELLULITIS, FACE, RIGHT (ICD-682.0) ERECTILE DYSFUNCTION (ICD-302.72) CERUMEN IMPACTION, RIGHT (ICD-380.4) OTITIS EXTERNA, ACUTE, RIGHT (ICD-380.12)    Past Surgical History: Last updated: 03/15/2010 CVA(htn)  Family History: Last updated: 15-May-2008  father died at 65, peptic ulcer diseasemother died 104, congestive heart failure  3 brothers, one died of lung cancer, one died of COPD, once alive, but has arthritisone sister, 27s, congestive heart failure  Social History: Last updated: 01/18/2009 Occupation:[employer] Never Smoked Alcohol use-no Drug use-no Regular exercise-yes Married   Risk Factors: Exercise:  yes (04/15/2008)  Risk Factors: Smoking Status: never (04/15/2008)  Review of Systems       negative other than in the history of present illness.  Vital Signs:  Patient profile:   70 year old male Height:      69 inches Weight:      267 pounds BMI:     39.57 Pulse rate:   56 / minute Pulse rhythm:   irregular BP sitting:   130  / 60  (left arm) Cuff size:   large  Vitals Entered By: Julaine Hua, CMA (March 21, 2010 4:11 PM)  Physical Exam  General:  obese.   Head:  normocephalic and atraumatic Eyes:  PERRLA/EOM intact; conjunctiva and lids normal. Neck:  Neck supple, no JVD. No masses, thyromegaly or abnormal cervical nodes. Chest [name]:  no deformities or breast masses noted Lungs:  Clear bilaterally to auscultation and percussion. Heart:  Non-displaced PMI, chest non-tender; regular rate and rhythm, S1, S2 without murmurs, rubs or gallops. Carotid upstroke normal, no bruit. Normal abdominal aortic size, no bruits. Femorals normal pulses, no bruits. Pedals normal pulses. No edema, no varicosities. Abdomen:  Bowel sounds positive; abdomen soft and non-tender without masses, organomegaly, or hernias noted. No hepatosplenomegaly. Msk:  Back normal, normal gait. Muscle strength and tone normal. Extremities:  trace left pedal edema and trace right pedal edema.   Neurologic:  Alert and oriented x 3. Skin:  Intact without lesions or rashes. Psych:  Normal affect.   Problems:  Medical Problems Added: 1)  Dx of Angina, Stable/exertional  (ICD-413.9)  Impression & Recommendations:  Problem # 1:  ANGINA, STABLE/EXERTIONAL (ICD-413.9) Assessment New  His updated medication list for this problem includes:    Atenolol 100 Mg Tabs (Atenolol) .[name]... 1 tab two times a day;cpx when due    Zestril 40 Mg Tabs (Lisinopril) .[name]..[name] Two qam    Bufferin 325 Mg Tabs (Aspirin buf(cacarb-mgcarb-mgo)) ..... Once daily    Amlodipine Besylate 10 Mg Tabs (Amlodipine besylate) ..... Once daily  Orders: EKG w/ Interpretation (93000)  Problem # 2:  CAD, NATIVE VESSEL (ICD-414.01) Assessment: Deteriorated  His updated medication list for this problem includes:    Atenolol 100 Mg Tabs (Atenolol) .[name]... 1 tab two times a day;cpx when due    Zestril 40 Mg Tabs (Lisinopril) .[name]..[name] Two qam    Bufferin 325 Mg Tabs (Aspirin  buf(cacarb-mgcarb-mgo)) ..... Once daily    Amlodipine Besylate 10 Mg Tabs (Amlodipine besylate) ..... Once daily  His updated medication list for this problem includes:    Atenolol 100 Mg Tabs (Atenolol) .[name]... 1 tab two times a day;cpx when due    Zestril 40 Mg Tabs (Lisinopril) .[name]..[name] Two qam    Bufferin 325 Mg Tabs (Aspirin buf(cacarb-mgcarb-mgo)) ..... Once daily    Amlodipine Besylate 10 Mg Tabs (Amlodipine besylate) ..... Once daily  Problem # 3:  BRADYCARDIA (ICD-427.89) Assessment: Improved  His updated medication list for this problem includes:    Atenolol 100 Mg Tabs (Atenolol) .[name]... 1 tab two times a day;cpx when due    Zestril 40 Mg Tabs (Lisinopril) .[name]..[name] Two qam    Bufferin 325 Mg Tabs (Aspirin buf(cacarb-mgcarb-mgo)) ..... Once daily    Amlodipine Besylate 10 Mg Tabs (Amlodipine besylate) ..... Once daily  His updated medication list for this problem includes:    Atenolol 100 Mg Tabs (Atenolol) .[name]... 1 tab two times a day;cpx when due    Zestril 40 Mg Tabs (Lisinopril) .[name]..[name] Two qam    Bufferin 325 Mg  Tabs (Aspirin buf(cacarb-mgcarb-mgo)) ..... Once daily    Amlodipine Besylate 10 Mg Tabs (Amlodipine besylate) ..... Once daily  Orders: TLB-PTT (85730-PTTL) TLB-PT (Protime) (85610-PTP)  Problem # 4:  HYPERTENSION (ICD-401.9) Assessment: Unchanged  His updated medication list for this problem includes:    Atenolol 100 Mg Tabs (Atenolol) .[name]... 1 tab two times a day;cpx when due    Zestril 40 Mg Tabs (Lisinopril) .[name]..[name] Two qam    Furosemide 40 Mg Tabs (Furosemide) .[name]... Take 1 am & noon    Bufferin 325 Mg Tabs (Aspirin buf(cacarb-mgcarb-mgo)) ..... Once daily    Amlodipine Besylate 10 Mg Tabs (Amlodipine besylate) ..... Once daily  Orders: TLB-BMP (Basic Metabolic Panel-BMET) (99991111) TLB-CBC Platelet - w/Differential (85025-CBCD)  His updated medication list for this problem includes:    Atenolol 100 Mg Tabs (Atenolol) .[name]... 1 tab two times a day;cpx when  due    Zestril 40 Mg Tabs (Lisinopril) .[name]..[name] Two qam    Furosemide 40 Mg Tabs (Furosemide) .[name]... Take 1 am & noon    Bufferin 325 Mg Tabs (Aspirin buf(cacarb-mgcarb-mgo)) ..... Once daily    Amlodipine Besylate 10 Mg Tabs (Amlodipine besylate) ..... Once daily  Problem # 5:  CEREBROVASCULAR ACCIDENT, HX OF (ICD-V12.50) Assessment: Deteriorated His last carotid Doppler showed slight progression in the right internal carotid artery. He is now in the 60-79% range. His left internal carotid was stable at 40-59%. He is antegrade flow in both vertebrals. He'll need followup carotid Dopplers in May.  Problem # 6:  HYPERLIPIDEMIA (B2193296.4)  His updated medication list for this problem includes:    Simvastatin 20 Mg Tabs (Simvastatin) .[name]... Take 1 tab by mouth at bedtime ;cpx when due  His updated medication list for this problem includes:    Simvastatin 20 Mg Tabs (Simvastatin) .[name]... Take 1 tab by mouth at bedtime ;cpx when due  Patient Instructions: 1)  Your physician recommends that you schedule a follow-up appointment in: AFTER CATH 2)  Your physician recommends that you continue on your current medications as directed. Please refer to the Current Medication list given to you today. 3)  Your physician has requested that you have a cardiac catheterization.  Cardiac catheterization is used to diagnose and/or treat various heart conditions. Doctors may recommend this procedure for a number of different reasons. The most common reason is to evaluate chest pain. Chest pain can be a symptom of coronary artery disease (CAD), and cardiac catheterization can show whether plaque is narrowing or blocking your heart's arteries. This procedure is also used to evaluate the valves, as well as measure the blood flow and oxygen levels in different parts of your heart.  For further information please visit [email or web address].  Please follow instruction sheet, as given.

## 2011-01-31 NOTE — Miscellaneous (Signed)
Summary: test strips  Clinical Lists Changes  Medications: Added new medication of ONETOUCH ULTRA TEST  STRP (GLUCOSE BLOOD) test once daily or as directed by doctor

## 2011-01-31 NOTE — Letter (Signed)
Summary: [facility]  [facility]   Imported By: Laural Benes 10/12/2009 14:57:51  _____________________________________________________________________  External Attachment:    Type:   Image     Comment:   External Document

## 2011-01-31 NOTE — Assessment & Plan Note (Signed)
Summary: FLU SHOT/VN   Impression & Recommendations:  Problem # 1:  Preventive Health Care (ICD-V70.0) lot [id number], EXP 30 jun 09, sanofi pasteur left deltoid IM, 0.5 cc.  =lot [id number], EXP 30 jun 09, sanofi pasteur left deltoid IM, 0.5 cc.   Other Orders: Influenza Vaccine MCR [id number]) Nurse Visit       Influenza Vaccine    Vaccine Type: Fluvax MCR    Given by: Vilinda Blanks, RN  Flu Vaccine Consent Questions    Do you have a history of severe allergic reactions to this vaccine? no    Any prior history of allergic reactions to egg and/or gelatin? no    Do you have a sensitivity to the preservative Thimersol? no    Do you have a past history of Guillan-Barre Syndrome? no    Do you currently have an acute febrile illness? no    Have you ever had a severe reaction to latex? no    Vaccine information given and explained to patient? yes   Orders Added: 1)  Influenza Vaccine MCR [id number]    ][CPOE A&P-CCC]

## 2011-01-31 NOTE — Progress Notes (Signed)
Summary: RE PULSE   Phone Note Call from Patient Call back at Work Phone [phone] Call back at [phone]   Caller: Patient Reason for Call: Talk to Nurse Summary of Call: PT [place] 52 TO 33. NO CHEST PAIN NO SOB. Initial call taken by: Regan Lemming,  November 09, 2010 2:13 PM  Follow-up for Phone Call        I spoke with the pt. He states that he checked his pulse today and has gotten readings anywhere from 33-47 bpm. He states he has noticed readings in the 40's before. He has been only briefly lightheaded. HIs bp readings today have been 140-150/60-66. The pt is checking this with an automatic cuff. I explained he may be having some premature beats that his monitor is not picking up on. He is on atenolol 100mg  two times a day. He took his am dose about 9:00am today. I have explained to him to check his HR's prior to taking his atenolol tonight and through the weekend. If his pulse is less than 60bpm, he will hold atenolol. He is to f/u with Dr. Verl Blalock on 11/14. He has also been instructed should he hold his atenolol and his HR are still low and he does not feel well, he should report to the ER.  Follow-up by: Alvis Lemmings, RN, BSN,  November 09, 2010 2:33 PM

## 2011-01-31 NOTE — Letter (Signed)
Summary: Colonoscopy Letter  [facility]  [place], [place] [address]   Phone: [phone]  Fax: [phone]      October 04, 2010 MRN: [id number]   [facility] [address] [place], [place]  [address]   Dear Mr. [name],   According to your medical record, it is time for you to schedule a Colonoscopy. The American Cancer Society recommends this procedure as a method to detect early colon cancer. Patients with a family history of colon cancer, or a personal history of colon polyps or inflammatory bowel disease are at increased risk.  This letter has been generated based on the recommendations made at the time of your procedure. If you feel that in your particular situation this may no longer apply, please contact our office.  Please call our office at [phone] to schedule this appointment or to update your records at your earliest convenience.  Thank you for cooperating with [place] to provide you with the very best care possible.   Sincerely,  Docia Chuck. Henrene Pastor, M.D.  [facility] [phone]

## 2011-01-31 NOTE — Assessment & Plan Note (Signed)
Summary: ?ear infection/cjr   Vital Signs:  Patient profile:   70 year old male Height:      69 inches Weight:      265 pounds BMI:     39.28 Temp:     98.0 degrees F oral BP sitting:   140 / 80  (left arm) Cuff size:   regular  Vitals Entered By: Westley Hummer CMA Deborra Medina) (August 09, 2009 10:36 AM)  Reason for Visit right ear pain   History of Present Illness: [name] is a 70 year old male, who comes in today for evaluation of right-sided ear pain x 1 day.  He says he has soreness in his right ear and of the jaw and has decreased hearing.  Allergies (verified): No Known Drug Allergies  Past History:  Past medical, surgical, family and social histories (including risk factors) reviewed for relevance to current acute and chronic problems.  Past Medical History: Reviewed history from 01/18/2009 and no changes required. Diabetes mellitus, type II Hypertension ED Cerebrovascular accident, hx of 03 CAD Hyperlipidemia  Past Surgical History: Reviewed history from 05/28/2007 and no changes required. CVA(htn)  Family History: Reviewed history from 04/15/2008 and no changes required.  father died at 56, peptic ulcer diseasemother died 71, congestive heart failure  3 brothers, one died of lung cancer, one died of COPD, once alive, but has arthritisone sister, 2s, congestive heart failure  Social History: Reviewed history from 01/18/2009 and no changes required. Occupation:[employer] Never Smoked Alcohol use-no Drug use-no Regular exercise-yes Married   Review of Systems      See HPI  Physical Exam  General:  Well-developed,well-nourished,in no acute distress; alert,appropriate and cooperative throughout examination Ears:  left ear normal right ear canal swollen, red, with wax wax was removed via suction by me   Impression & Recommendations:  Problem # 1:  CERUMEN IMPACTION, RIGHT (ICD-380.4) Assessment New  Orders: Cerumen Impaction Removal  [id number])  Problem # 2:  OTITIS EXTERNA, ACUTE, RIGHT (ICD-380.12) Assessment: New  His updated medication list for this problem includes:    Cortisporin 3.5-10000-1 Soln (Neomycin-polymyxin-hc) .[name]... 1 gtt r ear two times a day x 1 week  Complete Medication List: 1)  Klor-con M20 20 Meq Tbcr (Potassium chloride crys cr) .... 2 tabs qam  2 tabs at bedtime;cpx when due 2)  Atenolol 100 Mg Tabs (Atenolol) .[name].. 1 tab two times a day;cpx when due 3)  Zestril 40 Mg Tabs (Lisinopril) .... Two qam 4)  Furosemide 40 Mg Tabs (Furosemide) .... Take 1 am & noon 5)  Simvastatin 20 Mg Tabs (Simvastatin) .... Take 1 tab by mouth at bedtime ;cpx when due 6)  Glipizide 10 Mg Tabs (Glipizide) .... Take one tablet twice daily-need to schedule appt for cpx 7)  Bufferin 325 Mg Tabs (Aspirin buf(cacarb-mgcarb-mgo)) .... Once daily 8)  Metformin Hcl 1000 Mg Tabs (Metformin hcl) .... Two times a day 9)  Amlodipine Besylate 10 Mg Tabs (Amlodipine besylate) .... Once daily 10)  Ketoconazole 2 % Crea (Ketoconazole) .... Apply two times a day 11)  Onetouch Ultra Test Strp (Glucose blood) .... Test once daily or as directed by doctor 12)  Cortisporin 3.5-10000-1 Soln (Neomycin-polymyxin-hc) .[name].. 1 gtt r ear two times a day x 1 week  Patient Instructions: 1)  See your eye doctor yearly to check for diabetic eye damage. 2)  put one drop of the medicated eardrops in your right ear canal twice a day for one week.  Return p.r.n..  In the future don't  use Q-tips Prescriptions: CORTISPORIN 3.5-10000-1 SOLN (NEOMYCIN-POLYMYXIN-HC) 1 gtt R ear two times a day x 1 week  #10 cc x 2   Entered and Authorized by:   Dorena Cookey MD   Signed by:   Dorena Cookey MD on 08/09/2009   Method used:   Electronically to        [facility]  [phone]* (retail)       [place], [place]  [address]       Ph: [phone] or [phone]       Fax: [phone]   RxID:   [id number]

## 2011-01-31 NOTE — Assessment & Plan Note (Signed)
Summary: reck diabetes/jls   Vital Signs:  Patient Profile:   70 Years Old Male Weight:      256 pounds Temp:     98.0 degrees F BP sitting:   170 / 74  Vitals Entered By: Karel Jarvis RN (April 15, 2008 11:27 AM)                 Chief Complaint:  follow up on diabetes--sugar drops to 50"s sometimes.  History of Present Illness: [name] is a 70 year old male, who has underlying obesity, diabetes, hypertension, who comes in today because his blood sugars dropped too low.  He has lost 40 pounds in the past 12 months.  He's been on a diet and exercise program sponsored by [facility].  His blood sugars on Glucotrol 10 mg b.i.d. a running 5060 and asymptomatic.  He had lab work it [facility] last week is not seen.  The results.  Is overdue for his examination with [place].    Current Allergies (reviewed today): No known allergies   Past Medical History:    Reviewed history from 05/28/2007 and no changes required:       Diabetes mellitus, type II       Hypertension       ED   Family History:    Reviewed history and no changes required:              father died at 68, peptic ulcer diseasemother died 67, congestive heart failure              3 brothers, one died of lung cancer, one died of COPD, once alive, but has arthritisone sister, 39s, congestive heart failure  Social History:    Reviewed history and no changes required:       Occupation:       Married       Never Smoked       Alcohol use-no       Drug use-no       Regular exercise-yes   Risk Factors:  Tobacco use:  never Drug use:  no Alcohol use:  no Exercise:  yes   Review of Systems      See HPI   Physical Exam  General:     Well-developed,well-nourished,in no acute distress; alert,appropriate and cooperative throughout examination Head:     Normocephalic and atraumatic without obvious abnormalities. No apparent alopecia or balding.    Impression &  Recommendations:  Problem # 1:  DIABETES MELLITUS, TYPE II (ICD-250.00) Assessment: Improved  His updated medication list for this problem includes:    Zestril 40 Mg Tabs (Lisinopril) .[name]..[name] Two qam    Glipizide 10 Mg Tabs (Glipizide) .[name]... Take one tablet twice daily-need to schedule appt for cpx    Bufferin 325 Mg Tabs (Aspirin buf(cacarb-mgcarb-mgo)) ..... Once daily    Metformin Hcl 1000 Mg Tabs (Metformin hcl) .[name]..[name] Two times a day   Complete Medication List: 1)  Klor-con M20 20 Meq Tbcr (Potassium chloride crys cr) .... 2 tabs qam  2 tabs at bedtime;cpx when due 2)  Atenolol 100 Mg Tabs (Atenolol) .[name].. 1 tab two times a day;cpx when due 3)  Zestril 40 Mg Tabs (Lisinopril) .... Two qam 4)  Furosemide 40 Mg Tabs (Furosemide) .... Take 1 am & noon 5)  Simvastatin 20 Mg Tabs (Simvastatin) .... Take 1 tab by mouth at bedtime ;cpx when due 6)  Glipizide 10 Mg Tabs (Glipizide) .... Take one tablet twice daily-need  to schedule appt for cpx 7)  Bufferin 325 Mg Tabs (Aspirin buf(cacarb-mgcarb-mgo)) .... Once daily 8)  Metformin Hcl 1000 Mg Tabs (Metformin hcl) .... Two times a day 9)  Amlodipine Besylate 5 Mg Tabs (Amlodipine besylate) .... Once daily 10)  Ticlid 250 Mg Tabs (Ticlopidine hcl) .... Bid   Patient Instructions: 1)  decrease the Glucotrol to 10 mg once a day.  Measure a fasting blood sugar every morning.  Seven appointment for the next two to 4 weeks to come back for a physical exam.  Come in for that exam.  Fasting so if we need any other lab work.  We will do it at the same time we do your checkup    ]

## 2011-01-31 NOTE — Progress Notes (Signed)
  Phone Note Outgoing Call   Call placed by: jat Call placed to: Patient

## 2011-01-31 NOTE — Cardiovascular Report (Signed)
Summary: Pre Cath Orders  Pre Cath Orders   Imported By: Sallee Provencal 03/28/2010 14:31:27  _____________________________________________________________________  External Attachment:    Type:   Image     Comment:   External Document

## 2011-01-31 NOTE — Letter (Signed)
Summary: [facility]  [facility]   Imported By: Laural Benes 09/05/2009 14:00:00  _____________________________________________________________________  External Attachment:    Type:   Image     Comment:   External Document

## 2011-03-05 ENCOUNTER — Other Ambulatory Visit: Payer: Self-pay | Admitting: Family Medicine

## 2011-03-24 LAB — POCT I-STAT GLUCOSE
Glucose, Bld: 173 mg/dL — ABNORMAL HIGH (ref 70–99)
Operator id: 141321

## 2011-04-08 ENCOUNTER — Other Ambulatory Visit: Payer: Self-pay | Admitting: Family Medicine

## 2011-04-11 ENCOUNTER — Other Ambulatory Visit: Payer: Self-pay | Admitting: Family Medicine

## 2011-05-14 NOTE — Assessment & Plan Note (Signed)
[facility] OFFICE NOTE   NAME:Sedivy, [name]               MRN:          [id number]  DATE:06/23/2007                            DOB:          05/12/1941    Mr.  Bernhard is a delightful 70 year old married white male who  returns today for review of his stress Myoview.   I evaluated him extensively on May 29, 2007, for dyspnea on exertion.  He has multiple cardiac risk factors including type 2 diabetes which is  poorly controlled, hyperlipidemia well controlled with simvastatin,  hypertension, obesity.   Because of dyspnea on exertion, we performed an exercise stress Myoview.  He exercised for 7 minutes and 1 second with a peak heart rate of 148  beats per minute.  This is 95% of predicted maximum heart rate.  His  stress scan demonstrated an EF of 62% with an area of mild lateral  apical ischemia and some diaphragmatic attenuation.   PAST MEDICAL HISTORY:  He has no drug reactions.  He is intolerant of  ADHESIVE BANDAGES, however.  He has no dye allergy.   He does not drink alcohol.  He does not smoke.   CURRENT MEDICATIONS:  1. Glipizide 10 mg p.o. b.i.d.  2. Cartia XT 300 mg a day.  3. Potassium 40 mEq in the morning, 40 in the evening.  4. Metformin 1 g b.i.d.  5. Simvastatin 20 nightly.  6. Atenolol 100 mg p.o. b.i.d.  7. Omeprazole 20 mg p.o. q.a.m.  8. Furosemide 60 mg q.a.m.  9. Aspirin 81 mg a day.  10.Lisinopril 40 mg a day.   He had a hernia repair [id number], kidney stones in 1978.   FAMILY HISTORY:  No premature history of coronary disease in his family.   SOCIAL HISTORY:  He is married.  He has one child.  He works incredibly  hard as an Medical illustrator.  He goes to [employer].   REVIEW OF SYSTEMS:  Positive for allergies and hay fever, fatigue,  constipation, gastroesophageal reflux.  The rest of the Review of  Systems is negative.   PHYSICAL EXAMINATION:   GENERAL:  He is extremely pleasant.  He is in no  acute distress.  SKIN:  Warm and dry.  NEUROLOGIC:  He is alert and oriented.  Neurologic exam is intact.  VITAL SIGNS:  Blood pressure 154/64, pulse 53 and regular.  His weight  is 264.  He is 5 feet 9 inches.  HEENT:  Normocephalic and atraumatic.  Male balding.  Facial symmetry is  normal.  PERRLA.  Extraocular movements intact.  Sclerae clear.  NECK:  Supple.  There is no thyroid enlargement.  Trachea is midline.  Carotid upstrokes are equal bilaterally without bruits.  LUNGS: Clear to auscultation.  HEART:  Reveals non-appreciated PMI. There is a soft S1 and S2.  ABDOMEN:  Soft with good bowel sounds.  There is no obvious  organomegaly.  Obesity made this difficult.  EXTREMITIES:  Reveal no cyanosis or clubbing, but he does have 2+  pretibial edema. Pulses are intact.   LABORATORY DATA:  Pending.   Chest  x-ray is pending.   ASSESSMENT:  1. Dyspnea on exertion with lateral and apical ischemia on a stress      Myoview.  He has multiple cardiac risk factors, and I would not be      surprised if he has obstructive disease in the circumflex system.      He may have more than one-vessel disease.  2. Obesity.  3. Type 2 diabetes under poor control.  4. Hyperlipidemia under good control.  5. Sedentary lifestyle.  6. Hypertension.   I have discussed the above using a heart model with him and his wife.  I  have recommended outpatient cardiac catheterization.  Indications,  risks, and benefits have been discussed.  He agrees to proceed.  We will  plan on proceeding with this on Thursday, June 26.     Thomas C. Verl Blalock, MD, [facility]  Electronically Signed    TCW/MedQ  DD: 06/23/2007  DT: 06/23/2007  Job #: [id number]   cc:   Dellis Filbert A. Sherren Mocha, MD

## 2011-05-14 NOTE — Discharge Summary (Signed)
[name], [name]        ACCOUNT NO.:  [id number]   MEDICAL RECORD NO.:  [id number]          PATIENT TYPE:  OIB   LOCATION:  6533                         FACILITY:  [facility]   PHYSICIAN:  Thomas C. Wall, MD, FACCDATE OF BIRTH:  06-26-1941   DATE OF ADMISSION:  06/30/2007  DATE OF DISCHARGE:  07/01/2007                               DISCHARGE SUMMARY   DISCHARGE DIAGNOSES:  Coronary artery disease status post cardiac  catheterization with intervention by Dr. Lia Foyer  on June 30, 2007.  Patient with intermediate stenosis of the proximal left anterior  descending  artery, high-grade stenosis of the mid left anterior  descending artery with successful percutaneous stenting using a Cypher  drug-eluting platform with recommendations for aspirin and Plavix for  minimum of 1 year with continued aspirin after.   PAST MEDICAL HISTORY INCLUDES:  1. Surgical hernia repair in 1963.  2. Kidney stones in 1978.  3. Obesity.  4. Type 2 diabetes.  5. Hyperlipidemia.  6. Hypertension.  7. History of cerebrovascular hemorrhage:  Right cerebellar hematoma      consistent with CVA followed by Dr. Charmian Muff in 2003.   HOSPITAL COURSE:  [name] is a pleasant 70 year old Caucasian  gentleman seen by Dr. Verl Blalock on June 23, 2007 for abnormal stress Myoview,  initially seen by Dr. Verl Blalock on May 30 for dyspnea on exertion with  multiple cardiac risk factors.  The patient proceeded with stress  Myoview which was positive for apical ischemia.  Dr. Verl Blalock recommended  cardiac catheterization for further evaluation.  Proceeded with cardiac  catheterization on June 25, 2007 by Dr. Percival Spanish.  The patient found to  have an EF of 55% with normal wall motion and single vessel coronary  disease in the LAD.  The patient was started on Plavix and scheduled for  PCI.  The patient returned this admission for intervention as stated  above.  The patient tolerated the procedure without complications;  however, the  patient remaining hypertensive.  On further discussion  between Dr. Verl Blalock and Dr. Lia Foyer, it has been decided to discontinue his  diltiazem and began Norvasc 5 mg daily and follow up outpatient for  further titration of blood pressure medications.   At time of discharge, patient afebrile, blood pressure 158-183 over 64,  the heart rate 68, saturation 94% on room air.  Hemoglobin 12.5,  potassium 4.5, creatinine 0.7.  The patient was seen by Dr. Mar Daring and  then by Dr. Lia Foyer.   DISCHARGING INSTRUCTIONS:  Include post cardiac catheterization  discharge instructions.  Follow up with Dr. Rennie Natter. on July 08, 2007  at 1:45 for a post catheterization check and blood pressure appointment.  The patient will then need to follow up with Dr. Verl Blalock within the next 2  months.   MEDICATIONS AT TIME OF DISCHARGE INCLUDE:  1. Plavix 75 mg daily.  2. Aspirin 325 daily.  3. Prilosec 20 mg daily.  4. Simvastatin 20 mg daily.  5. Metformin; the patient may resume previous prescribed dose on July      4 a.m.  6. Lisinopril 40 mg daily.  7. Atenolol 100 mg b.i.d.  8. Geronimo Boot has been discontinued.  9. Glipizide 10 mg b.i.d.  10.K-Dur 40 mEq b.i.d.  11.Furosemide 60 mg daily.  12.The patient has a prescription for Norvasc 5 mg daily which is new.  13.Nitroglycerin as needed which is new.   I have also written him a prescription for the Plavix 75 mg daily for  year's refill.   Duration of discharge encounter is greater than 30 minutes.      Rosanne Sack, ACNP      Marijo Conception. Verl Blalock, MD, [facility]  Electronically Signed    MB/MEDQ  D:  07/01/2007  T:  07/01/2007  Job:  [id number]   cc:   Dellis Filbert A. Sherren Mocha, MD  Charmian Muff, M.D.

## 2011-05-14 NOTE — Assessment & Plan Note (Signed)
[facility] OFFICE NOTE   NAME:[name], [name]               MRN:          [id number]  DATE:01/18/2009                            DOB:          Dec 23, 1941    Mr. Creely comes in today for followup.  Other than gaining a  little bit of weight over the holidays, he is doing well.   He is still on 5 mg of amlodipine instead of 10, which I thought I had  increased him to last time.  His blood pressures have been under good  control.  His edema has been stable.   For his problem list, please refer to my note on January 12, 2008.   He has been off the ticlopidine for 6 months and is doing well without  any angina or ischemic symptoms.   CURRENT MEDICATIONS:  1. Glipizide 10 mg p.o. b.i.d.  2. Potassium 40 mEq in the morning and 40 in the evening.  3. Metformin 1000 mg p.o. b.i.d.  4. Simvastatin 20 mg p.o. at bedtime.  5. Atenolol 100 mg p.o. b.i.d.  6. Aspirin 325 mg per day.  7. Amlodipine 5 mg p.o. daily.  8. Zestril 80 mg q.a.m.  9. Furosemide 40 mg p.o. b.i.d.   His lab work is followed by Dr. Sherren Mocha.   PHYSICAL EXAMINATION:  GENERAL:  He is in no acute distress.  Alert and  oriented x3.  SKIN:  Warm and dry.  VITAL SIGNS:  Blood pressure 130/62, pulse is 54.  EKG confirms sinus  brady at the left axis and some mild LVH.  There has been no change.  His weight is up to 260 from 249.  HEENT:  Normal.  NECK:  Carotids are full.  No bruits.  Thyroid is not enlarged.  Trachea  is midline.  Neck is supple.  No lymphadenopathy.  LUNGS:  Clear to auscultation and percussion.  CHEST:  Otherwise shows poorly appreciated PMI.  Normal S1 and S2.  No  gallop.  ABDOMEN:  Soft, good bowel sounds.  No midline bruit.  No pulsatile  mass.  No hepatosplenomegaly.  EXTREMITIES:  1+ pitting edema.  Pulses are intact bilaterally.  No sign  of DVT.  Skin shows a few ecchymoses.   ASSESSMENT/PLAN:  Mr.  Bishara is doing well except for his weight  gain.  I am delighted with his blood pressure control.  His edema is  under control.  He is having no angina off the ticlopidine.   PLAN:  1. Continue current medications.  2. Weight loss.  3. See me back in a year.  At that time, he will need objective      assessment of his coronary artery disease.     Thomas C. Verl Blalock, MD, [facility]  Electronically Signed    TCW/MedQ  DD: 01/18/2009  DT: 01/18/2009  Job #: [id number]

## 2011-05-14 NOTE — Cardiovascular Report (Signed)
NAMEDOHN, WEIDERT        ACCOUNT NO.:  [id number]   MEDICAL RECORD NO.:  [id number]          PATIENT TYPE:  OIB   LOCATION:  6533                         FACILITY:  [facility]   PHYSICIAN:  Loretha Brasil. Lia Foyer, MD, FACCDATE OF BIRTH:  03/14/1941   DATE OF PROCEDURE:  06/30/2007  DATE OF DISCHARGE:                            CARDIAC CATHETERIZATION   INDICATIONS:  Mr. Pregler is a very delightful 70 year old  gentleman who presents with a history of dyspnea of effort.  His  medicines have been adjusted. 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 Cardiac catheterization by Dr. Percival Spanish revealed  a segmental area of disease in the mid left anterior descending artery  subtending two smaller diagonal branches.  Diagnostic study was done by  Dr. Percival Spanish, and he and Dr. Albertine Patricia reviewed the films.  Percutaneous  stenting of the mid left anterior descending artery was recommended  because of the patient's symptoms, the nuclear defect, and the fact that  the LAD is really quite large and supplies a significant portion of the  inferior wall.  Risks, benefits and alternatives were discussed with the  patient, and he consented to proceed.   DESCRIPTION OF PROCEDURE:  The patient was brought to the  catheterization laboratory and prepped and draped in the usual fashion.  Through an anterior puncture, the left femoral artery was easily  entered.  A 6-French sheath was placed. Bivalirudin was given according  to protocol. Chewable aspirin had been given more than 1 hour prior to  the procedure.  A JL-3.5 guiding catheter was utilized to intubate the  left main, and multiple views were obtained to lay out the proximal left  anterior descending artery which demonstrates about 50-60% ostial  stenosis.  Bivalirudin was given according to protocol.  We then used a  BMW wire to cross the lesion, and predilatations were done with a 2.0,  2.25, and 2.5 mm Maverick balloons.   Following this, we carefully passed  a Cypher drug-eluting stent down the vessel. The Cypher stent was  deployed at about 13-14 atmospheres, and then post dilated using a 3 mm  Quantum Maverick balloon.  There was marked improvement in the  appearance of the artery.  There were two moderate-size side branches  which came out from the LAD lesion.  The second side branch had 70%  narrowing at the beginning of the procedure. At the completion of  procedure, there was about 90% narrowing with what appeared to be  slightly reduced flow.  However, the flow improved after intracoronary  nitroglycerin. Final views were obtained. The femoral sheath was sewn in  place.  The patient was taken to the holding area in satisfactory  clinical condition.   ANGIOGRAPHIC DATA:  The left main coronary artery demonstrates some  calcification at its distal-most aspect.  It tapers to about 30%. The  ostium of the LAD then has about 50-60% calcified narrowing.  The mid  LAD is mildly calcified but without critical disease. Just after the  septal perforator, there becomes a segmental stenosis that measures  about 80% overlapping the origin of two diagonal branches.  There is  some compromise  of both diagonal branches. The distal LAD has some  luminal irregularity and wraps the apical tip. The diagonal itself,  which is the second diagonal, following balloon and stenting did  demonstrate a reduction to about TIMI-2+ flow, but it was clearly  improved from the post stent implant, and appeared to be gradually  improving after the administration of intracoronary nitroglycerin.   CONCLUSIONS:  1. Intermediate stenosis of the proximal left anterior descending      artery as described.  2. High-grade stenosis of the mid left anterior descending artery with      successful percutaneous stenting using a Cypher drug-eluting      platform.   DISPOSITION:  The patient will need to follow up carefully with Dr.  Verl Blalock. We  will likely cut back on his Cartia as his heart rate is still  slow, and we will add Norvasc to his regimen. Aspirin and Plavix for  minimum of 1 year with continuous aspirin after that would be  recommended.  Consideration may be given to continuing Plavix beyond  that time based on the desire of the consulting cardiologist.      Loretha Brasil. Lia Foyer, MD, [facility]  Electronically Signed     TDS/MEDQ  D:  06/30/2007  T:  06/30/2007  Job:  [id number]   cc:   Dellis Filbert A. Sherren Mocha, MD  [place] Verl Blalock, MD, [facility]  Minus Breeding, MD, [facility]  CV Laboratory

## 2011-05-14 NOTE — Assessment & Plan Note (Signed)
[facility] OFFICE NOTE   NAME:Maynez, TARQUIN HAPP               MRN:          [id number]  DATE:07/05/2008                            DOB:          [birth year]    Mr. Teixeira returns today.  He is having some chest pain which is  well localized over his left upper sternum.  He sometimes has a little  ache in his left arm.  It is not associated with exertion, in fact it is  spontaneous.  It is not related to position.  He denies any fever,  chills, sweats, productive cough, or hemoptysis.   His dyspnea on exertion which is his angina equivalent has not  increased.  He had a negative stress Myoview for ischemia on January 20, 2008.  His initial coronary vessel blockage was picked up by stress  Myoview since he was relatively asymptomatic.   He has lost an additional 5 pounds.  His blood pressure has been running  around 0000000 systolic at home.   His medications are unchanged since his last visit.   PHYSICAL EXAMINATION:  His blood pressure today is 144/68, his pulse 60  and regular, and his weight is 249.  HEENT:  Unchanged.  Carotid upstrokes are equal bilaterally without bruits.  No JVD.  Thyroid is not enlarged.  Trachea is midline.  LUNGS:  Clear.  HEART:  Poorly appreciated PMI.  Normal S1 and S2.  ABDOMEN:  Soft.  Good bowel sounds.  Organomegaly could not be assessed.  EXTREMITIES:  Only trace edema.  Pulses are intact.  NEUROLOGIC:  Intact.   Mr. Vasas has now lost an additional 5 pounds.  His blood  pressure is still mildly elevated particularly with his diabetes.  I  will increase his amlodipine to 10 mg a day from 5 mg a day.  In  addition, we can now stop his ticlopidine.  I will see him back again in  6 months.   He has followup visit with his primary care doctor, Dr. Stevie Kern,  next week.     Thomas C. Verl Blalock, MD, [facility]  Electronically Signed    TCW/MedQ  DD:  07/05/2008  DT: 07/06/2008  Job #: [id number]

## 2011-05-14 NOTE — Assessment & Plan Note (Signed)
[place] OFFICE NOTE   NAME:[name], [name]               MRN:          [id number]  DATE:10/22/2007                            DOB:          [birth year]    [name] returns today for further management of his coronary  disease.  Please see his problem list from July 21, 2007.  He is  enjoying cardiac rehab.  He has lost an additional five to six pounds  since I saw him in July.  He is having no exertional dyspnea on  exertion, which was his presenting symptom.  He has had no angina  either.   He continues to run resting heart rates in the 50s and low 60s at  cardiac rehab.  Today, he is about 52 beats per minute.  He is totally  asymptomatic with that.   His blood sugars on several occasions have been down to the 50s, when he  became kind of light-headed and felt funny.  Dr. Sherren Mocha is adjusting his  medications downward.   His medications since last visit are unchanged.  We last checked the CBC  on Ticlid on September 16, 2007.  These were within normal limits.  We  will check these again in November.   His blood pressure today is 134/70, his pulse is 52 and regular, his  weight is down to 253.  He looks remarkably good.  HEENT:  Unchanged.  His carotids are full with very soft, faint, early systolic sounds, left  greater than right.  Thyroid is not enlarged.  Trachea is midline.  LUNGS:  Clear.  HEART:  Reveals a poorly-appreciated PMI.  He has a normal S1, S2.  ABDOMINAL EXAM:  Protuberant, good bowel sounds.  EXTREMITIES:  Reveal no cyanosis, clubbing or edema.  Pulses are intact.  NEUROLOGIC EXAM:  Intact.   ASSESSMENT AND PLAN:  [name] is doing well.  I am extremely  pleased with his weight reduction and the tightening of his blood sugar.  We will check a CBC in November.  I have made no changes in his medical  program.  Empirically, he will need to be on Ticlid for a year  after his  drug eluting stent.  If the CBC is stable in November, we will check  every three months.   I will plan on seeing him back in three months.   I also will obtain a carotid Doppler study with the question of these  soft carotid bruits.    Thomas C. Verl Blalock, MD, [facility]  Electronically Signed   TCW/MedQ  DD: 10/22/2007  DT: 10/23/2007  Job #: [id number]   cc:   Dellis Filbert A. Sherren Mocha, MD

## 2011-05-14 NOTE — Assessment & Plan Note (Signed)
[facility]                            CARDIOLOGY OFFICE NOTE   NAME:Weathers, CONWELL VITO               MRN:          [id number]  DATE:01/12/2008                            DOB:          19-Oct-1941    Mr. Derryberry returns today for further management of the following  issues:  1. Coronary artery disease.  He is status post symptomatic coronary      disease with a high-grade lesion in the LAD picked up on a stress      Myoview.  He had a Cypher drug-eluting stent by Dr. Addison Lank.  2. History of significant bradycardia causing [place] to switch his Cartia      XT to amlodipine.  3. Hypertension which has harder to control.  4. Type 2 diabetes.  5. Hyperlipidemia.  6. Obesity.   His biggest complaint today is that he cannot get his blood pressure  down.  He does not have a large cuff at home.  When he was in  rehabilitation, it was pretty well controlled.   He has also had some swelling in his legs and some leg cramps.   He denies any orthopnea or PND, and he has had no ischemic symptoms.   He has been able to keep his weight down; he is actually down 1 pound  over the holidays!   His medicines are unchanged since his last visit.  He is still on  ticlopidine 250 mg p.o. b.i.d. and is due a CBC.  He has had a history  of hypokalemia, and we need to follow up on that, as well.   PHYSICAL EXAMINATION:  VITAL SIGNS:  His blood pressure today is 156/67.  I rechecked it with a large cuff in the left arm; it was 132/68.  His  pulse is 62 and regular.  His weight is 252 pounds, down a pound.  HEENT:  Unchanged.  NECK:  Carotids were equal bilaterally without bruits.  There is no JVD.  Thyroid is not enlarged.  Trachea is midline.  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  LUNGS:  Clear to auscultation.  HEART:  A nondisplaced PMI.  There is a normal S1-S2 without gallop.  PMI is hard to  appreciate.  ABDOMEN:  Protuberant with good bowel sounds.  There is no tenderness.  EXTREMITIES:  1+ edema.  Pulses are intact.   ASSESSMENT:  Mr. Mather is stable from our standpoint.  I have  made the following recommendations based on the above:  1. Increase furosemide to 40 mg p.o. b.i.d.  2. Check Chem-7 and CBC today.  3. Leg stretches and increase activity to avoid leg cramps at night.  4. Continue ticlopidine until July when I see him back.  5. Exercise rest stress Myoview off of atenolol.  6. Obtain a large cuff for measuring his blood pressures at home.  7. Continue weight control.     Thomas C. Verl Blalock, MD, [facility]  Electronically Signed    TCW/MedQ  DD: 01/12/2008  DT: 01/13/2008  Job #: [id number]   cc:   Dellis Filbert A. Sherren Mocha, MD

## 2011-05-14 NOTE — Cardiovascular Report (Signed)
NAMEMAXDEN, [name]        ACCOUNT NO.:  [id number]   MEDICAL RECORD NO.:  [id number]          PATIENT TYPE:  OIB   LOCATION:  1962                         FACILITY:  [facility]   PHYSICIAN:  Minus Breeding, MD, FACCDATE OF BIRTH:  Aug 19, 1941   DATE OF PROCEDURE:  06/25/2007  DATE OF DISCHARGE:                            CARDIAC CATHETERIZATION   PRIMARY CARE PHYSICIAN:  Dellis Filbert A. Sherren Mocha, MD   CARDIOLOGIST:  Marijo Conception. Wall, MD, [facility].   PROCEDURE:  Left heart catheterization/coronary arteriography.   INDICATIONS:  Evaluate patient with an abnormal Cardiolite suggesting  apical lateral ischemia.  He has dyspnea and multiple cardiovascular  risk factors.   PROCEDURE NOTE:  Left heart catheterization performed via the right  femoral artery.  The artery was cannulated using anterior wall puncture.  A #4-French arterial sheath was inserted via the Seldinger technique.  Preformed Judkins and pigtail catheter were utilized.  The patient  tolerated the procedure well and left the lab in stable condition.   RESULTS:  Hemodynamics LV 155/24, AO 153/62.  Coronaries:  The LAD was  calcified at the ostium.  There was 25% stenosis.  There were proximal  luminal irregularities.  There was a mid-75% stenosis. The first and  second diagonal were small.  The second diagonal had an ostial 60%  stenosis.  The circumflex in the AV groove was normal.  There was a  ramus intermediate which was small and normal.  Mid obtuse marginal was  large and normal.  The right coronary artery was dominant.  There was  proximal long 25% stenosis.  There was mid long 25% stenosis.  The PDA  was moderate size and normal.   LEFT VENTRICULOGRAM:  The left ventriculogram was obtained in the RAO  projection.  The EF was 55% with normal wall motion.   CONCLUSION:  Single-vessel coronary artery disease in the distribution  of the abnormal Cardiolite.   PLAN:  I reviewed the films with Dr. Albertine Patricia.  We have set him up  for a  PCI with Dr. Lia Foyer.  The patient will be treated with Plavix; and  continue his aspirin.  He will continue his other medications with the  exception of his metformin which he will hold following this procedure  and prior to his next.      Minus Breeding, MD, [facility]  Electronically Signed     JH/MEDQ  D:  06/25/2007  T:  06/25/2007  Job:  [id number]

## 2011-05-14 NOTE — Assessment & Plan Note (Signed)
[place] OFFICE NOTE   NAME:[name], [name]               MRN:          [id number]  DATE:07/21/2007                            DOB:          Mar 23, 1941    Mr.  [name] returns today for followup of the following issues:   1. Coronary artery disease. He presented with dyspnea on exertion and      had lateral apical ischemia on a stress Myoview. With his multiple      risk factors, we did a heart catheterization. His catheterization      showed a high-grade lesion in the mid LAD that was successfully      stented with a CYPHER drug-eluting stent by Dr.  Addison Lank on      June 30, 2007. We noted him to be significantly bradycardic in the      hospital and switched his Cartia XT to amlodipine 5 mg a day.  2. History of bradycardia.  3. Hypertension.  4. Type 2 diabetes.  5. Hyperlipidemia.   All of these subsequent problems are followed by Dr.  Sherren Mocha.   He has lost about 6 pounds since his procedure. He is starting cardiac  rehab on Thursday. He seems to be extremely motivated.   He has no angina. He has no dyspnea on exertion. He has had no pre-  syncope or syncope.   MEDICATIONS:  1. Glipizide 10 mg p.o. b.i.d.  2. Potassium 20 mEq two in the morning and two in the evening.  3. Metformin 1000 mg b.i.d.  4. Simvastatin 20 mg q nightly.  5. Atenolol 100 mg p.o. b.i.d.  6. Omeprazole 20 mg p.o. q a.m.  7. Furosemide 60 mg q a.m.  8. Ticlopidine 250 mg one b.i.d.  9. Aspirin 325 a day.  10.Amlodipine 5 mg a day.  11.Zestril 40 mg a day.   He was switched from Plavix to ticlopidine because of a pruritic rash.  CBC on July 10, 2007, shows his hemoglobin, white count, platelets to be  stable.   PHYSICAL EXAMINATION:  Blood pressure 138/74, pulse 53 and in sinus  brady. His weight is down to 258. His skin color looks remarkably  better.  HEENT: Unchanged.  Carotid upstrokes are equal  bilaterally without bruits. There is no JVD.  Thyroid is not enlarged. Trachea is midline.  LUNGS:  Are clear.  HEART: Reveals a regular rate and rhythm.  Both groin sites are stable with no sign of hematoma. He has a little  ecchymosis along the left inner thigh. There is no tenderness. Pulses  are intact. There is no edema.  NEURO: Intact.   ASSESSMENT/PLAN:  I am delighted with how [name] is doing. He  seems very motivated and I am delighted he is going to engage in Cardiac  Rehab.   I have released him to start mowing his yard. I have asked him to return  in two weeks for another CBC on ticlopidine. He will then require  another one in six weeks and then three months thereafter. He will have  to remain on this drug along with  aspirin for a year out from his stent,  July 1.   I will plan on seeing him back in October. Will probably followup with  an exercise rest/stress Myoview about 4-6 months after his stenting.     Thomas C. Verl Blalock, MD, [facility]  Electronically Signed    TCW/MedQ  DD: 07/21/2007  DT: 07/21/2007  Job #: [id number]   cc:   Dellis Filbert A. Sherren Mocha, MD

## 2011-05-14 NOTE — Assessment & Plan Note (Signed)
[name]                            CARDIOLOGY OFFICE NOTE   NAME:[name] [name], [name] [name]               MRN:          [id number]  DATE:05/29/2007                            DOB:          01/25/1941    I was asked by Dr. Christie [name] to consult on [name] [name], a  pleasant 70 year old married white male with low heart rate to 42, and  dyspnea on exertion.  [name] has also had some dizzy spells.   HISTORY OF PRESENT ILLNESS:  [name] is 70 years of age.  [name] has been  hypertensive since 1980, and [name] has had diabetes for the last 12 years.  [name] is overweight.   His Cardia XT was increased to twice a day back in the winter.  Since  that time, [name] has noticed some intermittent dizziness.   His cardiac risk factors otherwise are hyperlipidemia under good control  with simvastatin.  [name] does not smoke.  [name] is fairly sedentary.   PAST MEDICAL HISTORY:  NO DRUG REACTIONS, BUT [name] IS ALLERGIC TO ADHESIVE BANDAGES.  [name] HAS NO  DYE ALLERGIES.   [name] does not drink.  [name] drinks a lot of caffeine.   CURRENT MEDICATIONS:  1. Glipizide 10 mg p.o. b.i.d.  2. Cartia XT 300 mg p.o. b.i.d.  3. Potassium 40 mEq in the morning, 40 in the evening.  4. Metformin 1 gm b.i.d.  5. Simvastatin 20 mg nightly.  6. Atenolol 100 mg p.o. b.i.d.  7. Omeprazole 20 mg p.o. q.a.m.  8. Furosemide 60 mg q.a.m.  9. Aspirin 81 mg a day.  10.Lisinopril 40 mg a day.   [name] has had hernia repair in 1963, kidney stones in 1978.   FAMILY HISTORY:  No premature history of coronary disease in his family.   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  [name] has a lot of stress at  this job.  [name] is married, and has 1 child.  [name] goes to [employer],  which is my church.   REVIEW OF SYSTEMS:  Other than the HPI, positive for allergies and hay  fever, fatigue, constipation, gastroesophageal reflux, which seems to be  sometimes bothersome to him.  The rest of his review of systems are   negative.   EXAMINATION:  [name] is very pleasant.  [name] is in no acute distress.  SKIN:  Warm and dry.  [name] is alert and oriented.  Neurologic exam is intact.  His blood pressure is 131/66.  Pulse 60 and regular.  His EKG from Dr. Honor [name] office is normal except for a 1st degree A-V  block of 212 msec.  [name] was bradycardiac at 52.  [name] is 5 feet 9 inches,  weighs 266.  HEENT:  Normocephalic and atraumatic.  Male balding.  Facial symmetry is  normal.  PERRLA.  Extraocular movements intact.  Sclerae are clear.  Carotid upstrokes were equal bilaterally without bruits.  There is no  JVD.  Thyroid is not enlarged.  Trachea is midline.  LUNGS:  Clear to auscultation.  HEART:  Reveals a non-appreciated PMI.  [name] has a soft S1 and S2.  ABDOMINAL  EXAM:  Protuberant with good bowel sounds.  There is no  hepatomegaly.  EXTREMITIES:  Reveal no cyanosis, clubbing, but [name] does have 2+  pretibial edema.  Pulses are intact.  NEUROLOGIC:  Exam is intact.   Hemoglobin A1c was elevated at 8.  TSH was normal.  The rest of his  electrolytes were normal.  Lipids were mentioned above.  CBC was normal.   ASSESSMENT:  1. Sinus bradycardia, which is now symptomatic.  I think this is      secondary to high-dose Cartia XT, and to a lessor extent, Atenolol.  2. Dyspnea on exertion with multiple cardiac risk factors and coronary      disease equivalency with diabetes.  3. Obesity.  4. Type 2 diabetes, under poor control.  5. Hyperlipidemia, under good control on statins.  6. Sedentary lifestyle.   PLAN:  1. Exercise rest/stress Myoview off Atenolol.  2. Decrease Cartia XT to 300 mg a day.  3. Follow up with me in 3 to 4 weeks.     [name] C. Verl Blalock, MD, [facility]  Electronically Signed    [name] [name]  DD: 05/29/2007  DT: 05/29/2007  Job #: [id number]   cc:   [name] Filbert A. Sherren Mocha, MD

## 2011-05-17 NOTE — Discharge Summary (Signed)
   NAME:  [name], Jacob                  ACCOUNT NO.:  [id number]   MEDICAL RECORD NO.:  [id number]                   PATIENT TYPE:  INP   LOCATION:  3016                                 FACILITY:  [facility]   PHYSICIAN:  Hosie Spangle, M.D.            DATE OF BIRTH:  1941/11/30   DATE OF ADMISSION:  11/01/2002  DATE OF DISCHARGE:  11/07/2002                                 DISCHARGE SUMMARY   ADMISSION HISTORY:  A 70 year old man admitted by Dr. Ashok Pall because  of a small right cerebellar hematoma consistent with a stroke.  The patient  had the abrupt onset of dizziness, nausea and vomiting, was unable to walk  and had slurred speech.  He was brought by EMS to the [facility] Emergency  Room and subsequently transferred to [facility].  His primary  physician is Dr. Dellis Filbert A. [name].  Past history is notable for hypertension  and diabetes.  He then revealed dysarthric speech, no nystagmus, right-sided  dysmetria.   HOSPITAL COURSE:  The patient was admitted by Dr. Christella Noa to the intensive  care unit for observation.  He had no hydrocephalus and no ventricular  catheter was necessary.  Followup CT scan was unchanged.  Gradually, his  neurologic function improved.  At this time, he is awake, alert, oriented,  following commands and moving all extremities well.  He is afebrile.  He is  asking to be discharged to home.  He is up and ambulating in the hallways  without difficulty.   ACTIVITY:  He has been given instructions regarding activities following  discharge.   FOLLOWUP:  He is to follow up with Dr. Christella Noa in two to three weeks; he is  going to call the office to make that appointment.  We have also recommended  that he follow up with Dr. Sherren Mocha within one to two weeks regarding his blood  pressure and diabetes.   SPECIAL DISCHARGE INSTRUCTIONS:  We have recommended that he be out of work  until he sees Dr. Christella Noa, that he not drive until he sees Dr.  Christella Noa.   DISCHARGE MEDICATIONS:  He is to use Tylenol, Advair or Aleve as needed for  headache, which he denies at this time.   DISCHARGE DIAGNOSES:  Cerebellar hematoma consistent with stroke with  resolving symptomatology.                                               Hosie Spangle, M.D.    RWN/MEDQ  D:  11/07/2002  T:  11/08/2002  Job:  [id number]   cc:   Dellis Filbert A. Sherren Mocha, M.D. [facility]

## 2011-07-01 ENCOUNTER — Encounter: Payer: Self-pay | Admitting: Internal Medicine

## 2011-07-02 ENCOUNTER — Other Ambulatory Visit: Payer: Self-pay | Admitting: Family Medicine

## 2011-08-08 ENCOUNTER — Ambulatory Visit (AMBULATORY_SURGERY_CENTER): Payer: Medicare Other | Admitting: *Deleted

## 2011-08-08 VITALS — Ht 70.0 in | Wt 160.9 lb

## 2011-08-08 DIAGNOSIS — Z1211 Encounter for screening for malignant neoplasm of colon: Secondary | ICD-10-CM

## 2011-08-08 MED ORDER — PEG-KCL-NACL-NASULF-NA ASC-C 100 G PO SOLR: ORAL | Status: DC

## 2011-08-14 ENCOUNTER — Encounter: Payer: Self-pay | Admitting: Cardiology

## 2011-08-14 ENCOUNTER — Ambulatory Visit (INDEPENDENT_AMBULATORY_CARE_PROVIDER_SITE_OTHER): Payer: Medicare Other | Admitting: Cardiology

## 2011-08-14 DIAGNOSIS — I10 Essential (primary) hypertension: Secondary | ICD-10-CM

## 2011-08-14 DIAGNOSIS — I2510 Atherosclerotic heart disease of native coronary artery without angina pectoris: Secondary | ICD-10-CM

## 2011-08-14 DIAGNOSIS — R0989 Other specified symptoms and signs involving the circulatory and respiratory systems: Secondary | ICD-10-CM

## 2011-08-14 NOTE — Assessment & Plan Note (Signed)
Scheduled carotid Dopplers. Continue medication.

## 2011-08-14 NOTE — Progress Notes (Signed)
HPI [name] returns today for evaluation and management his coronary disease and carotid disease.  He is having no symptoms of angina or TIAs. His blood pressures been running in the 140-160 range the last few weeks.  He has a physical scheduled with Dr. Sherren Mocha next week an upcoming colonoscopy. He is also due for carotid Dopplers.  EKG shows normal sinus rhythm, left axis deviation, and LVH. Past Medical History  Diagnosis Date  . Blood transfusion   . Diabetes mellitus   . GERD (gastroesophageal reflux disease)   . Hyperlipidemia   . Hypertension   . CVA (cerebral infarction) 2004  . CAD (coronary artery disease)     Past Surgical History  Procedure Date  . Angioplasty 2008, 2010    stent placement 2008  . Kidney stone removal 1969  . Inguinal hernia repair 1962    right    No family history on file.  History   Social History  . Marital Status: Married    Spouse Name: N/A    Number of Children: N/A  . Years of Education: N/A   Occupational History  . Not on file.   Social History Main Topics  . Smoking status: Never Smoker   . Smokeless tobacco: Not on file  . Alcohol Use: No  . Drug Use: No  . Sexually Active: Not on file   Other Topics Concern  . Not on file   Social History Narrative  . No narrative on file    Allergies  Allergen Reactions  . Adhesive (Tape)     rash  . Clopidogrel Bisulfate     REACTION: rash, itching    Current Outpatient Prescriptions  Medication Sig Dispense Refill  . amLODipine (NORVASC) 10 MG tablet Take 10 mg by mouth daily.       [name] aspirin 325 MG tablet Take 325 mg by mouth 2 (two) times daily before a meal.        . atenolol (TENORMIN) 100 MG tablet 1 tab po bid       . furosemide (LASIX) 40 MG tablet Take 40 mg by mouth 2 (two) times daily.        [name] glipiZIDE (GLUCOTROL) 10 MG tablet Take 10 mg by mouth 2 (two) times daily before a meal.       . lisinopril (PRINIVIL,ZESTRIL) 40 MG tablet Take 40 mg by mouth  daily.       . metFORMIN (GLUCOPHAGE) 1000 MG tablet TAKE 1 TABLET TWICE DAILY -- NEED OFFICE VISIT  60 tablet  0  . Multiple Vitamin (MULTIVITAMIN) tablet Take 1 tablet by mouth daily.        [name] omeprazole (PRILOSEC) 20 MG capsule Take 20 mg by mouth as needed.       . peg 3350 powder (MOVIPREP) 100 G SOLR moviprep as directed  1 kit  0  . Potassium Chloride (KLOR-CON PO) Takes 2 tablets twice a day      . simvastatin (ZOCOR) 20 MG tablet TAKE 1 TAB BY MOUTH AT BEDTIME  90 tablet  0    ROS Negative other than HPI.   PE General Appearance: well developed, well nourished in no acute distress, obese HEENT: symmetrical face, PERRLA, good dentition  Neck: no JVD, thyromegaly, or adenopathy, trachea midline Chest: symmetric without deformity Cardiac: PMI non-displaced, RRR, normal S1, S2, no gallop, soft systolic murmur,Lung: clear to ausculation and percussion Vascular: all pulses full without bruits  Abdominal: nondistended, nontender, good bowel sounds, no HSM, no  bruits Extremities: no cyanosis, clubbing or edema, no sign of DVT, no varicosities  Skin: normal color, no rashes Neuro: alert and oriented x 3, non-focal Pysch: normal affect Filed Vitals:   08/14/11 1407  BP: 151/66  Pulse: 65  Resp: 12  Height: 5\' 9"  (1.753 m)  Weight: 258 lb (117.028 kg)    EKG  Labs and Studies Reviewed.   Lab Results  Component Value Date   WBC 8.5 03/21/2010   HGB 13.5 03/21/2010   HCT 40.1 03/21/2010   MCV 93.8 03/21/2010   PLT 227.0 03/21/2010      Chemistry      Component Value Date/Time   NA 138 11/12/2010 1610   K 4.7 11/12/2010 1610   CL 101 11/12/2010 1610   CO2 28 11/12/2010 1610   BUN 15 11/12/2010 1610   CREATININE 1.1 11/12/2010 1610      Component Value Date/Time   CALCIUM 9.8 11/12/2010 1610   ALKPHOS 50 07/07/2008 1048   AST 26 07/07/2008 1048   ALT 31 07/07/2008 1048   BILITOT 1.0 07/07/2008 1048       Lab Results  Component Value Date   CHOL 144 07/07/2008   CHOL  121 12/18/2006   Lab Results  Component Value Date   HDL 51.5 07/07/2008   HDL 41.2 12/18/2006   Lab Results  Component Value Date   LDLCALC 78 07/07/2008   LDLCALC 57 12/18/2006   Lab Results  Component Value Date   TRIG 72 07/07/2008   Lab Results  Component Value Date   CHOLHDL 2.8 CALC 07/07/2008   Lab Results  Component Value Date   HGBA1C 7.1* 07/07/2008   Lab Results  Component Value Date   ALT 31 07/07/2008   AST 26 07/07/2008   ALKPHOS 50 07/07/2008   BILITOT 1.0 07/07/2008   Lab Results  Component Value Date   TSH 1.21 11/12/2010

## 2011-08-14 NOTE — Assessment & Plan Note (Signed)
Stable. No change in medications.

## 2011-08-14 NOTE — Patient Instructions (Signed)
Your physician recommends that you schedule a follow-up appointment in: 6 months with Dr. Verl Blalock  Your physician has requested that you have a carotid duplex. This test is an ultrasound of the carotid arteries in your neck. It looks at blood flow through these arteries that supply the brain with blood. Allow one hour for this exam. There are no restrictions or special instructions.

## 2011-08-14 NOTE — Assessment & Plan Note (Signed)
Patient will watch salt and check blood pressure readings. He will review these with Dr. Sherren Mocha next week.

## 2011-08-20 ENCOUNTER — Telehealth: Payer: Self-pay | Admitting: Cardiology

## 2011-08-20 ENCOUNTER — Encounter (INDEPENDENT_AMBULATORY_CARE_PROVIDER_SITE_OTHER): Payer: Medicare Other | Admitting: *Deleted

## 2011-08-20 ENCOUNTER — Other Ambulatory Visit: Payer: Self-pay | Admitting: Cardiology

## 2011-08-20 ENCOUNTER — Telehealth: Payer: Self-pay | Admitting: *Deleted

## 2011-08-20 DIAGNOSIS — R0989 Other specified symptoms and signs involving the circulatory and respiratory systems: Secondary | ICD-10-CM

## 2011-08-20 DIAGNOSIS — I6529 Occlusion and stenosis of unspecified carotid artery: Secondary | ICD-10-CM

## 2011-08-20 NOTE — Telephone Encounter (Signed)
Harriet reviewed study of carotid ultrasound with Dr. Angelena Form since his blockages have gotten progressively worse. He recommends a f/u with [facility]. I will put this order in the computer to be scheduled.

## 2011-08-20 NOTE — Telephone Encounter (Signed)
Pt aware and referral made sent as urgent

## 2011-08-20 NOTE — Telephone Encounter (Signed)
I spoke with pt about [facility] referral b/c of todays carotid results as per Dr. Johnsie Cancel. He is aware our office or [facility] will call pt regarding appt possibly as early as tomorrow to review study with surgeon. Pt said he expected this as he was aware of preliminary results after test completion. Horton Chin RN

## 2011-08-21 ENCOUNTER — Encounter: Payer: Self-pay | Admitting: *Deleted

## 2011-08-21 ENCOUNTER — Encounter: Payer: Self-pay | Admitting: Family Medicine

## 2011-08-21 ENCOUNTER — Telehealth: Payer: Self-pay | Admitting: Family Medicine

## 2011-08-21 ENCOUNTER — Ambulatory Visit (INDEPENDENT_AMBULATORY_CARE_PROVIDER_SITE_OTHER): Payer: Medicare Other | Admitting: Family Medicine

## 2011-08-21 ENCOUNTER — Encounter: Payer: Self-pay | Admitting: Vascular Surgery

## 2011-08-21 ENCOUNTER — Ambulatory Visit (INDEPENDENT_AMBULATORY_CARE_PROVIDER_SITE_OTHER): Payer: Medicare Other | Admitting: Vascular Surgery

## 2011-08-21 VITALS — BP 171/74 | HR 66

## 2011-08-21 DIAGNOSIS — E785 Hyperlipidemia, unspecified: Secondary | ICD-10-CM

## 2011-08-21 DIAGNOSIS — Z0000 Encounter for general adult medical examination without abnormal findings: Secondary | ICD-10-CM

## 2011-08-21 DIAGNOSIS — E669 Obesity, unspecified: Secondary | ICD-10-CM

## 2011-08-21 DIAGNOSIS — I6529 Occlusion and stenosis of unspecified carotid artery: Secondary | ICD-10-CM

## 2011-08-21 DIAGNOSIS — I2510 Atherosclerotic heart disease of native coronary artery without angina pectoris: Secondary | ICD-10-CM

## 2011-08-21 DIAGNOSIS — E119 Type 2 diabetes mellitus without complications: Secondary | ICD-10-CM

## 2011-08-21 DIAGNOSIS — I10 Essential (primary) hypertension: Secondary | ICD-10-CM

## 2011-08-21 DIAGNOSIS — R0989 Other specified symptoms and signs involving the circulatory and respiratory systems: Secondary | ICD-10-CM

## 2011-08-21 LAB — LIPID PANEL
Cholesterol: 187 mg/dL (ref 0–200)
HDL: 44.4 mg/dL (ref >39.00)
LDL Cholesterol: 124 mg/dL — ABNORMAL HIGH (ref 0–99)
Total CHOL/HDL Ratio: 4
Triglycerides: 92 mg/dL (ref 0.0–149.0)
VLDL: 18.4 mg/dL (ref 0.0–40.0)

## 2011-08-21 LAB — CBC WITH DIFFERENTIAL/PLATELET
Basophils Absolute: 0 10*3/uL (ref 0.0–0.1)
Basophils Relative: 0.6 % (ref 0.0–3.0)
Eosinophils Absolute: 0.2 10*3/uL (ref 0.0–0.7)
Eosinophils Relative: 2.6 % (ref 0.0–5.0)
HCT: 42.6 % (ref 39.0–52.0)
Hemoglobin: 14.2 g/dL (ref 13.0–17.0)
Lymphocytes Relative: 33.6 % (ref 12.0–46.0)
Lymphs Abs: 2.3 10*3/uL (ref 0.7–4.0)
MCHC: 33.4 g/dL (ref 30.0–36.0)
MCV: 92.3 fl (ref 78.0–100.0)
Monocytes Absolute: 0.6 10*3/uL (ref 0.1–1.0)
Monocytes Relative: 9.3 % (ref 3.0–12.0)
Neutro Abs: 3.7 10*3/uL (ref 1.4–7.7)
Neutrophils Relative %: 53.9 % (ref 43.0–77.0)
Platelets: 229 10*3/uL (ref 150.0–400.0)
RBC: 4.61 Mil/uL (ref 4.22–5.81)
RDW: 15.1 % — ABNORMAL HIGH (ref 11.5–14.6)
WBC: 6.8 10*3/uL (ref 4.5–10.5)

## 2011-08-21 LAB — MICROALBUMIN / CREATININE URINE RATIO
Creatinine,U: 87.8 mg/dL
Microalb Creat Ratio: 11.6 mg/g (ref 0.0–30.0)
Microalb, Ur: 10.2 mg/dL — ABNORMAL HIGH (ref 0.0–1.9)

## 2011-08-21 LAB — POCT URINALYSIS DIPSTICK
Bilirubin, UA: NEGATIVE
Blood, UA: NEGATIVE
Ketones, UA: NEGATIVE
Leukocytes, UA: NEGATIVE
Nitrite, UA: NEGATIVE
Spec Grav, UA: 1.02
Urobilinogen, UA: 0.2
pH, UA: 6.5

## 2011-08-21 LAB — HEPATIC FUNCTION PANEL
ALT: 20 U/L (ref 0–53)
AST: 20 U/L (ref 0–37)
Albumin: 3.9 g/dL (ref 3.5–5.2)
Alkaline Phosphatase: 56 U/L (ref 39–117)
Bilirubin, Direct: 0.2 mg/dL (ref 0.0–0.3)
Total Bilirubin: 1 mg/dL (ref 0.3–1.2)
Total Protein: 6.7 g/dL (ref 6.0–8.3)

## 2011-08-21 LAB — PSA: PSA: 1.18 ng/mL (ref 0.10–4.00)

## 2011-08-21 LAB — HEMOGLOBIN A1C: Hgb A1c MFr Bld: 8.3 % — ABNORMAL HIGH (ref 4.6–6.5)

## 2011-08-21 LAB — TSH: TSH: 1.52 u[IU]/mL (ref 0.35–5.50)

## 2011-08-21 MED ORDER — METFORMIN HCL 1000 MG PO TABS: 1000.0000 mg | ORAL_TABLET | Freq: Two times a day (BID) | ORAL | Status: DC

## 2011-08-21 MED ORDER — LISINOPRIL 40 MG PO TABS: 40.0000 mg | ORAL_TABLET | Freq: Every day | ORAL | Status: DC

## 2011-08-21 MED ORDER — ATENOLOL 100 MG PO TABS: 100.0000 mg | ORAL_TABLET | Freq: Two times a day (BID) | ORAL | Status: DC

## 2011-08-21 MED ORDER — NITROGLYCERIN 0.4 MG SL SUBL: 0.4000 mg | SUBLINGUAL_TABLET | SUBLINGUAL | Status: DC | PRN

## 2011-08-21 MED ORDER — AMLODIPINE BESYLATE 10 MG PO TABS: 10.0000 mg | ORAL_TABLET | Freq: Every day | ORAL | Status: DC

## 2011-08-21 MED ORDER — SIMVASTATIN 20 MG PO TABS
20.0000 mg | ORAL_TABLET | Freq: Every day | ORAL | Status: DC
Start: 2011-08-21 — End: 2012-09-07

## 2011-08-21 MED ORDER — "INSULIN SYRINGE 29G X 1/2"" 1 ML MISC": 1.0000 | Status: DC

## 2011-08-21 MED ORDER — FUROSEMIDE 40 MG PO TABS: 40.0000 mg | ORAL_TABLET | Freq: Two times a day (BID) | ORAL | Status: DC

## 2011-08-21 MED ORDER — GLIPIZIDE 10 MG PO TABS: 10.0000 mg | ORAL_TABLET | Freq: Two times a day (BID) | ORAL | Status: DC

## 2011-08-21 NOTE — Progress Notes (Signed)
Subjective:     Patient ID: [name], male   DOB: 02-17-1941, 70 y.o.   MRN: [id number]  HPI  This is a 70 year old right-handed gentleman who was referred by Dr. Mar Daring with a left carotid stenosis. The patient has known carotid disease and on his most recent followup study the left carotid stenosis had progressed significantly. We were asked to see him as an add-on today.  The patient had a previous hemorrhagic stroke but he cannot remember the date or the details of the stroke. With exception of this one hemorrhagic stroke is had no other history of stroke. He's had no history of TIA, expressive or receptive aphasia, or amaurosis fugax.  Past Medical History  Diagnosis Date  . Blood transfusion   . Diabetes mellitus   . GERD (gastroesophageal reflux disease)   . Hyperlipidemia   . Hypertension   . CVA (cerebral infarction) 2004  . CAD (coronary artery disease)   . Peripheral vascular disease   . Carotid artery occlusion   . Post PTCA 08/21/11    2008    Family History  Problem Relation Age of Onset  . Heart disease Mother     History  Substance Use Topics  . Smoking status: Never Smoker   . Smokeless tobacco: Not on file  . Alcohol Use: No    Allergies  Allergen Reactions  . Adhesive (Tape)     rash  . Clopidogrel Bisulfate     REACTION: rash, itching    Current Outpatient Prescriptions  Medication Sig Dispense Refill  . amLODipine (NORVASC) 10 MG tablet Take 1 tablet (10 mg total) by mouth daily.  100 tablet  3  . aspirin 325 MG tablet Take 325 mg by mouth 2 (two) times daily before a meal.        . atenolol (TENORMIN) 100 MG tablet Take 1 tablet (100 mg total) by mouth 2 (two) times daily. 1 tab po bid  200 tablet  3  . furosemide (LASIX) 40 MG tablet Take 1 tablet (40 mg total) by mouth 2 (two) times daily.  200 tablet  3  . glipiZIDE (GLUCOTROL) 10 MG tablet Take 1 tablet (10 mg total) by mouth 2 (two) times daily before a meal.  200 tablet  3  .  INSULIN SYRINGE 1CC/29G (B-D INSULIN SYRINGE) 29G X 1/2" 1 ML MISC 1 each by Does not apply route 1 day or 1 dose.  100 each  3  . lisinopril (PRINIVIL,ZESTRIL) 40 MG tablet Take 1 tablet (40 mg total) by mouth daily.  100 tablet  3  . metFORMIN (GLUCOPHAGE) 1000 MG tablet Take 1 tablet (1,000 mg total) by mouth 2 (two) times daily with a meal.  200 tablet  3  . Multiple Vitamin (MULTIVITAMIN) tablet Take 1 tablet by mouth daily.        . nitroGLYCERIN (NITROSTAT) 0.4 MG SL tablet Place 1 tablet (0.4 mg total) under the tongue every 5 (five) minutes as needed for chest pain.  25 tablet  3  . omeprazole (PRILOSEC) 20 MG capsule Take 20 mg by mouth as needed.       . peg 3350 powder (MOVIPREP) 100 G SOLR moviprep as directed  1 kit  0  . Potassium Chloride (KLOR-CON PO) Takes 2 tablets twice a day      . simvastatin (ZOCOR) 20 MG tablet Take 1 tablet (20 mg total) by mouth at bedtime.  90 tablet  3  . DISCONTD: amLODipine (NORVASC) 10  MG tablet Take 10 mg by mouth daily.       [name] DISCONTD: atenolol (TENORMIN) 100 MG tablet 1 tab po bid       . DISCONTD: furosemide (LASIX) 40 MG tablet Take 40 mg by mouth 2 (two) times daily.        [name] DISCONTD: glipiZIDE (GLUCOTROL) 10 MG tablet Take 10 mg by mouth 2 (two) times daily before a meal.       . DISCONTD: lisinopril (PRINIVIL,ZESTRIL) 40 MG tablet Take 40 mg by mouth daily.       [name] DISCONTD: metFORMIN (GLUCOPHAGE) 1000 MG tablet TAKE 1 TABLET TWICE DAILY -- NEED OFFICE VISIT  60 tablet  0  . DISCONTD: simvastatin (ZOCOR) 20 MG tablet TAKE 1 TAB BY MOUTH AT BEDTIME  90 tablet  0    Review of Systems  Constitutional: Negative for fever, chills and appetite change.  Respiratory: Positive for shortness of breath. Negative for cough, chest tightness and wheezing.   Cardiovascular: Negative for chest pain, palpitations and leg swelling.  Gastrointestinal: Negative for nausea, vomiting, diarrhea, constipation and blood in stool.  Genitourinary: Negative for  dysuria, frequency and hematuria.  Musculoskeletal: Negative for myalgias and arthralgias.  Skin: Negative for rash and wound.  Neurological: Negative for dizziness, seizures, speech difficulty, weakness, numbness and headaches.  Hematological: Does not bruise/bleed easily.  Psychiatric/Behavioral: Negative for confusion.       Objective:   Physical Exam  Constitutional: He is oriented to person, place, and time. He appears well-developed and well-nourished.  HENT:  Head: Normocephalic and atraumatic.  Neck: Neck supple. No JVD present. No thyromegaly present.  Cardiovascular: Normal rate, regular rhythm and normal heart sounds.  Exam reveals no friction rub.   No murmur heard. Pulses:      Carotid pulses are on the right side with bruit, and on the left side with bruit.      Radial pulses are 2+ on the right side, and 2+ on the left side.       Femoral pulses are 2+ on the right side, and 2+ on the left side.      Bilat LE edema.  Pulmonary/Chest: Breath sounds normal. He has no wheezes. He has no rales.  Abdominal: Soft. Bowel sounds are normal. There is no tenderness.  Musculoskeletal: He exhibits no edema.  Lymphadenopathy:    He has no cervical adenopathy.  Neurological: He is alert and oriented to person, place, and time.  Skin: No rash noted.  Psychiatric: He has a normal mood and affect.   Filed Vitals:   08/21/11 1646  BP: 171/74  Pulse: 66    There is no height or weight on file to calculate BMI.  Have reviewed his records from Dr. Teressa Lower office. He follows him with his coronary artery disease and carotid disease. I reviewed his labs from Dr. Winnifred Friar office which showed a creatinine of 1.1. Hemoglobin is 13.5.  Reviewed his carotid duplex scan which shows that he has a very critical left carotid stenosis. End diastolic velocity on the left is 188 cm/s. He has a 60-79% right carotid stenosis.     Assessment:     This patient has a greater than 80% left  carotid stenosis. Is asymptomatic. I have recommended left carotid endarterectomy.I have reviewed the indicatioThe surgery is scheduled for 08/23/2011. He noticed a continue his aspirin right up through surgery.ns for carotid endarterectomy, that is to lower the risk of future stroke. I have also reviewed the potential  complications of surgery, including but not limited to: bleeding, stroke (perioperative risk 1-2%), MI, nerve injury of other unpredictable medical problems. All of the patients questions were answered and they are agreeable to proceed with surgery.     Plan:     The surgery is scheduled for a 2412. He is to continue his aspirin right up through surgery.

## 2011-08-21 NOTE — Patient Instructions (Signed)
Continue previous medications.  Walk 15 minutes daily.  Tightened up on your diet.  Begin insulin 10 units at bedtime starting Thursday night.  Check a fasting blood sugar daily in the morning.  Return in one week for follow-up with a record of all your blood sugar readings.  Purchase a new digital pump up blood pressure cuff and check  y  blood pressure daily in the morning

## 2011-08-21 NOTE — Telephone Encounter (Signed)
Refill nitroglycerin

## 2011-08-21 NOTE — Progress Notes (Signed)
Subjective:    Patient ID: [name], male    DOB: Jul 23, 1941, 70 y.o.   MRN: [id number]  HPI  [name] is a 70 year old male Married nonsmoker, who comes in today for Medicare wellness examination because of a history of hypertension, diabetes, hyperlipidemia, obesity, coronary disease, reflux, esophagitis, and now new problem of 80% blockage of his left carotid artery.  For hypertension.  He takes Norvasc 10 mg daily, Tenormin, 100 mg b.i.d., Lasix, 40 mg b.i.d., lisinopril 40 mg daily, and a potassium supplement two tabs daily.  BP 120/70.  For his diabetes.  He takes Glucotrol 10 mg b.i.d. And metformin 1000 mg b.i.d.  In the past.  This is controlled.  His blood sugar however, now his blood sugar is gone up in the 200 range.  Will add insulin.  He has a history of underlying coronary artery disease.  He recently had a checkup with Dr. Verl Blalock.  His last carotid Doppler was two years ago, therefore, was repeated.  Right carotid he tells me is okay.  Left is 80% blocked.  He is awaiting a vascular consult.  He takes Prilosec OTC 20 mg for reflux.  Weight is unchanged at 261.  He gets routine eye care, dental care, hearing normal, activities of daily living.  Normal.  He walks on a daily basis, cognitive function, normal, and safety reviewed.  No issues identified, no guns in the house, he does have a healthcare power of attorney and living will.  Tetanus 2009 Pneumovax x 2.  Shingles.  Vaccine pending    Review of Systems  Constitutional: Negative.   HENT: Negative.   Eyes: Negative.   Respiratory: Negative.   Cardiovascular: Negative.   Gastrointestinal: Negative.   Genitourinary: Negative.   Musculoskeletal: Negative.   Skin: Negative.   Neurological: Negative.   Hematological: Negative.   Psychiatric/Behavioral: Negative.        Objective:   Physical Exam  Constitutional: He is oriented to person, place, and time. He appears well-developed and well-nourished.    HENT:  Head: Normocephalic and atraumatic.  Right Ear: External ear normal.  Left Ear: External ear normal.  Nose: Nose normal.  Mouth/Throat: Oropharynx is clear and moist.  Eyes: Conjunctivae and EOM are normal. Pupils are equal, round, and reactive to light.  Neck: Normal range of motion. Neck supple. No JVD present. No tracheal deviation present. No thyromegaly present.  Cardiovascular: Normal rate, regular rhythm, normal heart sounds and intact distal pulses.  Exam reveals no gallop and no friction rub.   No murmur heard. Pulmonary/Chest: Effort normal and breath sounds normal. No stridor. No respiratory distress. He has no wheezes. He has no rales. He exhibits no tenderness.  Abdominal: Soft. Bowel sounds are normal. He exhibits no distension and no mass. There is no tenderness. There is no rebound and no guarding.  Genitourinary: Rectum normal, prostate normal and penis normal. Guaiac negative stool. No penile tenderness.  Musculoskeletal: Normal range of motion. He exhibits no edema and no tenderness.  Lymphadenopathy:    He has no cervical adenopathy.  Neurological: He is alert and oriented to person, place, and time. He has normal reflexes. No cranial nerve deficit. He exhibits normal muscle tone.  Skin: Skin is warm and dry. No rash noted. No erythema. No pallor.  Psychiatric: He has a normal mood and affect. His behavior is normal. Judgment and thought content normal.   Right carotid quiet, left carotid high pitched bruit       Assessment &  Plan:  Hypertension continue current medications BP normal 120/70.  Diabetes max that of oral medicine.  Start insulin 10 units,,,,,,,,,, first dose given by me today of the Humalog 7525.  Hyperlipidemia.  Check lipid level.  Reflux esophagitis.  Continue Prilosec 20 daily.  Continue aspirin.  Begin your walking program daily.  Check a fasting blood sugar daily in the morning or at any time you feel your blood sugar might be  low.  Return one week for follow-up.  Coronary disease, followed by Dr. Verl Blalock.  Blocked left carotid 80+ percent mask or even out ASAP

## 2011-08-23 ENCOUNTER — Other Ambulatory Visit: Payer: Self-pay | Admitting: Vascular Surgery

## 2011-08-23 ENCOUNTER — Inpatient Hospital Stay (HOSPITAL_COMMUNITY): Payer: Medicare Other

## 2011-08-23 ENCOUNTER — Other Ambulatory Visit: Payer: Self-pay | Admitting: Family Medicine

## 2011-08-23 ENCOUNTER — Inpatient Hospital Stay (HOSPITAL_COMMUNITY)
Admission: RE | Admit: 2011-08-23 | Discharge: 2011-08-24 | DRG: 039 | Disposition: A | Discharge status: Home / Self Care | Payer: Medicare Other | Source: Ambulatory Visit | Attending: Vascular Surgery | Admitting: Vascular Surgery

## 2011-08-23 DIAGNOSIS — I6529 Occlusion and stenosis of unspecified carotid artery: Principal | ICD-10-CM | POA: Diagnosis present

## 2011-08-23 DIAGNOSIS — R609 Edema, unspecified: Secondary | ICD-10-CM | POA: Diagnosis present

## 2011-08-23 DIAGNOSIS — E119 Type 2 diabetes mellitus without complications: Secondary | ICD-10-CM | POA: Diagnosis present

## 2011-08-23 DIAGNOSIS — E7800 Pure hypercholesterolemia, unspecified: Secondary | ICD-10-CM | POA: Diagnosis present

## 2011-08-23 DIAGNOSIS — Z7982 Long term (current) use of aspirin: Secondary | ICD-10-CM

## 2011-08-23 DIAGNOSIS — J45909 Unspecified asthma, uncomplicated: Secondary | ICD-10-CM | POA: Diagnosis present

## 2011-08-23 DIAGNOSIS — Z9861 Coronary angioplasty status: Secondary | ICD-10-CM

## 2011-08-23 DIAGNOSIS — I2510 Atherosclerotic heart disease of native coronary artery without angina pectoris: Secondary | ICD-10-CM | POA: Diagnosis present

## 2011-08-23 DIAGNOSIS — I10 Essential (primary) hypertension: Secondary | ICD-10-CM | POA: Diagnosis present

## 2011-08-23 DIAGNOSIS — K219 Gastro-esophageal reflux disease without esophagitis: Secondary | ICD-10-CM | POA: Diagnosis present

## 2011-08-23 DIAGNOSIS — Z79899 Other long term (current) drug therapy: Secondary | ICD-10-CM

## 2011-08-23 HISTORY — PX: CAROTID ENDARTERECTOMY: SUR193

## 2011-08-23 LAB — URINALYSIS, ROUTINE W REFLEX MICROSCOPIC
Bilirubin Urine: NEGATIVE
Glucose, UA: NEGATIVE mg/dL
Hgb urine dipstick: NEGATIVE
Ketones, ur: NEGATIVE mg/dL
Leukocytes, UA: NEGATIVE
Nitrite: NEGATIVE
Protein, ur: NEGATIVE mg/dL
Specific Gravity, Urine: 1.02 (ref 1.005–1.030)
Urobilinogen, UA: 0.2 mg/dL (ref 0.0–1.0)
pH: 5.5 (ref 5.0–8.0)

## 2011-08-23 LAB — COMPREHENSIVE METABOLIC PANEL
ALT: 19 U/L (ref 0–53)
AST: 19 U/L (ref 0–37)
Albumin: 3.8 g/dL (ref 3.5–5.2)
Alkaline Phosphatase: 64 U/L (ref 39–117)
BUN: 23 mg/dL (ref 6–23)
CO2: 23 mEq/L (ref 19–32)
Calcium: 9.8 mg/dL (ref 8.4–10.5)
Chloride: 104 mEq/L (ref 96–112)
Creatinine, Ser: 0.89 mg/dL (ref 0.50–1.35)
GFR calc Af Amer: >60 mL/min (ref >60)
GFR calc non Af Amer: >60 mL/min (ref >60)
Glucose, Bld: 157 mg/dL — ABNORMAL HIGH (ref 70–99)
Potassium: 4 mEq/L (ref 3.5–5.1)
Sodium: 140 mEq/L (ref 135–145)
Total Bilirubin: 0.5 mg/dL (ref 0.3–1.2)
Total Protein: 7.1 g/dL (ref 6.0–8.3)

## 2011-08-23 LAB — GLUCOSE, CAPILLARY
Glucose-Capillary: 135 mg/dL — ABNORMAL HIGH (ref 70–99)
Glucose-Capillary: 164 mg/dL — ABNORMAL HIGH (ref 70–99)
Glucose-Capillary: 143 mg/dL — ABNORMAL HIGH (ref 70–99)
Glucose-Capillary: 191 mg/dL — ABNORMAL HIGH (ref 70–99)

## 2011-08-23 LAB — TYPE AND SCREEN
ABO/RH(D): A POS
Antibody Screen: NEGATIVE

## 2011-08-23 LAB — CBC
HCT: 42.5 % (ref 39.0–52.0)
Hemoglobin: 15.2 g/dL (ref 13.0–17.0)
MCH: 31.1 pg (ref 26.0–34.0)
MCHC: 35.8 g/dL (ref 30.0–36.0)
MCV: 87.1 fL (ref 78.0–100.0)
Platelets: 234 10*3/uL (ref 150–400)
RBC: 4.88 MIL/uL (ref 4.22–5.81)
RDW: 14.3 % (ref 11.5–15.5)
WBC: 9.9 10*3/uL (ref 4.0–10.5)

## 2011-08-23 LAB — SURGICAL PCR SCREEN
MRSA, PCR: NEGATIVE
Staphylococcus aureus: POSITIVE — AB

## 2011-08-23 LAB — PROTIME-INR
INR: 0.88 (ref 0.00–1.49)
Prothrombin Time: 12.1 seconds (ref 11.6–15.2)

## 2011-08-23 LAB — APTT: aPTT: 33 seconds (ref 24–37)

## 2011-08-23 LAB — ABO/RH: ABO/RH(D): A POS

## 2011-08-24 LAB — BASIC METABOLIC PANEL
BUN: 16 mg/dL (ref 6–23)
CO2: 27 mEq/L (ref 19–32)
Calcium: 8.5 mg/dL (ref 8.4–10.5)
Chloride: 108 mEq/L (ref 96–112)
Creatinine, Ser: 0.63 mg/dL (ref 0.50–1.35)
GFR calc Af Amer: >60 mL/min (ref >60)
GFR calc non Af Amer: >60 mL/min (ref >60)
Glucose, Bld: 153 mg/dL — ABNORMAL HIGH (ref 70–99)
Potassium: 4 mEq/L (ref 3.5–5.1)
Sodium: 140 mEq/L (ref 135–145)

## 2011-08-24 LAB — CBC
HCT: 35.9 % — ABNORMAL LOW (ref 39.0–52.0)
Hemoglobin: 12.4 g/dL — ABNORMAL LOW (ref 13.0–17.0)
MCH: 30.5 pg (ref 26.0–34.0)
MCHC: 34.5 g/dL (ref 30.0–36.0)
MCV: 88.2 fL (ref 78.0–100.0)
Platelets: 186 10*3/uL (ref 150–400)
RBC: 4.07 MIL/uL — ABNORMAL LOW (ref 4.22–5.81)
RDW: 14.6 % (ref 11.5–15.5)
WBC: 12 10*3/uL — ABNORMAL HIGH (ref 4.0–10.5)

## 2011-08-24 LAB — GLUCOSE, CAPILLARY: Glucose-Capillary: 163 mg/dL — ABNORMAL HIGH (ref 70–99)

## 2011-08-24 LAB — HEMOGLOBIN A1C
Hgb A1c MFr Bld: 7.8 % — ABNORMAL HIGH (ref <5.7)
Mean Plasma Glucose: 177 mg/dL — ABNORMAL HIGH (ref <117)

## 2011-08-26 LAB — GLUCOSE, CAPILLARY: Glucose-Capillary: 137 mg/dL — ABNORMAL HIGH (ref 70–99)

## 2011-08-26 NOTE — Discharge Summary (Addendum)
  [name], [name]        ACCOUNT NO.:  [id number]  MEDICAL RECORD NO.:  [id number]  LOCATION:  74                         FACILITY:  [facility]  PHYSICIAN:  Judeth Cornfield. [name], M.D.DATE OF BIRTH:  March 24, 1941  DATE OF ADMISSION:  08/23/2011 DATE OF DISCHARGE:  08/24/2011                              DISCHARGE SUMMARY   CHIEF COMPLAINT:  Asymptomatic greater than 80% left carotid stenosis.  HISTORY OF PRESENT ILLNESS:  Mr. [name] is a 70 year old gentleman who has been followed at the [facility] with left carotid stenosis, which has progressed to greater than 80%.  It was recommended that he had undergo a left carotid endarterectomy in our to lower his risk of future stroke.  The patient denied any symptoms of TIA, amaurosis, slurred speech, or other strokes like symptoms in the past and he was admitted for an elective left carotid endarterectomy.  PAST MEDICAL HISTORY:  Significant for: 1. Diabetes type 2. 2. Hypercholesterolemia. 3. Cardiac arrhythmia. 4. Coronary artery disease. 5. Hypertension.  HOSPITAL COURSE:  The patient was taken to the operating room on August 23, 2011, for a left carotid endarterectomy with Dacron patch angioplasty.  Postoperatively, the patient did well.  He was neurologically stable.  He denied any headache or difficulty swallowing. He had no tongue deviation or facial droop.  He had good equal strength in bilateral upper and lower extremities.  His left neck wound was soft and healing well.  He was ambulating, voiding, and taking p.o. and he will be discharged to home.  DISCHARGE MEDICATIONS: 1. Amlodipine 10 mg daily. 2. Aspirin 325 mg twice daily. 3. Atenolol 100 mg twice daily. 4. Lasix 40 mg twice daily. 5. Glipizide 10 mg twice daily with meals. 6. Lisinopril 40 mg daily. 7. Metformin 1000 mg twice daily. 8. Multivitamins daily. 9. Nitroglycerin as needed for chest pain sublingual. 10.Omeprazole 20 mg  daily. 11.Klor-Con 20 mEq b.i.d. 12.Simvastatin 20 mg at bedtime and he was given a prescription for 20     tablets of Percocet one to two tablets every 4 hours as needed for     pain.  Final diagnosis is asymptomatic greater than 80% left carotid stenosis, status post left carotid endarterectomy.  All of his other medical issues are diabetes, hypertension and were well controlled while in- hospital.  DISPOSITION:  The patient will be discharged to home.  He will follow up in 2 weeks with Dr. Scot [name].     [name], PA-C   ______________________________ Judeth Cornfield. [name], M.D.    RR/MEDQ  D:  08/24/2011  T:  08/24/2011  Job:  [id number]  Electronically Signed by [name] PA on 08/26/2011 10:23:07 AM Electronically Signed by Deitra Mayo M.D. on 08/26/2011 10:27:37 AM

## 2011-08-26 NOTE — Op Note (Signed)
[name], [name]        ACCOUNT NO.:  [id number]  MEDICAL RECORD NO.:  [id number]  LOCATION:  64                         FACILITY:  [facility]  PHYSICIAN:  [name]. [name], M.D.DATE OF BIRTH:  Feb 22, 1941  DATE OF PROCEDURE:  08/23/2011 DATE OF DISCHARGE:                              OPERATIVE REPORT   PREOPERATIVE DIAGNOSIS:  Asymptomatic greater than 80% left carotid stenosis.  POSTOPERATIVE DIAGNOSIS:  Asymptomatic greater than 80% left carotid stenosis.  PROCEDURE:  Left carotid endarterectomy with Dacron patch angioplasty.  SURGEON:  [name]. Jacob Dock, MD.  ASSISTANT:  Jacob Kearns, PA-C.  ANESTHESIA:  General.  INDICATIONS:  This is a 70 year old gentleman, who had been followed at [facility] office with a left carotid stenosis progressed to greater than 80% left carotid endarterectomy, was recommended in order to lower his risk of future stroke.  The procedure and potential complications were discussed with the patient preoperatively.  All his questions were answered and he was agreeable to proceed.  TECHNIQUE:  The patient was taken to the operating room, received a general anesthetic.  Arterial line had been placed by anesthesia.  The left neck was prepped and draped in usual sterile fashion.  An incision was made along the anterior border of the sternocleidomastoid and the dissection carried down to the common carotid artery which was dissected free and controlled with a Rummel tourniquet.  Facial vein was divided between 2-0 silk ties.  The internal carotid artery was controlled above the plaque and the external carotid artery was controlled.  The patient was heparinized.  After the heparin had circulated, the clamps were then placed on the internal, the external, and the common carotid artery, and a longitudinal arteriotomy made in the common carotid artery.  This was extended to the plaque into the internal carotid artery above the plaque.   A 10 Bard shunt was placed into the internal carotid artery, back bled, and then placed into the common carotid artery and secured with Rummel tourniquet.  Flow was reestablished to the shunt.  An endarterectomy plane was established proximally and the plaque was sharply divided.  Eversion endarterectomy was performed of the external carotid artery.  Distally, there is a nice taper in the plaque and no tacking sutures were required.  The artery was irrigated with copious amounts of heparin and dextran and all loose debris were removed.  The Dacron patch was then sewn using continuous 6-0 Prolene suture.  Prior to completing the patch closure, the shunt was removed.  The artery back bled and flushed appropriately and the anastomosis was completed.  Flow was reestablished first to the external carotid artery and then to the internal carotid artery.  At the completion, there was a good pulse distal to the patch and good Doppler flow.  Hemostasis was obtained in the wound and heparin was partially reversed with protamine.  The wound was closed in three layers.  The platysma was closed with a running 3-0 Vicryl.  The subcutaneous tissue was closed with a running 3-0 Vicryl.  The skin was closed with a 4-0 subcuticular stitch.  Sterile dressing was applied.  The patient tolerated the procedure well and was transferred to the recovery room in stable condition.  He awoke  neurologically intact.  All needle and sponge counts were correct.     [name]. [name], M.D.     CSD/MEDQ  D:  08/23/2011  T:  08/23/2011  Job:  [id number]  cc:   Thomas C. Verl Blalock, MD, [facility]  Electronically Signed by Deitra Mayo M.D. on 08/26/2011 10:27:29 AM

## 2011-08-27 ENCOUNTER — Other Ambulatory Visit: Payer: Self-pay | Admitting: Internal Medicine

## 2011-08-29 ENCOUNTER — Ambulatory Visit (INDEPENDENT_AMBULATORY_CARE_PROVIDER_SITE_OTHER): Payer: Medicare Other | Admitting: Family Medicine

## 2011-08-29 ENCOUNTER — Encounter: Payer: Self-pay | Admitting: Family Medicine

## 2011-08-29 DIAGNOSIS — E119 Type 2 diabetes mellitus without complications: Secondary | ICD-10-CM

## 2011-08-29 DIAGNOSIS — I10 Essential (primary) hypertension: Secondary | ICD-10-CM

## 2011-08-29 MED ORDER — LISINOPRIL 40 MG PO TABS: ORAL_TABLET | ORAL | Status: DC

## 2011-08-29 NOTE — Progress Notes (Signed)
  Subjective:    Patient ID: [name], male    DOB: 1941-06-25, 70 y.o.   MRN: [id number]  HPI.  [name] is a 70 year old, married male, nonsmoker, who comes in today for reevaluation of diabetes, and hypertension.  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  We start him on 10 units of 7525 nightly in addition to his oral medicines.  However, he did not start his insulin until this past Monday.  His blood pressure cuff that he brings in his malfunctioning.  BP [id number] to [id number] systolic, diastolic in the high 123XX123.  Dr. Shanon Rosser, did a left carotid endarterectomy August 25.  Recovering nicely.  No sequelae    Review of Systems    General metabolic review of systems otherwise negative.  No hypoglycemia Objective:   Physical Exam BP right arm sitting position, 150/80, pulse 70 and regular       Assessment & Plan:  Diabetes type 1, not at goal.  Plan take insulin nightly fasting blood sugar daily walk daily.  Follow diet.  Follow-up in 3 weeks.  Hypertension not at goal increase lisinopril from 40 mg a day to 60.  BP check daily in the morning.  Follow-up in 3 weeks

## 2011-08-29 NOTE — Patient Instructions (Signed)
Take the 10 units of insulin nightly at bedtime.  Increase the lisinopril to 60 mg daily.  Check a fasting blood sugar and blood pressure daily in the morning.  Return in 3 weeks for follow-up.  Remember to walk daily

## 2011-09-03 ENCOUNTER — Encounter: Payer: Self-pay | Admitting: Vascular Surgery

## 2011-09-04 ENCOUNTER — Encounter: Payer: Self-pay | Admitting: Vascular Surgery

## 2011-09-04 ENCOUNTER — Ambulatory Visit (INDEPENDENT_AMBULATORY_CARE_PROVIDER_SITE_OTHER): Payer: Medicare Other | Admitting: Vascular Surgery

## 2011-09-04 DIAGNOSIS — Z9889 Other specified postprocedural states: Secondary | ICD-10-CM

## 2011-09-04 DIAGNOSIS — Z8679 Personal history of other diseases of the circulatory system: Secondary | ICD-10-CM

## 2011-09-04 DIAGNOSIS — R0989 Other specified symptoms and signs involving the circulatory and respiratory systems: Secondary | ICD-10-CM

## 2011-09-04 NOTE — Progress Notes (Addendum)
CC:  HPI: [name] is a 70 y.o. 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 He did well postoperatively and was discharged on postoperative day #1. Since discharge she's had no focal weakness or paresthesias. His had no problems swallowing or shortness of breath.  SOCIAL HISTORY: History  Substance Use Topics  . Smoking status: Never Smoker   . Smokeless tobacco: Not on file  . Alcohol Use: No    REVIEW OF SYSTEMS: CONSTITUTIONAL: No fever or chills CARDIOVASCULAR: No chest pain, chest pressure, palpitations, orthopnea, or dyspnea on exertion. No claudication or rest pain. No history of DVT or phlebitis.  PHYSICAL EXAM: Filed Vitals:   09/04/11 1031  BP: 157/73  Pulse: 63   Body mass index is 36.92 kg/(m^2).  GENERAL: The patient appears their stated age. The vital signs are documented above. CARDIOVASCULAR: There is a regular rate and rhythm without significant murmur appreciated.  PULMONARY: There is good air exchange bilaterally without wheezing or rales. NEUROLOGIC: No focal weakness or paresthesias are detected. SKIN: There are no ulcers or rashes noted. His neck incision on the left is healing nicely.  Patient is doing well after left carotid endarterectomy. I have ordered a followup carotid duplex scan for 6 months and I will see him back at that time. 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 He knows to call sooner if he has problems. In the meantime he is to continue taking his aspirin.

## 2011-09-19 ENCOUNTER — Encounter: Payer: Self-pay | Admitting: Family Medicine

## 2011-09-19 ENCOUNTER — Ambulatory Visit (INDEPENDENT_AMBULATORY_CARE_PROVIDER_SITE_OTHER): Payer: Medicare Other | Admitting: Family Medicine

## 2011-09-19 VITALS — BP 144/62 | Temp 98.0°F | Wt 254.0 lb

## 2011-09-19 DIAGNOSIS — Z23 Encounter for immunization: Secondary | ICD-10-CM

## 2011-09-19 DIAGNOSIS — E109 Type 1 diabetes mellitus without complications: Secondary | ICD-10-CM

## 2011-09-19 MED ORDER — INSULIN LISPRO PROT & LISPRO (75-25 MIX) 100 UNIT/ML ~~LOC~~ SUSP: 10.0000 [IU] | Freq: Every day | SUBCUTANEOUS | Status: DC

## 2011-09-19 NOTE — Progress Notes (Signed)
  Subjective:    Patient ID: [name], male    DOB: 06-Dec-1941, 70 y.o.   MRN: [id number]  HPI [name] is a 70-year-old male, married, nonsmoker, who comes back today for follow-up of diabetes, type I.  He graduated from diabetes, type II to type I.  We start him on 10 units of 75 -- 25 Humalog nightly.  He comes back today now.  His blood sugars are in the 80 to 110 range.  No hypoglycemia   Review of Systems    General and metabolic review of systems otherwise negative Objective:   Physical Exam Well-developed well-nourished, male in no acute distress       Assessment & Plan:  Diabetes type 1, under improved control with the addition of insulin 10 units nightly.  Plan continue current medications follow-up in two months

## 2011-09-19 NOTE — Patient Instructions (Signed)
Continue your current treatment program.  Check a fasting blood sugar first thing in the morning.  Return in two months for follow-up................. labs one week prior

## 2011-10-15 LAB — CBC
HCT: 37.1 — ABNORMAL LOW
HCT: 37.8 — ABNORMAL LOW
Hemoglobin: 12.5 — ABNORMAL LOW
Hemoglobin: 13
MCHC: 33.7
MCHC: 34.3
MCV: 87.4
MCV: 88.6
Platelets: 252
Platelets: 280
RBC: 4.18 — ABNORMAL LOW
RBC: 4.33
RDW: 15.4 — ABNORMAL HIGH
RDW: 15.5 — ABNORMAL HIGH
WBC: 12.6 — ABNORMAL HIGH
WBC: 7.7

## 2011-10-15 LAB — BASIC METABOLIC PANEL
BUN: 10
BUN: 15
CO2: 24
CO2: 26
Calcium: 8.8
Calcium: 8.9
Chloride: 101
Chloride: 108
Creatinine, Ser: 0.71
Creatinine, Ser: 0.73
GFR calc Af Amer: >60
GFR calc Af Amer: >60
GFR calc non Af Amer: >60
GFR calc non Af Amer: >60
Glucose, Bld: 184 — ABNORMAL HIGH
Glucose, Bld: 258 — ABNORMAL HIGH
Potassium: 4
Potassium: 4
Sodium: 134 — ABNORMAL LOW
Sodium: 138

## 2011-10-18 ENCOUNTER — Telehealth: Payer: Self-pay | Admitting: Family Medicine

## 2011-10-18 MED ORDER — INSULIN ASPART PROT & ASPART (70-30 MIX) 100 UNIT/ML ~~LOC~~ SUSP: 10.0000 [IU] | Freq: Every day | SUBCUTANEOUS | Status: DC

## 2011-10-18 NOTE — Telephone Encounter (Signed)
Ok to change

## 2011-10-18 NOTE — Telephone Encounter (Signed)
Pharmacist said that pts insurance will not cover insulin lispro protamine-insulin lispro (HUMALOG 75/25) (75-25) 100 UNIT/ML SUSP, but will cover Novolog 70/30. Pt is out of insulin. Pls call in to [facility] [address] [phone] today.

## 2011-10-18 NOTE — Telephone Encounter (Signed)
Is this okay to change?

## 2011-10-21 NOTE — Telephone Encounter (Signed)
Ok to change

## 2011-10-22 ENCOUNTER — Telehealth: Payer: Self-pay | Admitting: Internal Medicine

## 2011-10-22 NOTE — Telephone Encounter (Signed)
Pt states that he now takes 10units of long acting insulin. He wanted to check and make sure what he should do regarding his colon and the insulin. Per prep instructions pt should take 1/2 of their evening dose the night before the procedure. Pt aware. Pt ws concerned because he would not be eating anything the day of the procedure. Instructed pt to call his physician that manages his insulin if he still had questions and concerns. Pt verbalized understanding.

## 2011-10-24 ENCOUNTER — Encounter: Payer: Self-pay | Admitting: Internal Medicine

## 2011-10-24 ENCOUNTER — Ambulatory Visit (AMBULATORY_SURGERY_CENTER): Payer: Medicare Other | Admitting: Internal Medicine

## 2011-10-24 VITALS — BP 157/67 | HR 73 | Temp 97.6°F | Resp 20 | Ht 70.0 in | Wt 160.0 lb

## 2011-10-24 DIAGNOSIS — Z86010 Personal history of colonic polyps: Secondary | ICD-10-CM

## 2011-10-24 DIAGNOSIS — Z1211 Encounter for screening for malignant neoplasm of colon: Secondary | ICD-10-CM

## 2011-10-24 LAB — GLUCOSE, CAPILLARY
Glucose-Capillary: 163 mg/dL — ABNORMAL HIGH (ref 70–99)
Glucose-Capillary: 143 mg/dL — ABNORMAL HIGH (ref 70–99)

## 2011-10-24 MED ORDER — SODIUM CHLORIDE 0.9 % IV SOLN: 500.0000 mL | INTRAVENOUS | Status: DC

## 2011-10-24 NOTE — Patient Instructions (Signed)
FOLLOW YOUR DISCHARGE INSTRUCTIONS ON THE GREEN AND BLUE INSTRUCTION SHEETS.  CONTINUE YOUR MEDICATIONS.  FOLLOW UP COLONOSCOPY IN 5 YEARS.

## 2011-10-25 ENCOUNTER — Telehealth: Payer: Self-pay | Admitting: *Deleted

## 2011-10-25 ENCOUNTER — Telehealth: Payer: Self-pay

## 2011-10-25 NOTE — Telephone Encounter (Signed)

## 2011-10-25 NOTE — Telephone Encounter (Signed)
No ID on answering machine. 

## 2011-11-05 ENCOUNTER — Telehealth: Payer: Self-pay | Admitting: *Deleted

## 2011-11-05 NOTE — Telephone Encounter (Signed)
Patient is calling because his glucose readings are now 140-150 and he is wondering if he should increase his insuline.  Also if this is changed he would like to know if he should reschedule his lab appointment for tomorrow and his office visit in 2 weeks?

## 2011-11-06 ENCOUNTER — Other Ambulatory Visit (INDEPENDENT_AMBULATORY_CARE_PROVIDER_SITE_OTHER): Payer: Medicare Other

## 2011-11-06 DIAGNOSIS — E109 Type 1 diabetes mellitus without complications: Secondary | ICD-10-CM

## 2011-11-06 LAB — BASIC METABOLIC PANEL
BUN: 15 mg/dL (ref 6–23)
CO2: 30 mEq/L (ref 19–32)
Calcium: 9.2 mg/dL (ref 8.4–10.5)
Chloride: 103 mEq/L (ref 96–112)
Creatinine, Ser: 0.9 mg/dL (ref 0.4–1.5)
GFR: 92.12 mL/min (ref >60.00)
Glucose, Bld: 171 mg/dL — ABNORMAL HIGH (ref 70–99)
Potassium: 4.6 mEq/L (ref 3.5–5.1)
Sodium: 140 mEq/L (ref 135–145)

## 2011-11-06 LAB — HEMOGLOBIN A1C: Hgb A1c MFr Bld: 7.6 % — ABNORMAL HIGH (ref 4.6–6.5)

## 2011-11-11 ENCOUNTER — Encounter: Payer: Self-pay | Admitting: Family Medicine

## 2011-11-11 LAB — HM DIABETES EYE EXAM

## 2011-11-11 NOTE — Telephone Encounter (Signed)
Patient is aware 

## 2011-11-13 ENCOUNTER — Ambulatory Visit (INDEPENDENT_AMBULATORY_CARE_PROVIDER_SITE_OTHER): Payer: Medicare Other | Admitting: Family Medicine

## 2011-11-13 ENCOUNTER — Encounter: Payer: Self-pay | Admitting: Family Medicine

## 2011-11-13 VITALS — BP 130/64 | Temp 97.7°F | Wt 263.0 lb

## 2011-11-13 DIAGNOSIS — E109 Type 1 diabetes mellitus without complications: Secondary | ICD-10-CM

## 2011-11-13 MED ORDER — GLUCOSE BLOOD VI STRP: ORAL_STRIP | Status: DC

## 2011-11-13 NOTE — Patient Instructions (Signed)
Continue your current medications.  At age 70 minute walk daily.  Return in 3 months for follow-up labs one week prior

## 2011-11-13 NOTE — Progress Notes (Signed)
  Subjective:    Patient ID: [name], male    DOB: June 27, 1941, 70 y.o.   MRN: [id number]  HPIPhillip is a 70-year-old male, married, nonsmoker, type I diabetic, who comes in today for follow-up of diabetes.  He is currently on 10 units of long-acting insulin daily.  In addition to his oral medications.  Blood sugars dropped down to 105.  A1c has dropped to 7.6%.  No hypoglycemia.  He is not walking on a daily basis    Review of Systems    General metabolic review of systems otherwise negative Objective:   Physical Exam  Well-developed well-nourished, male in no acute distress      Assessment & Plan:  Diabetes type I.  Continue current medications add daily, walking follow-up A1c in 3 months

## 2011-11-29 ENCOUNTER — Other Ambulatory Visit: Payer: Self-pay | Admitting: Family Medicine

## 2012-01-03 ENCOUNTER — Encounter: Payer: Medicare Other | Admitting: Internal Medicine

## 2012-01-23 ENCOUNTER — Encounter: Payer: Self-pay | Admitting: *Deleted

## 2012-01-30 ENCOUNTER — Encounter: Payer: Self-pay | Admitting: Internal Medicine

## 2012-02-04 ENCOUNTER — Other Ambulatory Visit (INDEPENDENT_AMBULATORY_CARE_PROVIDER_SITE_OTHER): Payer: Medicare Other

## 2012-02-04 DIAGNOSIS — E109 Type 1 diabetes mellitus without complications: Secondary | ICD-10-CM

## 2012-02-04 LAB — BASIC METABOLIC PANEL
BUN: 20 mg/dL (ref 6–23)
CO2: 29 mEq/L (ref 19–32)
Calcium: 9.1 mg/dL (ref 8.4–10.5)
Chloride: 104 mEq/L (ref 96–112)
Creatinine, Ser: 0.8 mg/dL (ref 0.4–1.5)
GFR: 107.59 mL/min (ref >60.00)
Glucose, Bld: 128 mg/dL — ABNORMAL HIGH (ref 70–99)
Potassium: 4.1 mEq/L (ref 3.5–5.1)
Sodium: 140 mEq/L (ref 135–145)

## 2012-02-04 LAB — HEMOGLOBIN A1C: Hgb A1c MFr Bld: 7.7 % — ABNORMAL HIGH (ref 4.6–6.5)

## 2012-02-12 ENCOUNTER — Encounter: Payer: Self-pay | Admitting: Family Medicine

## 2012-02-12 ENCOUNTER — Ambulatory Visit (INDEPENDENT_AMBULATORY_CARE_PROVIDER_SITE_OTHER): Payer: Medicare Other | Admitting: Family Medicine

## 2012-02-12 VITALS — BP 130/76 | Temp 98.1°F | Wt 264.0 lb

## 2012-02-12 DIAGNOSIS — E109 Type 1 diabetes mellitus without complications: Secondary | ICD-10-CM

## 2012-02-12 MED ORDER — INSULIN LISPRO PROT & LISPRO (75-25 MIX) 100 UNIT/ML ~~LOC~~ SUSP: 15.0000 [IU] | Freq: Every day | SUBCUTANEOUS | Status: DC

## 2012-02-12 NOTE — Patient Instructions (Signed)
Increase the insulin to 15 units daily  Walk 20 minutes daily  Check a fasting blood sugar daily in the morning  Return in 3 months for followup sooner if any problems  Nonfasting labs one week prior

## 2012-02-12 NOTE — Progress Notes (Signed)
  Subjective:    Patient ID: [name], male    DOB: 10-Jun-1941, 71 y.o.   MRN: [id number]  HPI [name] is a 71 year old male who comes in today for followup of diabetes type 1  He's on Glucotrol 10 mg twice a day, metformin 1000 mg twice a day, insulin 10 units daily however his hemoglobin A1c is 7.7.  He is not following a diabetic diet nor does he walk on a daily basis.  We discussed various options. He will try a daily walking program and we will increase his insulin to try to get his blood sugar to goal   Review of Systems    general and metabolic review of systems otherwise negative Objective:   Physical Exam  Well-developed well-nourished male in no acute distress      Assessment & Plan:  Diabetes type 1 not at goal plan increase insulin more compliance with diet and walk daily followup in 3 months

## 2012-03-01 ENCOUNTER — Other Ambulatory Visit: Payer: Self-pay | Admitting: Family Medicine

## 2012-03-04 ENCOUNTER — Ambulatory Visit (INDEPENDENT_AMBULATORY_CARE_PROVIDER_SITE_OTHER): Payer: Medicare Other | Admitting: Cardiology

## 2012-03-04 ENCOUNTER — Encounter: Payer: Self-pay | Admitting: Cardiology

## 2012-03-04 VITALS — BP 164/74 | HR 72 | Ht 69.5 in | Wt 264.0 lb

## 2012-03-04 DIAGNOSIS — E669 Obesity, unspecified: Secondary | ICD-10-CM

## 2012-03-04 DIAGNOSIS — Z8679 Personal history of other diseases of the circulatory system: Secondary | ICD-10-CM | POA: Diagnosis not present

## 2012-03-04 DIAGNOSIS — Z9889 Other specified postprocedural states: Secondary | ICD-10-CM | POA: Insufficient documentation

## 2012-03-04 DIAGNOSIS — I2510 Atherosclerotic heart disease of native coronary artery without angina pectoris: Secondary | ICD-10-CM | POA: Diagnosis not present

## 2012-03-04 DIAGNOSIS — R0989 Other specified symptoms and signs involving the circulatory and respiratory systems: Secondary | ICD-10-CM | POA: Diagnosis not present

## 2012-03-04 DIAGNOSIS — I209 Angina pectoris, unspecified: Secondary | ICD-10-CM

## 2012-03-04 DIAGNOSIS — E785 Hyperlipidemia, unspecified: Secondary | ICD-10-CM | POA: Diagnosis not present

## 2012-03-04 DIAGNOSIS — I10 Essential (primary) hypertension: Secondary | ICD-10-CM

## 2012-03-04 NOTE — Assessment & Plan Note (Signed)
Stable. No change in treatment. Improve blood sugar control. Patient will consider enrolling in the ACCELERATE Trial to increase his HDL potentially.

## 2012-03-04 NOTE — Patient Instructions (Signed)
Your physician recommends that you continue on your current medications as directed. Please refer to the Current Medication list given to you today.   Your physician wants you to follow-up in: 1 year with Dr. Wall. You will receive a reminder letter in the mail two months in advance. If you don't receive a letter, please call our office to schedule the follow-up appointment.  

## 2012-03-04 NOTE — Assessment & Plan Note (Addendum)
Not ideally controlled. Encourage weight loss, increased activity, and salt restriction. Followup with primary care. Goal blood pressure less than 140 over less than 90.  It turns out he did not take his meds this morning.

## 2012-03-04 NOTE — Progress Notes (Signed)
HPI [name] Returns today for evaluation and management of his coronary disease as well as history of vascular disease.  He is tatus post carotid endarterectomy for an asymptomatic carotid bruit pickup in the office. He's done well since that time.  He has occasional chest tightness with exertion but has been fairly infrequent. We are treating this medically.  His weight is about stable. He is now started on insulin for his diabetes with Dr. Sherren Mocha.  Past Medical History  Diagnosis Date  . Blood transfusion   . Diabetes mellitus   . GERD (gastroesophageal reflux disease)   . Hyperlipidemia   . Hypertension   . CVA (cerebral infarction) 2004  . CAD (coronary artery disease)   . Peripheral vascular disease   . Carotid artery occlusion   . Post PTCA 08/21/11    2008    Current Outpatient Prescriptions  Medication Sig Dispense Refill  . amLODipine (NORVASC) 10 MG tablet Take 1 tablet (10 mg total) by mouth daily.  100 tablet  3  . aspirin 325 MG tablet Take 325 mg by mouth 2 (two) times daily before a meal.        . atenolol (TENORMIN) 100 MG tablet Take 1 tablet (100 mg total) by mouth 2 (two) times daily. 1 tab po bid  200 tablet  3  . furosemide (LASIX) 40 MG tablet Take 1 tablet (40 mg total) by mouth 2 (two) times daily.  200 tablet  3  . glipiZIDE (GLUCOTROL) 10 MG tablet Take 1 tablet (10 mg total) by mouth 2 (two) times daily before a meal.  200 tablet  3  . glucose blood (ONE TOUCH TEST STRIPS) test strip Use as instructed  100 each  12  . insulin lispro protamine-insulin lispro (HUMALOG MIX 75/25) (75-25) 100 UNIT/ML SUSP Inject 15 Units into the skin daily with supper.  10 mL  6  . INSULIN SYRINGE 1CC/29G (B-D INSULIN SYRINGE) 29G X 1/2" 1 ML MISC 1 each by Does not apply route 1 day or 1 dose.  100 each  3  . KLOR-CON M20 20 MEQ tablet TAKE 2 TABLETS EVERY MORNING AND 2 TABLETS EVERY EVENING  360 tablet  1  . lisinopril (PRINIVIL,ZESTRIL) 40 MG tablet 1 1/2 tablets  daily  150 tablet  3  . metFORMIN (GLUCOPHAGE) 1000 MG tablet Take 1 tablet (1,000 mg total) by mouth 2 (two) times daily with a meal.  200 tablet  3  . Multiple Vitamin (MULTIVITAMIN) tablet Take 1 tablet by mouth daily.        . nitroGLYCERIN (NITROSTAT) 0.4 MG SL tablet Place 1 tablet (0.4 mg total) under the tongue every 5 (five) minutes as needed for chest pain.  25 tablet  3  . NOVOLOG MIX 70/30 (70-30) 100 UNIT/ML injection As directed      . omeprazole (PRILOSEC) 20 MG capsule TAKE ONE CAPSULE BY MOUTH EVERY DAY  90 capsule  0  . Potassium Chloride (KLOR-CON PO) Takes 2 tablets twice a day      . simvastatin (ZOCOR) 20 MG tablet Take 1 tablet (20 mg total) by mouth at bedtime.  90 tablet  3    Allergies  Allergen Reactions  . Adhesive (Tape)     rash  . Clopidogrel Bisulfate     REACTION: rash, itching    Family History  Problem Relation Age of Onset  . Heart disease Mother     History   Social History  . Marital Status: Married  Spouse Name: N/A    Number of Children: N/A  . Years of Education: N/A   Occupational History  . Not on file.   Social History Main Topics  . Smoking status: Never Smoker   . Smokeless tobacco: Not on file  . Alcohol Use: No  . Drug Use: No  . Sexually Active: Not on file   Other Topics Concern  . Not on file   Social History Narrative  . No narrative on file    ROS ALL NEGATIVE EXCEPT THOSE NOTED IN HPI  PE  General Appearance: well developed, well nourished in no acute distress, obese HEENT: symmetrical face, PERRLA, good dentition  Neck: no JVD, thyromegaly, or adenopathy, trachea midline Chest: symmetric without deformity Cardiac: PMI poorly appreciated, RRR, Soft S1, S2, no gallop or murmur Lung: clear to ausculation and percussion Vascular: all pulses full without bruits  Abdominal: nondistended, nontender, good bowel sounds, no HSM, no bruits Extremities: no cyanosis, clubbing , 2+ edema bilaterallyor edema, no  sign of DVT, no varicosities  Skin: normal color, no rashes Neuro: alert and oriented x 3, non-focal Pysch: normal affect  EKG  BMET    Component Value Date/Time   NA 140 02/04/2012 0900   K 4.1 02/04/2012 0900   CL 104 02/04/2012 0900   CO2 29 02/04/2012 0900   GLUCOSE 128* 02/04/2012 0900   GLUCOSE 188* 12/18/2006 1228   BUN 20 02/04/2012 0900   CREATININE 0.8 02/04/2012 0900   CALCIUM 9.1 02/04/2012 0900   GFRNONAA >60 08/24/2011 0435   GFRAA >60 08/24/2011 0435    Lipid Panel     Component Value Date/Time   CHOL 187 08/21/2011 1007   TRIG 92.0 08/21/2011 1007   HDL 44.40 08/21/2011 1007   CHOLHDL 4 08/21/2011 1007   VLDL 18.4 08/21/2011 1007   LDLCALC 124* 08/21/2011 1007    CBC    Component Value Date/Time   WBC 12.0* 08/24/2011 0435   RBC 4.07* 08/24/2011 0435   HGB 12.4* 08/24/2011 0435   HCT 35.9* 08/24/2011 0435   PLT 186 08/24/2011 0435   MCV 88.2 08/24/2011 0435   MCH 30.5 08/24/2011 0435   MCHC 34.5 08/24/2011 0435   RDW 14.6 08/24/2011 0435   LYMPHSABS 2.3 08/21/2011 1007   MONOABS 0.6 08/21/2011 1007   EOSABS 0.2 08/21/2011 1007   BASOSABS 0.0 08/21/2011 1007

## 2012-03-17 ENCOUNTER — Encounter: Payer: Self-pay | Admitting: Vascular Surgery

## 2012-03-18 ENCOUNTER — Ambulatory Visit (INDEPENDENT_AMBULATORY_CARE_PROVIDER_SITE_OTHER): Payer: Medicare Other | Admitting: Vascular Surgery

## 2012-03-18 ENCOUNTER — Encounter: Payer: Self-pay | Admitting: Vascular Surgery

## 2012-03-18 ENCOUNTER — Other Ambulatory Visit (INDEPENDENT_AMBULATORY_CARE_PROVIDER_SITE_OTHER): Payer: Medicare Other | Admitting: *Deleted

## 2012-03-18 VITALS — BP 156/60 | HR 61 | Resp 16 | Ht 69.0 in | Wt 260.0 lb

## 2012-03-18 DIAGNOSIS — I6529 Occlusion and stenosis of unspecified carotid artery: Secondary | ICD-10-CM

## 2012-03-18 DIAGNOSIS — Z9889 Other specified postprocedural states: Secondary | ICD-10-CM

## 2012-03-18 DIAGNOSIS — Z48812 Encounter for surgical aftercare following surgery on the circulatory system: Secondary | ICD-10-CM

## 2012-03-18 NOTE — Progress Notes (Signed)
[facility]  Patient name: [name] MRN: [id number] DOB: 07/25/1941 Sex: male  REASON FOR VISIT: follow up of carotid disease  HPI: [name] is a 71 y.o. male who underwent a left carotid endarterectomy on May 20 412. He returns for a 6 month follow up visit. Since I saw him last, he's had no history of stroke, TIAs, expressive or receptive aphasia, or amaurosis fugax. He is on aspirin.  Past Medical History  Diagnosis Date  . Blood transfusion   . Diabetes mellitus   . GERD (gastroesophageal reflux disease)   . Hyperlipidemia   . Hypertension   . CVA (cerebral infarction) 2004  . CAD (coronary artery disease)   . Carotid artery occlusion   . Post PTCA 08/21/11    2008  . Stroke 2001  . Peripheral vascular disease 2001    Family History  Problem Relation Age of Onset  . Heart disease Mother   . Heart disease Father     Heart Disease before age 63  . Cancer Brother     Lung  . Cancer Brother     SOCIAL HISTORY: History  Substance Use Topics  . Smoking status: Never Smoker   . Smokeless tobacco: Not on file  . Alcohol Use: No    Allergies  Allergen Reactions  . Adhesive (Tape)     rash  . Clopidogrel Bisulfate     REACTION: rash, itching    Current Outpatient Prescriptions  Medication Sig Dispense Refill  . amLODipine (NORVASC) 10 MG tablet Take 1 tablet (10 mg total) by mouth daily.  100 tablet  3  . aspirin 325 MG tablet Take 325 mg by mouth 2 (two) times daily before a meal.        . atenolol (TENORMIN) 100 MG tablet Take 1 tablet (100 mg total) by mouth 2 (two) times daily. 1 tab po bid  200 tablet  3  . furosemide (LASIX) 40 MG tablet Take 1 tablet (40 mg total) by mouth 2 (two) times daily.  200 tablet  3  . glipiZIDE (GLUCOTROL) 10 MG tablet Take 1 tablet (10 mg total) by mouth 2 (two) times daily before a meal.  200 tablet  3  . glucose blood (ONE TOUCH TEST STRIPS) test strip Use as instructed  100  each  12  . insulin lispro protamine-insulin lispro (HUMALOG MIX 75/25) (75-25) 100 UNIT/ML SUSP Inject 15 Units into the skin daily with supper.  10 mL  6  . INSULIN SYRINGE 1CC/29G (B-D INSULIN SYRINGE) 29G X 1/2" 1 ML MISC 1 each by Does not apply route 1 day or 1 dose.  100 each  3  . KLOR-CON M20 20 MEQ tablet TAKE 2 TABLETS EVERY MORNING AND 2 TABLETS EVERY EVENING  360 tablet  1  . lisinopril (PRINIVIL,ZESTRIL) 40 MG tablet 1 1/2 tablets daily  150 tablet  3  . metFORMIN (GLUCOPHAGE) 1000 MG tablet Take 1 tablet (1,000 mg total) by mouth 2 (two) times daily with a meal.  200 tablet  3  . Multiple Vitamin (MULTIVITAMIN) tablet Take 1 tablet by mouth daily.        . nitroGLYCERIN (NITROSTAT) 0.4 MG SL tablet Place 1 tablet (0.4 mg total) under the tongue every 5 (five) minutes as needed for chest pain.  25 tablet  3  . NOVOLOG MIX 70/30 (70-30) 100 UNIT/ML injection As directed      . omeprazole (PRILOSEC) 20 MG capsule TAKE ONE  CAPSULE BY MOUTH EVERY DAY  90 capsule  0  . Potassium Chloride (KLOR-CON PO) Takes 2 tablets twice a day      . simvastatin (ZOCOR) 20 MG tablet Take 1 tablet (20 mg total) by mouth at bedtime.  90 tablet  3    REVIEW OF SYSTEMS: Valu.Nieves ] denotes positive finding; [  ] denotes negative finding  CARDIOVASCULAR:  [ ]  chest pain   [ ]  chest pressure   [ ]  palpitations   [ ]  orthopnea   [ ]  dyspnea on exertion   [ ]  claudication   [ ]  rest pain   [ ]  DVT   [ ]  phlebitis PULMONARY:   [ ]  productive cough   [ ]  asthma   [ ]  wheezing NEUROLOGIC:   [ ]  weakness  [ ]  paresthesias  [ ]  aphasia  [ ]  amaurosis  [ ]  dizziness PSYCHIATRIC:  [ ]  history of major depression INTEGUMENTARY:  [ ]  rashes  [ ]  ulcers CONSTITUTIONAL:  [ ]  fever   [ ]  chills  PHYSICAL EXAM: Filed Vitals:   03/18/12 1148  BP: 156/60  Pulse: 61  Resp: 16  Height: 5\' 9"  (1.753 m)  Weight: 260 lb (117.935 kg)  SpO2: 97%   Body mass index is 38.40 kg/(m^2). GENERAL: The patient is a well-nourished  male, in no acute distress. The vital signs are documented above. CARDIOVASCULAR: There is a regular rate and rhythm without significant murmur appreciated. He has a right carotid bruit. PULMONARY: There is good air exchange bilaterally without wheezing or rales. ABDOMEN: Soft and non-tender with normal pitched bowel sounds.  MUSCULOSKELETAL: There are no major deformities or cyanosis. NEUROLOGIC: No focal weakness or paresthesias are detected. SKIN: There are no ulcers or rashes noted. PSYCHIATRIC: The patient has a normal affect.  DATA:  I have independently interpreted his carotid duplex scan which shows no evidence of recurrent carotid stenosis on the left. He has a less than 39% right internal carotid artery stenosis. Both vertebral arteries are patent with normally directed flow.  MEDICAL ISSUES: Overall pleased with his progress. I recommended a follow carotid duplex scan in 1 year. He knows to call sooner if he has problems. In the meantime he knows to continue taking his aspirin.  [facility]: [phone]

## 2012-03-25 NOTE — Procedures (Unsigned)
CAROTID DUPLEX EXAM  INDICATION:  Carotid endarterectomy.  HISTORY: Diabetes:  Yes. Cardiac:  CAD. Hypertension:  No. Smoking:  No. Previous Surgery:  Left carotid endarterectomy on 08/23/2011. CV History:  Currently asymptomatic. Amaurosis Fugax No, Paresthesias No, Hemiparesis No.                                      RIGHT             LEFT Brachial systolic pressure:         148               142 Brachial Doppler waveforms:         Normal            Normal Vertebral direction of flow:        Antegrade         Antegrade DUPLEX VELOCITIES (cm/sec) CCA peak systolic                   80                95 ECA peak systolic                   186               [id number] ICA peak systolic                   150               85 ICA end diastolic                   29                19 PLAQUE MORPHOLOGY:                  Heterogenous      Homogenous PLAQUE AMOUNT:                      Mild              Mild PLAQUE LOCATION:                    ICA/ECA           ECA  IMPRESSION: 1. Doppler velocities suggest high-end 1% to 39% stenosis of the right     proximal internal carotid artery. 2. Patent left carotid endarterectomy site with no left anterior     carotid artery stenosis. 3. Bilateral external carotid artery stenoses noted.  ___________________________________________ Judeth Cornfield. Scot Dock, M.D.  CH/MEDQ  D:  03/19/2012  T:  03/19/2012  Job:  [id number]

## 2012-05-04 ENCOUNTER — Other Ambulatory Visit (INDEPENDENT_AMBULATORY_CARE_PROVIDER_SITE_OTHER): Payer: Medicare Other

## 2012-05-04 DIAGNOSIS — E109 Type 1 diabetes mellitus without complications: Secondary | ICD-10-CM

## 2012-05-04 DIAGNOSIS — IMO0001 Reserved for inherently not codable concepts without codable children: Secondary | ICD-10-CM

## 2012-05-04 LAB — HEMOGLOBIN A1C: Hgb A1c MFr Bld: 7.6 % — ABNORMAL HIGH (ref 4.6–6.5)

## 2012-05-04 LAB — BASIC METABOLIC PANEL
BUN: 15 mg/dL (ref 6–23)
CO2: 27 mEq/L (ref 19–32)
Calcium: 9.3 mg/dL (ref 8.4–10.5)
Chloride: 104 mEq/L (ref 96–112)
Creatinine, Ser: 1 mg/dL (ref 0.4–1.5)
GFR: 83.11 mL/min (ref >60.00)
Glucose, Bld: 148 mg/dL — ABNORMAL HIGH (ref 70–99)
Potassium: 4.8 mEq/L (ref 3.5–5.1)
Sodium: 142 mEq/L (ref 135–145)

## 2012-05-11 ENCOUNTER — Ambulatory Visit: Payer: Medicare Other | Admitting: Family Medicine

## 2012-05-14 ENCOUNTER — Ambulatory Visit (INDEPENDENT_AMBULATORY_CARE_PROVIDER_SITE_OTHER): Payer: Medicare Other | Admitting: Family Medicine

## 2012-05-14 ENCOUNTER — Encounter: Payer: Self-pay | Admitting: Family Medicine

## 2012-05-14 VITALS — BP 120/60 | Temp 97.4°F | Wt 266.0 lb

## 2012-05-14 DIAGNOSIS — N138 Other obstructive and reflux uropathy: Secondary | ICD-10-CM | POA: Diagnosis not present

## 2012-05-14 DIAGNOSIS — N401 Enlarged prostate with lower urinary tract symptoms: Secondary | ICD-10-CM | POA: Diagnosis not present

## 2012-05-14 DIAGNOSIS — E109 Type 1 diabetes mellitus without complications: Secondary | ICD-10-CM | POA: Diagnosis not present

## 2012-05-14 DIAGNOSIS — N139 Obstructive and reflux uropathy, unspecified: Secondary | ICD-10-CM

## 2012-05-14 NOTE — Progress Notes (Signed)
  Subjective:    Patient ID: [name], male    DOB: [birth year], 71 y.o.   MRN: [id number]  HPI [name] is a 71 year old married male nonsmoker who comes back today for diabetes type 1  She is on max dose of oral medicine and 15 units of Humalog 7525 at bedtime. Blood sugars still in the 150  range A1c not at goal   Review of Systems General and cardiovascular metabolic review of systems otherwise negative    Objective:   Physical Exam Well-developed well-nourished man no acute distress       Assessment & Plan:  Diabetes type 1 not at goal increase insulin by 5 units every 2 weeks until blood sugar drops to 115 minutes of walking daily followup in 3 months

## 2012-05-14 NOTE — Patient Instructions (Signed)
Increase the insulin by 5 units every 2 weeks and to your blood sugar drops in the 100-110 range  Walk 15 minutes daily  Followup the second week in September for your general physical examination  Nonfasting labs one week prior

## 2012-06-04 ENCOUNTER — Other Ambulatory Visit: Payer: Self-pay | Admitting: *Deleted

## 2012-06-04 MED ORDER — INSULIN SYRINGES (DISPOSABLE) U-100 1 ML MISC: 1.0000 | Freq: Every day | Status: DC

## 2012-06-29 ENCOUNTER — Other Ambulatory Visit: Payer: Self-pay | Admitting: Family Medicine

## 2012-08-27 ENCOUNTER — Telehealth: Payer: Self-pay | Admitting: Family Medicine

## 2012-08-27 MED ORDER — INSULIN LISPRO PROT & LISPRO (75-25 MIX) 100 UNIT/ML ~~LOC~~ SUSP: 25.0000 [IU] | Freq: Every day | SUBCUTANEOUS | Status: DC

## 2012-08-27 NOTE — Telephone Encounter (Signed)
Patient called stating that he need an rx for humalog 75/25 sent to [facility] on [address]. Please assist. Patient is taking 25 units qd. Patient is completely out.

## 2012-08-27 NOTE — Telephone Encounter (Signed)
Rx sent to pharmacy   

## 2012-09-01 ENCOUNTER — Other Ambulatory Visit (INDEPENDENT_AMBULATORY_CARE_PROVIDER_SITE_OTHER): Payer: Medicare Other

## 2012-09-01 DIAGNOSIS — N139 Obstructive and reflux uropathy, unspecified: Secondary | ICD-10-CM | POA: Diagnosis not present

## 2012-09-01 DIAGNOSIS — N401 Enlarged prostate with lower urinary tract symptoms: Secondary | ICD-10-CM

## 2012-09-01 DIAGNOSIS — E109 Type 1 diabetes mellitus without complications: Secondary | ICD-10-CM | POA: Diagnosis not present

## 2012-09-01 DIAGNOSIS — N138 Other obstructive and reflux uropathy: Secondary | ICD-10-CM

## 2012-09-01 LAB — HEMOGLOBIN A1C: Hgb A1c MFr Bld: 7 % — ABNORMAL HIGH (ref 4.6–6.5)

## 2012-09-01 LAB — BASIC METABOLIC PANEL
BUN: 14 mg/dL (ref 6–23)
CO2: 28 mEq/L (ref 19–32)
Calcium: 9.5 mg/dL (ref 8.4–10.5)
Chloride: 102 mEq/L (ref 96–112)
Creatinine, Ser: 0.9 mg/dL (ref 0.4–1.5)
GFR: 94.4 mL/min (ref >60.00)
Glucose, Bld: 113 mg/dL — ABNORMAL HIGH (ref 70–99)
Potassium: 5 mEq/L (ref 3.5–5.1)
Sodium: 139 mEq/L (ref 135–145)

## 2012-09-01 LAB — CBC WITH DIFFERENTIAL/PLATELET
Basophils Absolute: 0 10*3/uL (ref 0.0–0.1)
Basophils Relative: 0.5 % (ref 0.0–3.0)
Eosinophils Absolute: 0.3 10*3/uL (ref 0.0–0.7)
Eosinophils Relative: 3.5 % (ref 0.0–5.0)
HCT: 41.9 % (ref 39.0–52.0)
Hemoglobin: 13.8 g/dL (ref 13.0–17.0)
Lymphocytes Relative: 24.4 % (ref 12.0–46.0)
Lymphs Abs: 2.1 10*3/uL (ref 0.7–4.0)
MCHC: 33 g/dL (ref 30.0–36.0)
MCV: 91.1 fl (ref 78.0–100.0)
Monocytes Absolute: 0.9 10*3/uL (ref 0.1–1.0)
Monocytes Relative: 10.9 % (ref 3.0–12.0)
Neutro Abs: 5.1 10*3/uL (ref 1.4–7.7)
Neutrophils Relative %: 60.7 % (ref 43.0–77.0)
Platelets: 225 10*3/uL (ref 150.0–400.0)
RBC: 4.59 Mil/uL (ref 4.22–5.81)
RDW: 15.4 % — ABNORMAL HIGH (ref 11.5–14.6)
WBC: 8.4 10*3/uL (ref 4.5–10.5)

## 2012-09-01 LAB — POCT URINALYSIS DIPSTICK
Bilirubin, UA: NEGATIVE
Blood, UA: NEGATIVE
Glucose, UA: NEGATIVE
Ketones, UA: NEGATIVE
Leukocytes, UA: NEGATIVE
Nitrite, UA: NEGATIVE
Protein, UA: NEGATIVE
Spec Grav, UA: 1.015
Urobilinogen, UA: 0.2
pH, UA: 6

## 2012-09-01 LAB — HEPATIC FUNCTION PANEL
ALT: 21 U/L (ref 0–53)
AST: 22 U/L (ref 0–37)
Albumin: 3.8 g/dL (ref 3.5–5.2)
Alkaline Phosphatase: 45 U/L (ref 39–117)
Bilirubin, Direct: 0.2 mg/dL (ref 0.0–0.3)
Total Bilirubin: 0.9 mg/dL (ref 0.3–1.2)
Total Protein: 6.7 g/dL (ref 6.0–8.3)

## 2012-09-01 LAB — PSA: PSA: 1.92 ng/mL (ref 0.10–4.00)

## 2012-09-01 LAB — MICROALBUMIN / CREATININE URINE RATIO
Creatinine,U: 37.5 mg/dL
Microalb Creat Ratio: 4 mg/g (ref 0.0–30.0)
Microalb, Ur: 1.5 mg/dL (ref 0.0–1.9)

## 2012-09-01 LAB — LIPID PANEL
Cholesterol: 120 mg/dL (ref 0–200)
HDL: 45.2 mg/dL (ref >39.00)
LDL Cholesterol: 57 mg/dL (ref 0–99)
Total CHOL/HDL Ratio: 3
Triglycerides: 89 mg/dL (ref 0.0–149.0)
VLDL: 17.8 mg/dL (ref 0.0–40.0)

## 2012-09-01 LAB — TSH: TSH: 1.53 u[IU]/mL (ref 0.35–5.50)

## 2012-09-07 ENCOUNTER — Encounter: Payer: Self-pay | Admitting: Family Medicine

## 2012-09-07 ENCOUNTER — Ambulatory Visit (INDEPENDENT_AMBULATORY_CARE_PROVIDER_SITE_OTHER): Payer: Medicare Other | Admitting: Family Medicine

## 2012-09-07 VITALS — BP 120/52 | Temp 97.9°F | Ht 68.5 in | Wt 266.0 lb

## 2012-09-07 DIAGNOSIS — I2510 Atherosclerotic heart disease of native coronary artery without angina pectoris: Secondary | ICD-10-CM

## 2012-09-07 DIAGNOSIS — Z8679 Personal history of other diseases of the circulatory system: Secondary | ICD-10-CM | POA: Diagnosis not present

## 2012-09-07 DIAGNOSIS — E785 Hyperlipidemia, unspecified: Secondary | ICD-10-CM

## 2012-09-07 DIAGNOSIS — Z23 Encounter for immunization: Secondary | ICD-10-CM

## 2012-09-07 DIAGNOSIS — I10 Essential (primary) hypertension: Secondary | ICD-10-CM

## 2012-09-07 DIAGNOSIS — E109 Type 1 diabetes mellitus without complications: Secondary | ICD-10-CM

## 2012-09-07 DIAGNOSIS — R0989 Other specified symptoms and signs involving the circulatory and respiratory systems: Secondary | ICD-10-CM

## 2012-09-07 DIAGNOSIS — E119 Type 2 diabetes mellitus without complications: Secondary | ICD-10-CM

## 2012-09-07 DIAGNOSIS — I6529 Occlusion and stenosis of unspecified carotid artery: Secondary | ICD-10-CM

## 2012-09-07 DIAGNOSIS — E669 Obesity, unspecified: Secondary | ICD-10-CM

## 2012-09-07 MED ORDER — INSULIN SYRINGES (DISPOSABLE) U-100 1 ML MISC: 1.0000 | Freq: Every day | Status: DC

## 2012-09-07 MED ORDER — GLUCOSE BLOOD VI STRP: ORAL_STRIP | Status: DC

## 2012-09-07 MED ORDER — GLIPIZIDE 10 MG PO TABS: 10.0000 mg | ORAL_TABLET | Freq: Two times a day (BID) | ORAL | Status: DC

## 2012-09-07 MED ORDER — INSULIN ASPART PROT & ASPART (70-30 MIX) 100 UNIT/ML ~~LOC~~ SUSP: 25.0000 [IU] | Freq: Every day | SUBCUTANEOUS | Status: DC

## 2012-09-07 MED ORDER — SIMVASTATIN 20 MG PO TABS: 20.0000 mg | ORAL_TABLET | Freq: Every day | ORAL | Status: DC

## 2012-09-07 MED ORDER — NITROGLYCERIN 0.4 MG SL SUBL: 0.4000 mg | SUBLINGUAL_TABLET | SUBLINGUAL | Status: DC | PRN

## 2012-09-07 MED ORDER — METFORMIN HCL 1000 MG PO TABS: 1000.0000 mg | ORAL_TABLET | Freq: Two times a day (BID) | ORAL | Status: DC

## 2012-09-07 MED ORDER — LISINOPRIL 40 MG PO TABS: ORAL_TABLET | ORAL | Status: DC

## 2012-09-07 MED ORDER — POTASSIUM CHLORIDE CRYS ER 20 MEQ PO TBCR: EXTENDED_RELEASE_TABLET | ORAL | Status: DC

## 2012-09-07 MED ORDER — OMEPRAZOLE 20 MG PO CPDR: 20.0000 mg | DELAYED_RELEASE_CAPSULE | Freq: Every day | ORAL | Status: DC

## 2012-09-07 MED ORDER — AMLODIPINE BESYLATE 10 MG PO TABS: 10.0000 mg | ORAL_TABLET | Freq: Every day | ORAL | Status: DC

## 2012-09-07 NOTE — Patient Instructions (Addendum)
Continue your current medications  Remember to walk 30 minutes daily  Followup on your diabetes in 6 months  Motrin 400 mg twice daily with food but only for one week it for your right shoulder pain if the pain persists I would see Dr. Joni Fears orthopedist

## 2012-09-07 NOTE — Progress Notes (Signed)
Subjective:    Patient ID: [name], male    DOB: [birth year], 71 y.o.   MRN: [id number]  HPI [name] is a 71 year old married male nonsmoker still working who comes in today for a Medicare wellness examination  He has a history of underlying hypertension for which he takes Norvasc 10 mg, Tenormin 100 mg twice a day, Lasix 40 mg twice a day, potassium 40 mEq twice a day, lisinopril 60 mg daily and an aspirin tablet daily BP 120/52  His underlying diabetes for which he takes metformin 1000 mg twice a day Glucotrol 10 mg twice a day and insulin 75-25 doses 25 units each bedtime blood sugars are in the 1 09/30/2019 range A1c 7.0%  He has a history of underlying coronary disease stents x2, carotid disease status post surgery, and aortic femoral disease status post surgery currently asymptomatic. Recent cardiac evaluation by Dr. Jonette Mate normal including EKG. He also takes Zocor 20 mg each bedtime for hyperlipidemia Prilosec 20 mg daily for reflux.  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   HENT: Negative.   Eyes: Negative.   Respiratory: Negative.   Cardiovascular: Negative.   Gastrointestinal: Negative.   Genitourinary: Negative.   Musculoskeletal: Negative.   Skin: Negative.   Neurological: Negative.   Hematological: Negative.   Psychiatric/Behavioral: Negative.        Objective:   Physical Exam  Constitutional: He is oriented to person, place, and time. He appears well-developed and well-nourished.  HENT:  Head: Normocephalic and atraumatic.  Right Ear: External ear normal.  Left Ear: External ear normal.  Nose: Nose normal.  Mouth/Throat: Oropharynx is clear and moist.    Eyes: Conjunctivae and EOM are normal. Pupils are equal, round, and reactive to light.  Neck: Normal range of motion. Neck supple. No JVD present. No tracheal deviation present. No thyromegaly present.       Scar left neck soft right and left carotid bruits  Cardiovascular: Normal rate, regular rhythm, normal heart sounds and intact distal pulses.  Exam reveals no gallop and no friction rub.   No murmur heard. Pulmonary/Chest: Effort normal and breath sounds normal. No stridor. No respiratory distress. He has no wheezes. He has no rales. He exhibits no tenderness.  Abdominal: Soft. Bowel sounds are normal. He exhibits no distension and no mass. There is no tenderness. There is no rebound and no guarding.  Genitourinary: Rectum normal, prostate normal and penis normal. Guaiac negative stool. No penile tenderness.  Musculoskeletal: Normal range of motion. He exhibits no edema and no tenderness.       Decreased range of motion right arm consistent with a rotator cuff injury  Lymphadenopathy:    He has no cervical adenopathy.  Neurological: He is alert and oriented to person, place, and time. He has normal reflexes. No cranial nerve deficit. He exhibits normal muscle tone.  Skin: Skin is warm and dry. No rash noted. No erythema. No pallor.  Psychiatric: He has a normal mood and affect. His behavior is normal. Judgment and thought content normal.          Assessment & Plan:  Metabolic syndrome continue current medications  Cardiovascular disease currently asymptomatic  Peripheral vascular disease  currently asymptomatic  Obesity unchanged weight 266 pounds  Rotator cuff injury right shoulder referred to Dr. Durward Fortes when necessary

## 2012-09-09 ENCOUNTER — Other Ambulatory Visit: Payer: Self-pay | Admitting: *Deleted

## 2012-09-09 DIAGNOSIS — E109 Type 1 diabetes mellitus without complications: Secondary | ICD-10-CM

## 2012-09-09 MED ORDER — GLUCOSE BLOOD VI STRP: 1.0000 | ORAL_STRIP | Freq: Every day | Status: DC

## 2012-11-02 DIAGNOSIS — H25019 Cortical age-related cataract, unspecified eye: Secondary | ICD-10-CM | POA: Diagnosis not present

## 2012-11-02 DIAGNOSIS — E870 Hyperosmolality and hypernatremia: Secondary | ICD-10-CM | POA: Diagnosis not present

## 2012-11-02 DIAGNOSIS — E11329 Type 2 diabetes mellitus with mild nonproliferative diabetic retinopathy without macular edema: Secondary | ICD-10-CM | POA: Diagnosis not present

## 2012-11-02 DIAGNOSIS — E1065 Type 1 diabetes mellitus with hyperglycemia: Secondary | ICD-10-CM | POA: Diagnosis not present

## 2013-01-28 ENCOUNTER — Encounter: Payer: Self-pay | Admitting: Nurse Practitioner

## 2013-01-28 ENCOUNTER — Ambulatory Visit (INDEPENDENT_AMBULATORY_CARE_PROVIDER_SITE_OTHER): Payer: Medicare Other | Admitting: Nurse Practitioner

## 2013-01-28 VITALS — BP 140/64 | HR 65 | Ht 70.0 in | Wt 273.8 lb

## 2013-01-28 DIAGNOSIS — R0609 Other forms of dyspnea: Secondary | ICD-10-CM | POA: Diagnosis not present

## 2013-01-28 DIAGNOSIS — R0989 Other specified symptoms and signs involving the circulatory and respiratory systems: Secondary | ICD-10-CM | POA: Diagnosis not present

## 2013-01-28 DIAGNOSIS — R011 Cardiac murmur, unspecified: Secondary | ICD-10-CM | POA: Diagnosis not present

## 2013-01-28 DIAGNOSIS — I2510 Atherosclerotic heart disease of native coronary artery without angina pectoris: Secondary | ICD-10-CM

## 2013-01-28 DIAGNOSIS — R0600 Dyspnea, unspecified: Secondary | ICD-10-CM

## 2013-01-28 DIAGNOSIS — E119 Type 2 diabetes mellitus without complications: Secondary | ICD-10-CM

## 2013-01-28 DIAGNOSIS — I10 Essential (primary) hypertension: Secondary | ICD-10-CM | POA: Diagnosis not present

## 2013-01-28 LAB — BASIC METABOLIC PANEL
BUN: 20 mg/dL (ref 6–23)
CO2: 29 mEq/L (ref 19–32)
Calcium: 9.3 mg/dL (ref 8.4–10.5)
Chloride: 105 mEq/L (ref 96–112)
Creatinine, Ser: 0.8 mg/dL (ref 0.4–1.5)
GFR: 98.28 mL/min (ref >60.00)
Glucose, Bld: 111 mg/dL — ABNORMAL HIGH (ref 70–99)
Potassium: 3.9 mEq/L (ref 3.5–5.1)
Sodium: 140 mEq/L (ref 135–145)

## 2013-01-28 MED ORDER — FUROSEMIDE 40 MG PO TABS: ORAL_TABLET | ORAL | Status: DC

## 2013-01-28 NOTE — Progress Notes (Signed)
Patient Name: [name] Date of Encounter: 01/28/2013  Primary Care Provider:  Joycelyn Man, MD Primary Cardiologist:  T. Wall, MD  Patient Profile  72 year old male with history of CAD presents for follow up secondary to a 6 month history of dyspnea on exertion.  Problem List   Past Medical History  Diagnosis Date  . Diabetes mellitus   . GERD (gastroesophageal reflux disease)   . Hyperlipidemia   . Hypertension   . CVA (cerebral infarction) 2001, 2004  . CAD (coronary artery disease)     a. 06/2007 PCI LAD->Cypher DES;  b. 02/2010 Cath LM 30-40, LAD 40-69m, patent stent, D2 50, RI 70 ost, LCX 50, OM 30, RCA 30-14m, 30d, PDA 50, RPL 40-50p, nl EF.  . Carotid artery occlusion     a. 07/2011 L CEA;  b. 02/2012 Carotid U/S: RICA [id number], LICA patent.  . Peripheral vascular disease 2001  . History of blood transfusion    Past Surgical History  Procedure Date  . Angioplasty 2008, 2010    stent placement 2008  . Kidney stone removal 1969  . Inguinal hernia repair 1962    right  . Carotid endarterectomy   . Pr vein bypass graft,aorto-fem-pop 2008    Allergies  Allergies  Allergen Reactions  . Adhesive (Tape)     rash  . Clopidogrel Bisulfate     REACTION: rash, itching    HPI  72 year old male with the above problem list.  Over the past 6 months, he reports some progression of dyspnea exertion specifically when walking up inclines or stairs.  He has not had any chest pain.  He has chronic lower extremity edema and reports being relatively sedentary.  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  He thinks he's gained about 15 pounds since November as result of being so sedentary.  He occasionally experiences a "twinge" of fleeting chest pain that generally occurs at rest lasting a few seconds without associated symptoms and resolving spontaneously.He denies palpitations, pnd, orthopnea, n, v, dizziness, syncope, or early satiety.  Home  Medications  Prior to Admission medications   Medication Sig Start Date End Date Taking? Authorizing Provider  amLODipine (NORVASC) 10 MG tablet Take 1 tablet (10 mg total) by mouth daily. 09/07/12  Yes Dorena Cookey, MD  aspirin 325 MG tablet Take 325 mg by mouth 2 (two) times daily before a meal.     Yes Historical Provider, MD  atenolol (TENORMIN) 100 MG tablet Take 1 tablet (100 mg total) by mouth 2 (two) times daily. 1 tab po bid 08/21/11  Yes Dorena Cookey, MD  furosemide (LASIX) 40 MG tablet Take 40 mg in the am and 20 mg in the pm. 01/28/13  Yes Rogelia Mire, NP  glipiZIDE (GLUCOTROL) 10 MG tablet Take 1 tablet (10 mg total) by mouth 2 (two) times daily before a meal. 09/07/12  Yes Dorena Cookey, MD  glucose blood (ONE TOUCH TEST STRIPS) test strip 1 each by Other route daily. 09/09/12  Yes Dorena Cookey, MD  INSULIN SYRINGE 1CC/29G (B-D INSULIN SYRINGE) 29G X 1/2" 1 ML MISC 1 each by Does not apply route 1 day or 1 dose. 08/21/11  Yes Dorena Cookey, MD  lisinopril (PRINIVIL,ZESTRIL) 40 MG tablet 1 1/2 tablets daily 09/07/12  Yes Dorena Cookey, MD  metFORMIN (GLUCOPHAGE) 1000 MG tablet Take 1 tablet (1,000 mg total) by mouth 2 (two) times daily with a meal. 09/07/12  Yes  Dorena Cookey, MD  Multiple Vitamin (MULTIVITAMIN) tablet Take 1 tablet by mouth daily.     Yes Historical Provider, MD  nitroGLYCERIN (NITROSTAT) 0.4 MG SL tablet Place 1 tablet (0.4 mg total) under the tongue every 5 (five) minutes as needed. 09/07/12  Yes Dorena Cookey, MD  omeprazole (PRILOSEC) 20 MG capsule Take 1 capsule (20 mg total) by mouth daily. 09/07/12  Yes Dorena Cookey, MD  potassium chloride SA (KLOR-CON M20) 20 MEQ tablet 2 tabs twice a day 09/07/12  Yes Dorena Cookey, MD  simvastatin (ZOCOR) 20 MG tablet Take 1 tablet (20 mg total) by mouth at bedtime. 09/07/12  Yes Dorena Cookey, MD  insulin aspart protamine-insulin aspart (NOVOLOG MIX 70/30) (70-30) 100 UNIT/ML injection Inject 25 Units into the skin daily  with supper. As directed 09/07/12   Dorena Cookey, MD  insulin lispro protamine-insulin lispro (HUMALOG 75/25) (75-25) 100 UNIT/ML SUSP Inject 25 Units into the skin daily with supper. 08/27/12   Dorena Cookey, MD    Review of Systems  He has dyspnea on exertion and chronic lower extremity edema as outlined above.  He has not had chest pain, palpitations, PND, orthopnea, dizziness, syncope, or early satiety.  He occasionally notes paresthesias in his feet and coolness to his hands first thing in the morning.  All other systems reviewed and are otherwise negative except as noted above.  Physical Exam  Blood pressure 140/64, pulse 65, height 5\' 10"  (1.778 m), weight 273 lb 12 oz (124.172 kg).  General: Pleasant, NAD Psych: Normal affect. Neuro: Alert and oriented X 3. Moves all extremities spontaneously. HEENT: Normal  Neck: Supple without bruits or JVD. Lungs:  Resp regular and unlabored, CTA. Heart: RRR no s3, s4.  He has a 2/6 systolic ejection murmur loudest at the right upper sternal border. Abdomen: Soft, non-tender, non-distended, BS + x 4.  Extremities: No clubbing, cyanosis.  He has 2+ bilateral lower extremity edema to the knee with some chronic venous stasis changes. DP/PT/Radials 1+ and equal bilaterally.  Accessory Clinical Findings  ECG - regular sinus rhythm, 65, poor R wave progression, no acute ST-T changes.  Assessment & Plan  1.  Dyspnea on exertion/CAD: patient reports a six-month history of progressive dyspnea exertion especially with inclines or stairs.  He has had occasional twinges of chest discomfort at rest which are fleeting in nature.  Previous anginal equivalent was dyspnea.  We will arrange for an exercise Cardiolite to rule out ischemia.  As he has a systolic murmur we will also arrange for an echocardiogram to evaluate LV function.  2.  Hypertension: This is poorly controlled.  Patient says his blood pressure at home is typically 140 to 160.  He reports a  regular does not eat and is not easily watch his sodium intake.  He has also gained weight and has chronic lower extremity edema.  I going to check a basic metabolic panel today.  We'll increase his Lasix dose from 49 g daily to 40 mg in the a.m. And 20 mg in the p.m.  He'll continue to check his blood pressure at home.  3.  Occasional paresthesias: Patient reports paresthesias in his feet in the mornings.  He is diabetic and certainly would question if this is playing a role.  He has followup with vascular surgery in March.  4.  History of carotid artery disease: His last ultrasound was one year ago has followup with vascular surgery in March.  He has  no bruits today.  5.  Hyperlipidemia: Continue statin therapy.  6.  Diabetes mellitus: Per internal medicine.  7.  Disposition: Followup in approximately 2 weeks after testing is completed.  Murray Hodgkins, NP 01/28/2013, 10:42 AM

## 2013-01-28 NOTE — Patient Instructions (Addendum)
Your physician recommends that you return for lab work in: today (bmet)  Your physician has requested that you have an echocardiogram. Echocardiography is a painless test that uses sound waves to create images of your heart. It provides your doctor with information about the size and shape of your heart and how well your heart's chambers and valves are working. This procedure takes approximately one hour. There are no restrictions for this procedure.  Your physician has requested that you have en exercise stress myoview. For further information please visit [email or web address]. Please follow instruction sheet, as given.  Your physician has recommended you make the following change in your medication: START Lasix 40 mg am and 20 mg pm.  Your physician recommends that you schedule a follow-up appointment in: 2 weeks with Richardson Dopp PA-C

## 2013-02-02 ENCOUNTER — Ambulatory Visit (HOSPITAL_COMMUNITY): Payer: Medicare Other | Attending: Cardiology

## 2013-02-02 ENCOUNTER — Encounter: Payer: Self-pay | Admitting: Cardiology

## 2013-02-02 DIAGNOSIS — E785 Hyperlipidemia, unspecified: Secondary | ICD-10-CM | POA: Diagnosis not present

## 2013-02-02 DIAGNOSIS — R0609 Other forms of dyspnea: Secondary | ICD-10-CM | POA: Insufficient documentation

## 2013-02-02 DIAGNOSIS — R011 Cardiac murmur, unspecified: Secondary | ICD-10-CM | POA: Diagnosis not present

## 2013-02-02 DIAGNOSIS — I10 Essential (primary) hypertension: Secondary | ICD-10-CM | POA: Insufficient documentation

## 2013-02-02 DIAGNOSIS — E119 Type 2 diabetes mellitus without complications: Secondary | ICD-10-CM | POA: Insufficient documentation

## 2013-02-02 DIAGNOSIS — R0989 Other specified symptoms and signs involving the circulatory and respiratory systems: Secondary | ICD-10-CM | POA: Insufficient documentation

## 2013-02-02 DIAGNOSIS — R0600 Dyspnea, unspecified: Secondary | ICD-10-CM

## 2013-02-02 DIAGNOSIS — Z8673 Personal history of transient ischemic attack (TIA), and cerebral infarction without residual deficits: Secondary | ICD-10-CM | POA: Diagnosis not present

## 2013-02-02 DIAGNOSIS — I2510 Atherosclerotic heart disease of native coronary artery without angina pectoris: Secondary | ICD-10-CM | POA: Insufficient documentation

## 2013-02-02 NOTE — Progress Notes (Signed)
Echocardiogram performed.  

## 2013-02-04 ENCOUNTER — Ambulatory Visit (HOSPITAL_COMMUNITY): Payer: Medicare Other | Attending: Cardiology | Admitting: Radiology

## 2013-02-04 VITALS — BP 138/68 | Ht 70.0 in | Wt 270.0 lb

## 2013-02-04 DIAGNOSIS — I10 Essential (primary) hypertension: Secondary | ICD-10-CM | POA: Insufficient documentation

## 2013-02-04 DIAGNOSIS — R079 Chest pain, unspecified: Secondary | ICD-10-CM | POA: Insufficient documentation

## 2013-02-04 DIAGNOSIS — I2510 Atherosclerotic heart disease of native coronary artery without angina pectoris: Secondary | ICD-10-CM

## 2013-02-04 DIAGNOSIS — R0609 Other forms of dyspnea: Secondary | ICD-10-CM | POA: Insufficient documentation

## 2013-02-04 DIAGNOSIS — R0602 Shortness of breath: Secondary | ICD-10-CM

## 2013-02-04 DIAGNOSIS — E119 Type 2 diabetes mellitus without complications: Secondary | ICD-10-CM | POA: Insufficient documentation

## 2013-02-04 DIAGNOSIS — R011 Cardiac murmur, unspecified: Secondary | ICD-10-CM

## 2013-02-04 DIAGNOSIS — I739 Peripheral vascular disease, unspecified: Secondary | ICD-10-CM | POA: Insufficient documentation

## 2013-02-04 DIAGNOSIS — R0989 Other specified symptoms and signs involving the circulatory and respiratory systems: Secondary | ICD-10-CM | POA: Insufficient documentation

## 2013-02-04 DIAGNOSIS — R0600 Dyspnea, unspecified: Secondary | ICD-10-CM

## 2013-02-04 MED ORDER — TECHNETIUM TC 99M SESTAMIBI GENERIC - CARDIOLITE
33.0000 | Freq: Once | INTRAVENOUS | Status: AC | PRN
  Administered 2013-02-04: 33 via INTRAVENOUS

## 2013-02-04 MED ORDER — TECHNETIUM TC 99M SESTAMIBI GENERIC - CARDIOLITE
11.0000 | Freq: Once | INTRAVENOUS | Status: AC | PRN
  Administered 2013-02-04: 11 via INTRAVENOUS

## 2013-02-04 NOTE — Progress Notes (Signed)
[facility] SITE 3 NUCLEAR MED [address] [place], [place] [address] [phone]    Cardiology Nuclear Med Study  [name] is a 72 y.o. male     MRN : [id number]     DOB: [birth year]  Procedure Date: 02/04/2013  Nuclear Med Background Indication for Stress Test:  Evaluation for Ischemia, Stent Patency and PTCA Patency History: '08 PTCA, 7/08 Stent LAD, '09 Myocardial Perfusion Study-no clear ischemia or scar, EF=62%, 3/11 Cath: EF=55-60%, moderate Diabetic disease progression LAD, and RCA from previous study,70-80% LAD, patent mid-LAD stent , 02-02-13 Echo:EF=55-65%, mild LVH Cardiac Risk Factors: Carotid Disease, CVA, Hypertension, IDDM Type 2, Lipids, Obesity and PVD  Symptoms: Chest Pain with/without exertion (last occurrence last week),  DOE, Fatigue, Fatigue with Exertion and SOB   Nuclear Pre-Procedure Caffeine/Decaff Intake:  None NPO After: 10:00pm   Lungs:  clear O2 Sat: 96% on room air. IV 0.9% NS with Angio Cath:  20g  IV Site: R Antecubital  IV Started by:  Eliezer Lofts, EMT-P  Chest Size (in):  50 Cup Size: n/a  Height: 5\' 10"  (1.778 m)  Weight:  270 lb (122.471 kg)  BMI:  Body mass index is 38.74 kg/(m^2). Tech Comments:  Atenolol held > 24 hours, per patient. Discussed with Dr. Caryl Comes that the patient had chest pain @ peak exercise that resolved slowly in recovery with EKG changes. Dr Caryl Comes reviewed preliminary images with ok for patient to leave but follow-up with Dr.Wall. Appointment with Richardson Dopp, PAC moved to 02-10-13 at 12:10 (day that Dr Verl Blalock in office).Irven Baltimore, RN.    Nuclear Med Study 1 or 2 day study: 1 day  Stress Test Type:  Stress  Reading MD: Darlin Coco, MD  Order Authorizing Provider:  Jenell Milliner, MD, and Murray Hodgkins, NP  Resting Radionuclide: Technetium 82m Sestamibi  Resting Radionuclide Dose: 11.0 mCi   Stress Radionuclide:  Technetium 29m Sestamibi  Stress Radionuclide Dose: 33.0 mCi           Stress  Protocol Rest HR: 69 Stress HR: 139  Rest BP: 138/68 Stress BP: 206/82  Exercise Time (min): 4:00 METS: 5.8   Predicted Max HR: 149 bpm % Max HR: 93.29 bpm Rate Pressure Product: [id number]    Dose of Adenosine (mg):  n/a Dose of Lexiscan: n/a mg  Dose of Atropine (mg): n/a Dose of Dobutamine: n/a mcg/kg/min (at max HR)  Stress Test Technologist: Irven Baltimore, RN  Nuclear Technologist:  Charlton Amor, CNMT     Rest Procedure:  Myocardial perfusion imaging was performed at rest 45 minutes following the intravenous administration of Technetium 24m Sestamibi. Rest ECG: NSR - Normal EKG  Stress Procedure:  The patient exercised on the treadmill utilizing the Bruce Protocol for 4 minutes, RPE=15. The patient stopped due to DOE and complained of chest pain 3/10 @ peak exercise. There was a hypertensive response to exercise.Technetium 15m Sestamibi was injected at peak exercise and myocardial perfusion imaging was performed after a brief delay. Stress ECG: Post-exercise downsloping ST segments in inferolateral leads.  QPS Raw Data Images:  Normal; no motion artifact; normal heart/lung ratio. Stress Images:  Normal homogeneous uptake in all areas of the myocardium. Rest Images:  Normal homogeneous uptake in all areas of the myocardium. Subtraction (SDS):  No evidence of ischemia. Transient Ischemic Dilatation (Normal <1.22):  1.13 Lung/Heart Ratio (Normal <0.45):  0.36  Quantitative Gated Spect Images QGS EDV:  151 ml QGS ESV:  61 ml  Impression Exercise Capacity:  Poor exercise capacity. BP Response:  Hypertensive blood pressure response. Clinical Symptoms:  Mild chest pain/dyspnea. ECG Impression:  Downsloping ST segments in inferolateral leads post exercise Comparison with Prior Nuclear Study: No images to compare  Overall Impression:  Low risk stress nuclear study. Perfusion images show no evidence of ischemia. Hypertensive response to exercise may explain the transient  post-exercise ST changes.  LV Ejection Fraction: 59%.  LV Wall Motion:  NL LV Function; NL Wall Motion  [employer]

## 2013-02-10 ENCOUNTER — Ambulatory Visit (INDEPENDENT_AMBULATORY_CARE_PROVIDER_SITE_OTHER): Payer: Medicare Other | Admitting: Physician Assistant

## 2013-02-10 VITALS — BP 142/62 | HR 59 | Ht 70.0 in | Wt 272.0 lb

## 2013-02-10 DIAGNOSIS — I10 Essential (primary) hypertension: Secondary | ICD-10-CM

## 2013-02-10 DIAGNOSIS — R609 Edema, unspecified: Secondary | ICD-10-CM | POA: Diagnosis not present

## 2013-02-10 DIAGNOSIS — I2510 Atherosclerotic heart disease of native coronary artery without angina pectoris: Secondary | ICD-10-CM | POA: Diagnosis not present

## 2013-02-10 DIAGNOSIS — R0602 Shortness of breath: Secondary | ICD-10-CM | POA: Diagnosis not present

## 2013-02-10 LAB — BASIC METABOLIC PANEL
BUN: 19 mg/dL (ref 6–23)
CO2: 27 mEq/L (ref 19–32)
Calcium: 9.1 mg/dL (ref 8.4–10.5)
Chloride: 102 mEq/L (ref 96–112)
Creatinine, Ser: 1 mg/dL (ref 0.4–1.5)
GFR: 82.92 mL/min (ref >60.00)
Glucose, Bld: 180 mg/dL — ABNORMAL HIGH (ref 70–99)
Potassium: 3.8 mEq/L (ref 3.5–5.1)
Sodium: 138 mEq/L (ref 135–145)

## 2013-02-10 LAB — BRAIN NATRIURETIC PEPTIDE: Pro B Natriuretic peptide (BNP): 27 pg/mL (ref 0.0–100.0)

## 2013-02-10 MED ORDER — FUROSEMIDE 40 MG PO TABS: ORAL_TABLET | ORAL | Status: DC

## 2013-02-10 NOTE — Progress Notes (Signed)
[address], [place], [place]  [address] Phone: [phone], Fax:  [phone]  Date:  02/10/2013   ID:  [name], DOB 05-Apr-1941, MRN [id number]  PCP:  Joycelyn Man, MD  Primary Cardiologist:  Dr. Jenell Milliner     History of Present Illness: [name] is a 72 y.o. male who returns for follow up.  He has a hx of CAD, s/p Cypher DES to the LAD 7/08, carotid stenosis, status post left CEA 3/13, DM2, HTN, HL, prior stroke.  He was recently seen on 01/28/13 by Murray Hodgkins, NP b/c of dyspnea with exertion. He also reported some chest discomfort. Echocardiogram and stress test were both arranged. His weight was up and he has chronic LE edema. His Lasix was adjusted as well. Echocardiogram 2/14: Mild LVH, EF [id number], grade 1 diastolic dysfunction, mild to moderate LAE, mild RAE. ETT-Myoview 02/04/13: Patient exercised for 4 minutes and complained of chest pain at peak exercise. ECG was felt to be abnormal with post exercise downsloping ST segments in the inferolateral leads. Images were normal with no evidence of ischemia, EF 59%. Maximum blood pressure 206/82. Dr. Mare Ferrari read the study and felt hypertensive response to exercise may explain the transient post exercise ST changes.  Doing well.  He has occasional exertional chest pain that feels like a pull.  It resolves promptly with rest.  Notes Class II-IIb dyspnea.  No orthopnea, PND.  LE edema stable.  No chest pain.  He feels better since increasing lasix and having stress test done. Has had a lot of stress lately.  Labs (1/14):  K 3.9, creatinine 0.8  Wt Readings from Last 3 Encounters:  02/10/13 272 lb (123.378 kg)  02/04/13 270 lb (122.471 kg)  01/28/13 273 lb 12 oz (124.172 kg)     Past Medical History  Diagnosis Date  . Diabetes mellitus   . GERD (gastroesophageal reflux disease)   . Hyperlipidemia   . Hypertension   . CVA (cerebral infarction) 2001, 2004  . CAD (coronary  artery disease)     a. 06/2007 PCI LAD->Cypher DES;  b. 02/2010 Cath LM 30-40, LAD 40-65m, patent stent, D2 50, RI 70 ost, LCX 50, OM 30, RCA 30-22m, 30d, PDA 50, RPL 40-50p, nl EF.  . Carotid artery occlusion     a. 07/2011 L CEA;  b. 02/2012 Carotid U/S: RICA 123456, LICA patent.  . Peripheral vascular disease 2001  . History of blood transfusion     Current Outpatient Prescriptions  Medication Sig Dispense Refill  . amLODipine (NORVASC) 10 MG tablet Take 1 tablet (10 mg total) by mouth daily.  100 tablet  3  . aspirin 325 MG tablet Take 325 mg by mouth 2 (two) times daily before a meal.        . atenolol (TENORMIN) 100 MG tablet Take 1 tablet (100 mg total) by mouth 2 (two) times daily. 1 tab po bid  200 tablet  3  . furosemide (LASIX) 40 MG tablet Take 40 mg in the am and 20 mg in the pm.  135 tablet  1  . glipiZIDE (GLUCOTROL) 10 MG tablet Take 1 tablet (10 mg total) by mouth 2 (two) times daily before a meal.  200 tablet  3  . insulin aspart protamine-insulin aspart (NOVOLOG MIX 70/30) (70-30) 100 UNIT/ML injection Inject 25 Units into the skin daily with supper. As directed  10 mL  6  . lisinopril (PRINIVIL,ZESTRIL) 40 MG tablet 40 mg daily.      [name]  metFORMIN (GLUCOPHAGE) 1000 MG tablet Take 1 tablet (1,000 mg total) by mouth 2 (two) times daily with a meal.  200 tablet  3  . Multiple Vitamin (MULTIVITAMIN) tablet Take 1 tablet by mouth daily.        . nitroGLYCERIN (NITROSTAT) 0.4 MG SL tablet Place 1 tablet (0.4 mg total) under the tongue every 5 (five) minutes as needed.  25 tablet  1  . omeprazole (PRILOSEC) 20 MG capsule Take 1 capsule (20 mg total) by mouth daily.  90 capsule  3  . potassium chloride SA (KLOR-CON M20) 20 MEQ tablet 2 tabs twice a day  360 tablet  3  . simvastatin (ZOCOR) 20 MG tablet Take 1 tablet (20 mg total) by mouth at bedtime.  90 tablet  3  . glucose blood (ONE TOUCH TEST STRIPS) test strip 1 each by Other route daily.  100 each  12  . INSULIN SYRINGE 1CC/29G  (B-D INSULIN SYRINGE) 29G X 1/2" 1 ML MISC 1 each by Does not apply route 1 day or 1 dose.  100 each  3   No current facility-administered medications for this visit.    Allergies:    Allergies  Allergen Reactions  . Adhesive (Tape)     rash  . Clopidogrel Bisulfate     REACTION: rash, itching    Social History:  The patient  reports that he has never smoked. He does not have any smokeless tobacco history on file. He reports that he does not drink alcohol or use illicit drugs.   ROS:  Please see the history of present illness.   He does snore and has hx of daytime hypersomnolence.   All other systems reviewed and negative.   PHYSICAL EXAM: VS:  BP 142/62  Pulse 59  Ht 5\' 10"  (1.778 m)  Wt 272 lb (123.378 kg)  BMI 39.03 kg/m2 Well nourished, well developed, in no acute distress HEENT: normal Neck: no JVD Cardiac:  normal S1, S2; RRR; no murmur Lungs:  clear to auscultation bilaterally, no wheezing, rhonchi or rales Abd: soft, nontender, no hepatomegaly Ext: 1-2+ bilateral LE edema Skin: warm and dry Neuro:  CNs 2-12 intact, no focal abnormalities noted  EKG:  Sinus brady, HR 59, LAD, IVCD, no change from prior     ASSESSMENT AND PLAN:  1. Dyspnea:  I suspect this is multifactorial.  Stress test was low risk.  Echo did show mild diastolic dysfunction.  He feels better with higher dose lasix.  Will continue this.  Will go ahead and check a BNP today.  I suspect his weight gain is also contributing.  I have suggested he lose weight. 2. Edema:  Continue Lasix 60 mg QD.  Check BMET today. 3. Coronary Artery Disease:  I offered him Imdur to take but he is not interested in trying this.  Continue ASA and statin.   4. Snoring:  His atria are enlarged.  I have recommended sleep study.  He will call back when he is ready to schedule this.   5. Hypertension:  Controlled.  Continue current therapy. 6. Hyperlipidemia:  Continue statin. 7. Disposition:  Follow up with Dr. Jenell Milliner in  6 mos.  Danton Sewer, PA-C  12:34 PM 02/10/2013

## 2013-02-10 NOTE — Patient Instructions (Addendum)
LAB TODAY; BMET, BNP  TAKE LASIX 60 MG ONCE DAILY  CALL BACK TO SCHEDULE SPLIT NIGHT SLEEP STUDY [phone]  FOLLOW UP WITH DR. WALL IN 6 MONTHS

## 2013-02-11 ENCOUNTER — Ambulatory Visit: Payer: Medicare Other | Admitting: Physician Assistant

## 2013-02-15 ENCOUNTER — Telehealth: Payer: Self-pay | Admitting: *Deleted

## 2013-02-15 NOTE — Telephone Encounter (Signed)
pt's wife notified about lab results with verbal understanding and said she will pass the message on to pt.

## 2013-02-15 NOTE — Telephone Encounter (Signed)
Message copied by Michae Kava on Mon Feb 15, 2013 12:47 PM ------      Message from: [place], [place] T      Created: Fri Feb 12, 2013 11:48 AM       K+ and creatinine stable      Continue with current treatment plan.      Richardson Dopp, PA-C  11:48 AM 02/12/2013 ------

## 2013-02-26 ENCOUNTER — Other Ambulatory Visit: Payer: Self-pay | Admitting: Family Medicine

## 2013-03-01 ENCOUNTER — Other Ambulatory Visit (INDEPENDENT_AMBULATORY_CARE_PROVIDER_SITE_OTHER): Payer: Medicare Other

## 2013-03-01 DIAGNOSIS — E109 Type 1 diabetes mellitus without complications: Secondary | ICD-10-CM

## 2013-03-01 LAB — BASIC METABOLIC PANEL
BUN: 22 mg/dL (ref 6–23)
CO2: 27 mEq/L (ref 19–32)
Calcium: 9.1 mg/dL (ref 8.4–10.5)
Chloride: 104 mEq/L (ref 96–112)
Creatinine, Ser: 0.9 mg/dL (ref 0.4–1.5)
GFR: 91.77 mL/min (ref >60.00)
Glucose, Bld: 154 mg/dL — ABNORMAL HIGH (ref 70–99)
Potassium: 4.4 mEq/L (ref 3.5–5.1)
Sodium: 139 mEq/L (ref 135–145)

## 2013-03-01 LAB — HEMOGLOBIN A1C: Hgb A1c MFr Bld: 7.4 % — ABNORMAL HIGH (ref 4.6–6.5)

## 2013-03-08 ENCOUNTER — Ambulatory Visit: Payer: Medicare Other | Admitting: Family Medicine

## 2013-03-10 ENCOUNTER — Other Ambulatory Visit (INDEPENDENT_AMBULATORY_CARE_PROVIDER_SITE_OTHER): Payer: Medicare Other | Admitting: *Deleted

## 2013-03-10 ENCOUNTER — Other Ambulatory Visit: Payer: Medicare Other

## 2013-03-10 ENCOUNTER — Ambulatory Visit: Payer: Medicare Other | Admitting: Neurosurgery

## 2013-03-10 DIAGNOSIS — Z48812 Encounter for surgical aftercare following surgery on the circulatory system: Secondary | ICD-10-CM | POA: Diagnosis not present

## 2013-03-10 DIAGNOSIS — I6529 Occlusion and stenosis of unspecified carotid artery: Secondary | ICD-10-CM | POA: Diagnosis not present

## 2013-03-11 ENCOUNTER — Encounter: Payer: Self-pay | Admitting: Vascular Surgery

## 2013-03-11 ENCOUNTER — Other Ambulatory Visit: Payer: Self-pay | Admitting: *Deleted

## 2013-03-11 DIAGNOSIS — Z48812 Encounter for surgical aftercare following surgery on the circulatory system: Secondary | ICD-10-CM

## 2013-03-18 ENCOUNTER — Ambulatory Visit: Payer: Medicare Other | Admitting: Cardiology

## 2013-04-06 ENCOUNTER — Ambulatory Visit: Payer: Medicare Other | Admitting: Family Medicine

## 2013-04-13 ENCOUNTER — Encounter: Payer: Self-pay | Admitting: Family Medicine

## 2013-04-13 ENCOUNTER — Ambulatory Visit (INDEPENDENT_AMBULATORY_CARE_PROVIDER_SITE_OTHER): Payer: Medicare Other | Admitting: Family Medicine

## 2013-04-13 VITALS — BP 140/80 | Temp 97.7°F | Wt 266.0 lb

## 2013-04-13 DIAGNOSIS — S40269A Insect bite (nonvenomous) of unspecified shoulder, initial encounter: Secondary | ICD-10-CM | POA: Diagnosis not present

## 2013-04-13 DIAGNOSIS — S40869A Insect bite (nonvenomous) of unspecified upper arm, initial encounter: Secondary | ICD-10-CM | POA: Insufficient documentation

## 2013-04-13 DIAGNOSIS — S40861A Insect bite (nonvenomous) of right upper arm, initial encounter: Secondary | ICD-10-CM

## 2013-04-13 DIAGNOSIS — W57XXXA Bitten or stung by nonvenomous insect and other nonvenomous arthropods, initial encounter: Secondary | ICD-10-CM | POA: Insufficient documentation

## 2013-04-13 MED ORDER — DOXYCYCLINE HYCLATE 100 MG PO TABS: 100.0000 mg | ORAL_TABLET | Freq: Two times a day (BID) | ORAL | Status: DC

## 2013-04-13 MED ORDER — INSULIN LISPRO PROT & LISPRO (75-25 MIX) 100 UNIT/ML ~~LOC~~ SUSP: 25.0000 [IU] | Freq: Every day | SUBCUTANEOUS | Status: DC

## 2013-04-13 NOTE — Patient Instructions (Signed)
Within the next 2 weeks if you develop fever begin the doxycycline one twice daily immediately

## 2013-04-13 NOTE — Progress Notes (Signed)
  Subjective:    Patient ID: [name], male    DOB: October 29, 1941, 72 y.o.   MRN: [id number]  HPI [name] is a 72 year old male who comes in today for tick bite on his right axillary today  He noticed a tick on his right arm today and call the main appointment he did not attempt to remove it   Review of Systems    he did have tick fever a couple years ago Objective:   Physical Exam Well-developed well-nourished male no acute distress examination the on shows a thick right axillary a it was drowned with some liquid soap in the tic removed       Assessment & Plan:  Tick bite right axillary area plan observe doxycycline twice a day for 3 weeks if any fever

## 2013-09-07 ENCOUNTER — Other Ambulatory Visit: Payer: Self-pay | Admitting: Family Medicine

## 2013-11-29 ENCOUNTER — Other Ambulatory Visit: Payer: Self-pay | Admitting: Family Medicine

## 2013-12-15 ENCOUNTER — Encounter: Payer: Self-pay | Admitting: Family Medicine

## 2013-12-15 ENCOUNTER — Ambulatory Visit (INDEPENDENT_AMBULATORY_CARE_PROVIDER_SITE_OTHER): Payer: Medicare Other | Admitting: Family Medicine

## 2013-12-15 VITALS — BP 150/90 | HR 71 | Temp 98.2°F | Wt 268.0 lb

## 2013-12-15 DIAGNOSIS — Z23 Encounter for immunization: Secondary | ICD-10-CM

## 2013-12-15 DIAGNOSIS — J069 Acute upper respiratory infection, unspecified: Secondary | ICD-10-CM | POA: Diagnosis not present

## 2013-12-15 MED ORDER — HYDROCODONE-HOMATROPINE 5-1.5 MG/5ML PO SYRP: 5.0000 mL | ORAL_SOLUTION | Freq: Three times a day (TID) | ORAL | Status: DC | PRN

## 2013-12-15 NOTE — Patient Instructions (Signed)
Drink lots of water  2 Tylenol 3 times daily  Chloraseptic lozeng  Hydromet............[name] 1/2-1 teaspoon 3 times daily when necessary for cough and cold symptoms  Return when necessary.

## 2013-12-15 NOTE — Addendum Note (Signed)
Addended by: Westley Hummer B on: 12/15/2013 12:51 PM   Modules accepted: Orders

## 2013-12-15 NOTE — Progress Notes (Signed)
   Subjective:    Patient ID: [name], male    DOB: November 05, 1941, 72 y.o.   MRN: [id number]  HPI Marino is a 72 year old married male nonsmoker who comes in today for evaluation of a cough and congestion runny nose and sore throat for one week  No fever   Review of Systems    review of systems negative Objective:   Physical Exam   Well-developed and nourished male no acute distress vital signs stable he is afebrile ENT negative neck was supple lungs are clear     Assessment & Plan:  Viral syndrome treat symptomatically

## 2014-01-31 ENCOUNTER — Other Ambulatory Visit: Payer: Self-pay | Admitting: Vascular Surgery

## 2014-01-31 DIAGNOSIS — Z48812 Encounter for surgical aftercare following surgery on the circulatory system: Secondary | ICD-10-CM

## 2014-01-31 DIAGNOSIS — I6529 Occlusion and stenosis of unspecified carotid artery: Secondary | ICD-10-CM

## 2014-03-10 ENCOUNTER — Encounter: Payer: Self-pay | Admitting: Family

## 2014-03-11 ENCOUNTER — Ambulatory Visit (INDEPENDENT_AMBULATORY_CARE_PROVIDER_SITE_OTHER): Payer: Medicare Other | Admitting: Family

## 2014-03-11 ENCOUNTER — Encounter: Payer: Self-pay | Admitting: Family

## 2014-03-11 ENCOUNTER — Encounter (INDEPENDENT_AMBULATORY_CARE_PROVIDER_SITE_OTHER): Payer: Self-pay

## 2014-03-11 ENCOUNTER — Ambulatory Visit (HOSPITAL_COMMUNITY)
Admission: RE | Admit: 2014-03-11 | Discharge: 2014-03-11 | Disposition: A | Discharge status: Home / Self Care | Payer: Medicare Other | Source: Ambulatory Visit | Attending: Family | Admitting: Family

## 2014-03-11 VITALS — BP 145/65 | HR 57 | Resp 16 | Ht 69.5 in | Wt 269.0 lb

## 2014-03-11 DIAGNOSIS — Z48812 Encounter for surgical aftercare following surgery on the circulatory system: Secondary | ICD-10-CM | POA: Insufficient documentation

## 2014-03-11 DIAGNOSIS — I6529 Occlusion and stenosis of unspecified carotid artery: Secondary | ICD-10-CM | POA: Diagnosis not present

## 2014-03-11 NOTE — Progress Notes (Signed)
Established Carotid Patient   History of Present Illness  [name] is a 73 y.o. male patient of Dr. Scot Dock who is s/p left CEA on 08/23/2011. He returns today for follow up.  Patient has Positive history of  stroke symptom, hemorrhagic stroke as manifested by leaning to the left, resolved in a week.  The patient denies amaurosis fugax or monocular blindness.  The patient  denies facial drooping.  Pt. denies hemiplegia.  The patient denies receptive or expressive aphasia.  Pt. denies extremity weakness. He denies claudication symptoms, he had a cardiac stent placed, denies history of MI.[name]   Patient denies New Medical or Surgical History.  Pt Diabetic: Yes, states under control Pt smoker: non-smoker  Pt meds include: Statin : Yes ASA: Yes Other anticoagulants/antiplatelets: no   Past Medical History  Diagnosis Date  . Diabetes mellitus   . GERD (gastroesophageal reflux disease)   . Hyperlipidemia   . Hypertension   . CVA (cerebral infarction) 2001, 2004  . CAD (coronary artery disease)     a. 06/2007 PCI LAD->Cypher DES;  b. 02/2010 Cath LM 30-40, LAD 40-19m, patent stent, D2 50, RI 70 ost, LCX 50, OM 30, RCA 30-32m, 30d, PDA 50, RPL 40-50p, nl EF.  . Carotid artery occlusion     a. 07/2011 L CEA;  b. 02/2012 Carotid U/S: RICA [id number], LICA patent.  . Peripheral vascular disease 2001  . History of blood transfusion   . Stroke     Social History History  Substance Use Topics  . Smoking status: Never Smoker   . Smokeless tobacco: Never Used  . Alcohol Use: No    Family History Family History  Problem Relation Age of Onset  . Heart disease Mother     CHF Heart Disease before age 88  . Heart disease Father     Heart Disease before age 13  . Cancer Brother     Lung  . Cancer Brother     Lung  . COPD Brother     Surgical History Past Surgical History  Procedure Laterality Date  . Angioplasty  2008, 2010    stent placement 2008  . Kidney stone removal   1969  . Inguinal hernia repair  1962    right  . Pr vein bypass graft,aorto-fem-pop  2008  . Carotid endarterectomy Left Aug. 24, 2012    cea  . Heart stent  06-30-07    Allergies  Allergen Reactions  . Clopidogrel Bisulfate Itching    REACTION: rash, itching  . Adhesive [Tape] Rash    rash    Current Outpatient Prescriptions  Medication Sig Dispense Refill  . amLODipine (NORVASC) 10 MG tablet TAKE 1 TABLET EVERY DAY  100 tablet  2  . aspirin 325 MG tablet Take 325 mg by mouth 2 (two) times daily before a meal.        . atenolol (TENORMIN) 100 MG tablet TAKE 1 TABLET TWICE A DAY  200 tablet  2  . furosemide (LASIX) 40 MG tablet TAKE 60 MG DAILY      . glipiZIDE (GLUCOTROL) 10 MG tablet TAKE 1 TABLET (10 MG TOTAL) BY MOUTH 2 (TWO) TIMES DAILY BEFORE A MEAL.  200 tablet  3  . glucose blood (ONE TOUCH TEST STRIPS) test strip 1 each by Other route daily.  100 each  12  . HYDROcodone-homatropine (HYCODAN) 5-1.5 MG/5ML syrup Take 5 mLs by mouth every 8 (eight) hours as needed for cough.  240 mL  0  .  insulin lispro protamine-lispro (HUMALOG MIX 75/25 PEN) (75-25) 100 UNIT/ML SUSP Inject 25 Units into the skin daily with supper.  15 mL  12  . lisinopril (PRINIVIL,ZESTRIL) 40 MG tablet 40 mg daily.      . metFORMIN (GLUCOPHAGE) 1000 MG tablet TAKE 1 TABLET TWICE A DAY WITH A MEALS  200 tablet  2  . nitroGLYCERIN (NITROSTAT) 0.4 MG SL tablet Place 1 tablet (0.4 mg total) under the tongue every 5 (five) minutes as needed.  25 tablet  1  . omeprazole (PRILOSEC) 20 MG capsule TAKE ONE CAPSULE BY MOUTH EVERY DAY  90 capsule  3  . potassium chloride SA (K-DUR,KLOR-CON) 20 MEQ tablet Take 2 tabs twice daily  180 tablet  3  . simvastatin (ZOCOR) 20 MG tablet TAKE 1 TABLET (20 MG TOTAL) BY MOUTH AT BEDTIME.  90 tablet  3  . Multiple Vitamin (MULTIVITAMIN) tablet Take 1 tablet by mouth daily.         No current facility-administered medications for this visit.    Review of Systems : See HPI for  pertinent positives and negatives.  Physical Examination  Filed Vitals:   03/11/14 1245  BP: 145/65  Pulse: 57  Resp: 16   Filed Weights   03/11/14 1245  Weight: 269 lb (122.018 kg)   Body mass index is 39.17 kg/(m^2).  General: WDWN obese  male in NAD GAIT: normal Eyes: PERRLA Pulmonary:  Non-labored, CTAB, Negative  Rales, Negative rhonchi, or Negative wheezing.  Cardiac: regular Rhythm ,  Negative detected murmur.  VASCULAR EXAM Carotid Bruits Left Right   Negative Negative    Radial pulses are 2+ palpable and equal.                                                                                                                            LE Pulses LEFT RIGHT       POPLITEAL  not palpable   not palpable    Gastrointestinal: soft, nontender, BS WNL, no r/g,  negative masses.  Musculoskeletal: Negative muscle atrophy/wasting. M/S 5/5 throughout, Extremities without ischemic changes. 3+ bilateral pretibial pitting edema, no venous stasis changes.  Neurologic: A&O X 3; Appropriate Affect ; SENSATION ;normal;  Speech is normal CN 2-12 intact, Pain and light touch intact in extremities, Motor exam as listed above.   Non-Invasive Vascular Imaging CAROTID DUPLEX 03/11/2014   CEREBROVASCULAR DUPLEX EVALUATION    INDICATION: Follow-up carotid disease     PREVIOUS INTERVENTION(S): Left carotid endarterectomy 08/23/2011    DUPLEX EXAM:     RIGHT  LEFT  Peak Systolic Velocities (cm/s) End Diastolic Velocities (cm/s) Plaque LOCATION Peak Systolic Velocities (cm/s) End Diastolic Velocities (cm/s) Plaque  103 12  CCA PROXIMAL 124 17   90 16  CCA MID 85 17   101 17  CCA DISTAL 125 15   238 18  ECA 141 12   134 28 HT ICA PROXIMAL 108 15   81 15  ICA  MID 77 17   88 23  ICA DISTAL 82 22      ICA / CCA Ratio (PSV) NA  Antegrade  Vertebral Flow Antegrade   0000000 Brachial Systolic Pressure (mmHg) 0000000  Within normal limits  Brachial Artery Waveforms Within normal limits      Plaque Morphology:  HM = Homogeneous, HT = Heterogeneous, CP = Calcific Plaque, SP = Smooth Plaque, IP = Irregular Plaque     ADDITIONAL FINDINGS:     IMPRESSION: 1. Evidence of <40% stenosis of the right internal carotid artery. 2. Widely patent left carotid endarterectomy without evidence of restenosis or hyperplasia.  3. Bilateral vertebral artery is antegrade.    Compared to the previous exam:  No significant change compared to prior exam.       Assessment: [name] is a 73 y.o. male who is s/p left CEA on 08/23/2011 and presents with asymptomatic<40% stenosis of the right internal carotid artery. Widely patent left carotid endarterectomy without evidence of restenosis or hyperplasia.  Bilateral vertebral artery is antegrade.  The  ICA stenosis is  Unchanged from previous exam.  Plan: Follow-up in 1 year with Carotid Duplex scan.   I discussed in depth with the patient the nature of atherosclerosis, and emphasized the importance of maximal medical management including strict control of blood pressure, blood glucose, and lipid levels, obtaining regular exercise, and continued cessation of smoking.  The patient is aware that without maximal medical management the underlying atherosclerotic disease process will progress, limiting the benefit of any interventions. The patient was given information about stroke prevention and what symptoms should prompt the patient to seek immediate medical care. Thank you for allowing [place] to participate in this patient's care.  Clemon Chambers, RN, MSN, FNP-C [facility] [place] Office: [phone]  Clinic Physician: Bridgett Larsson  03/11/2014 12:53 PM

## 2014-03-11 NOTE — Patient Instructions (Signed)

## 2014-03-14 NOTE — Addendum Note (Signed)
Addended by: Mena Goes on: 03/14/2014 04:23 PM   Modules accepted: Orders

## 2014-04-06 ENCOUNTER — Other Ambulatory Visit: Payer: Self-pay | Admitting: Family Medicine

## 2014-06-30 ENCOUNTER — Other Ambulatory Visit: Payer: Self-pay | Admitting: Family Medicine

## 2014-08-08 ENCOUNTER — Other Ambulatory Visit: Payer: Self-pay | Admitting: Family Medicine

## 2014-09-13 ENCOUNTER — Encounter: Payer: Self-pay | Admitting: Family Medicine

## 2014-09-13 ENCOUNTER — Ambulatory Visit (INDEPENDENT_AMBULATORY_CARE_PROVIDER_SITE_OTHER): Payer: Medicare Other | Admitting: Family Medicine

## 2014-09-13 VITALS — BP 120/70 | Temp 98.4°F | Ht 70.0 in | Wt 261.0 lb

## 2014-09-13 DIAGNOSIS — E1049 Type 1 diabetes mellitus with other diabetic neurological complication: Secondary | ICD-10-CM

## 2014-09-13 DIAGNOSIS — N401 Enlarged prostate with lower urinary tract symptoms: Secondary | ICD-10-CM | POA: Diagnosis not present

## 2014-09-13 DIAGNOSIS — I6529 Occlusion and stenosis of unspecified carotid artery: Secondary | ICD-10-CM

## 2014-09-13 DIAGNOSIS — N138 Other obstructive and reflux uropathy: Secondary | ICD-10-CM

## 2014-09-13 DIAGNOSIS — R351 Nocturia: Secondary | ICD-10-CM

## 2014-09-13 DIAGNOSIS — E785 Hyperlipidemia, unspecified: Secondary | ICD-10-CM | POA: Diagnosis not present

## 2014-09-13 DIAGNOSIS — F528 Other sexual dysfunction not due to a substance or known physiological condition: Secondary | ICD-10-CM

## 2014-09-13 DIAGNOSIS — I10 Essential (primary) hypertension: Secondary | ICD-10-CM

## 2014-09-13 DIAGNOSIS — I2510 Atherosclerotic heart disease of native coronary artery without angina pectoris: Secondary | ICD-10-CM

## 2014-09-13 DIAGNOSIS — E109 Type 1 diabetes mellitus without complications: Secondary | ICD-10-CM | POA: Diagnosis not present

## 2014-09-13 DIAGNOSIS — Z23 Encounter for immunization: Secondary | ICD-10-CM

## 2014-09-13 DIAGNOSIS — E669 Obesity, unspecified: Secondary | ICD-10-CM

## 2014-09-13 DIAGNOSIS — R0989 Other specified symptoms and signs involving the circulatory and respiratory systems: Secondary | ICD-10-CM

## 2014-09-13 DIAGNOSIS — Z9889 Other specified postprocedural states: Secondary | ICD-10-CM

## 2014-09-13 LAB — BASIC METABOLIC PANEL
BUN: 19 mg/dL (ref 6–23)
CO2: 28 mEq/L (ref 19–32)
Calcium: 9 mg/dL (ref 8.4–10.5)
Chloride: 101 mEq/L (ref 96–112)
Creatinine, Ser: 0.9 mg/dL (ref 0.4–1.5)
GFR: 91.38 mL/min (ref >60.00)
Glucose, Bld: 111 mg/dL — ABNORMAL HIGH (ref 70–99)
Potassium: 4.2 mEq/L (ref 3.5–5.1)
Sodium: 139 mEq/L (ref 135–145)

## 2014-09-13 LAB — HEPATIC FUNCTION PANEL
ALT: 20 U/L (ref 0–53)
AST: 20 U/L (ref 0–37)
Albumin: 3.6 g/dL (ref 3.5–5.2)
Alkaline Phosphatase: 49 U/L (ref 39–117)
Bilirubin, Direct: 0.2 mg/dL (ref 0.0–0.3)
Total Bilirubin: 0.9 mg/dL (ref 0.2–1.2)
Total Protein: 6.6 g/dL (ref 6.0–8.3)

## 2014-09-13 LAB — POCT URINALYSIS DIPSTICK
Bilirubin, UA: NEGATIVE
Blood, UA: NEGATIVE
Glucose, UA: NEGATIVE
Ketones, UA: NEGATIVE
Leukocytes, UA: NEGATIVE
Nitrite, UA: NEGATIVE
Protein, UA: NEGATIVE
Spec Grav, UA: 1.01
Urobilinogen, UA: 0.2
pH, UA: 5.5

## 2014-09-13 LAB — LIPID PANEL
Cholesterol: 142 mg/dL (ref 0–200)
HDL: 42.8 mg/dL (ref >39.00)
LDL Cholesterol: 80 mg/dL (ref 0–99)
NonHDL: 99.2
Total CHOL/HDL Ratio: 3
Triglycerides: 97 mg/dL (ref 0.0–149.0)
VLDL: 19.4 mg/dL (ref 0.0–40.0)

## 2014-09-13 LAB — TSH: TSH: 1.35 u[IU]/mL (ref 0.35–4.50)

## 2014-09-13 LAB — PSA: PSA: 1.29 ng/mL (ref 0.10–4.00)

## 2014-09-13 LAB — HEMOGLOBIN A1C: Hgb A1c MFr Bld: 8.1 % — ABNORMAL HIGH (ref 4.6–6.5)

## 2014-09-13 MED ORDER — OMEPRAZOLE 20 MG PO CPDR: DELAYED_RELEASE_CAPSULE | ORAL | Status: DC

## 2014-09-13 MED ORDER — AMLODIPINE BESYLATE 10 MG PO TABS: ORAL_TABLET | ORAL | Status: DC

## 2014-09-13 MED ORDER — LISINOPRIL 40 MG PO TABS: 40.0000 mg | ORAL_TABLET | Freq: Every day | ORAL | Status: DC

## 2014-09-13 MED ORDER — INSULIN LISPRO PROT & LISPRO (75-25 MIX) 100 UNIT/ML KWIKPEN: PEN_INJECTOR | SUBCUTANEOUS | Status: DC

## 2014-09-13 MED ORDER — FUROSEMIDE 40 MG PO TABS: ORAL_TABLET | ORAL | Status: DC

## 2014-09-13 MED ORDER — GLIPIZIDE 10 MG PO TABS: ORAL_TABLET | ORAL | Status: DC

## 2014-09-13 MED ORDER — POTASSIUM CHLORIDE CRYS ER 20 MEQ PO TBCR: EXTENDED_RELEASE_TABLET | ORAL | Status: DC

## 2014-09-13 MED ORDER — SIMVASTATIN 20 MG PO TABS: ORAL_TABLET | ORAL | Status: DC

## 2014-09-13 MED ORDER — METFORMIN HCL 1000 MG PO TABS: ORAL_TABLET | ORAL | Status: DC

## 2014-09-13 MED ORDER — ATENOLOL 100 MG PO TABS: ORAL_TABLET | ORAL | Status: DC

## 2014-09-13 MED ORDER — GLUCOSE BLOOD VI STRP: 1.0000 | ORAL_STRIP | Freq: Every day | Status: DC

## 2014-09-13 MED ORDER — NITROGLYCERIN 0.4 MG SL SUBL: SUBLINGUAL_TABLET | SUBLINGUAL | Status: DC

## 2014-09-13 NOTE — Progress Notes (Signed)
   Subjective:    Patient ID: [name], male    DOB: 1941-11-08, 73 y.o.   MRN: [id number]  HPI [name] is a 73 year old married male nonsmoker who comes in today for evaluation of hypertension, diabetes type 1, coronary disease, hyperlipidemia, obesity  Weight is 261 pounds. He states he can't exercise because of pain in his left leg for the past 2 months. No history of trauma. Referred to orthopedics  He gets routine eye care, dental care, colonoscopy and GI, 6 vaccinations updated by Apolonio Schneiders is  Med list reviewed as correct  He checks a fasting blood sugar daily. Since he is on insulin 25 units along with glipizide 10 mg twice a day and metformin 1000 mg twice a day. Fasting sugars 90-110   Cognitive function normal he cannot exercise because of pain in his left leg, home health safety reviewed no issues identified, no guns in the house, stayed does have a health care power of attorney and living well   Review of Systems  Constitutional: Negative.   HENT: Negative.   Eyes: Negative.   Respiratory: Negative.   Cardiovascular: Negative.   Gastrointestinal: Negative.   Genitourinary: Negative.   Musculoskeletal: Negative.   Skin: Negative.   Neurological: Negative.   Psychiatric/Behavioral: Negative.        Objective:   Physical Exam  Nursing note and vitals reviewed. Constitutional: He is oriented to person, place, and time. He appears well-developed and well-nourished.  HENT:  Head: Normocephalic and atraumatic.  Right Ear: External ear normal.  Left Ear: External ear normal.  Nose: Nose normal.  Mouth/Throat: Oropharynx is clear and moist.  Eyes: Conjunctivae and EOM are normal. Pupils are equal, round, and reactive to light. Right eye exhibits no discharge. Left eye exhibits no discharge. No scleral icterus.  Neck: Normal range of motion. Neck supple. No JVD present. No tracheal deviation present. No thyromegaly present.  Cardiovascular: Normal rate, regular  rhythm, normal heart sounds and intact distal pulses.  Exam reveals no gallop and no friction rub.   No murmur heard. Scar left neck from previous carotid surgery right carotid no bruits left carotid no bruits  Peripheral pulses 1+ and symmetrical  Pulmonary/Chest: Effort normal and breath sounds normal. No stridor. No respiratory distress. He has no wheezes. He has no rales. He exhibits no tenderness.  Abdominal: Soft. Bowel sounds are normal. He exhibits no distension and no mass. There is no tenderness. There is no rebound and no guarding.  Genitourinary: Rectum normal and penis normal. Guaiac negative stool. No penile tenderness.  2+ symmetrical nonnodular BPH  Musculoskeletal: Normal range of motion. He exhibits no edema and no tenderness.  Lymphadenopathy:    He has no cervical adenopathy.  Neurological: He is alert and oriented to person, place, and time. He has normal reflexes. No cranial nerve deficit. He exhibits normal muscle tone.  Slight tingling of his feet for long term diabetes  Skin: Skin is warm and dry. No rash noted. No erythema. No pallor.  Psychiatric: He has a normal mood and affect. His behavior is normal. Judgment and thought content normal.          Assessment & Plan:  Obesity,,,,,,,,,,,,,, again encouraged diet exercise and weight loss  Pain left leg etiology unknown,,,,,,,, or so consult  Diabetes type 1,,,,,,,,, check labs  Hypertension at goal 120/70,,,,,, continue current therapy  Hyperlipidemia,,,,,,,, check labs  Coronary disease asymptomatic  Status post left carotid endarterectomy,,,,,,,,,, followup with Dr. Kellie Simmering yearly,

## 2014-09-13 NOTE — Progress Notes (Signed)
Pre visit review using our clinic review tool, if applicable. No additional management support is needed unless otherwise documented below in the visit note. Lab Results  Component Value Date   HGBA1C 7.4* 03/01/2013   HGBA1C 7.0* 09/01/2012   HGBA1C 7.6* 05/04/2012   Lab Results  Component Value Date   MICROALBUR 1.5 09/01/2012   LDLCALC 57 09/01/2012   CREATININE 0.9 03/01/2013

## 2014-09-13 NOTE — Patient Instructions (Signed)
Call with the peaks today to begin the evaluation of your left leg pain  Continue your other medications  As soon as you're able begin a 30 minute walking program daily  Followup in 3 months  Labs one week prior

## 2014-09-14 LAB — CBC WITH DIFFERENTIAL/PLATELET
Basophils Absolute: 0 10*3/uL (ref 0.0–0.1)
Basophils Relative: 0.3 % (ref 0.0–3.0)
Eosinophils Absolute: 0.2 10*3/uL (ref 0.0–0.7)
Eosinophils Relative: 2.1 % (ref 0.0–5.0)
HCT: 42.9 % (ref 39.0–52.0)
Hemoglobin: 14.2 g/dL (ref 13.0–17.0)
Lymphocytes Relative: 26.4 % (ref 12.0–46.0)
Lymphs Abs: 2.2 10*3/uL (ref 0.7–4.0)
MCHC: 33.1 g/dL (ref 30.0–36.0)
MCV: 89.5 fl (ref 78.0–100.0)
Monocytes Absolute: 0.6 10*3/uL (ref 0.1–1.0)
Monocytes Relative: 7.4 % (ref 3.0–12.0)
Neutro Abs: 5.4 10*3/uL (ref 1.4–7.7)
Neutrophils Relative %: 63.8 % (ref 43.0–77.0)
Platelets: 254 10*3/uL (ref 150.0–400.0)
RBC: 4.8 Mil/uL (ref 4.22–5.81)
RDW: 15.3 % (ref 11.5–15.5)
WBC: 8.5 10*3/uL (ref 4.0–10.5)

## 2014-09-14 LAB — MICROALBUMIN / CREATININE URINE RATIO
Creatinine,U: 61.8 mg/dL
Microalb Creat Ratio: 6.3 mg/g (ref 0.0–30.0)
Microalb, Ur: 3.9 mg/dL — ABNORMAL HIGH (ref 0.0–1.9)

## 2014-09-15 ENCOUNTER — Encounter: Payer: Self-pay | Admitting: Family Medicine

## 2014-09-24 DIAGNOSIS — M5430 Sciatica, unspecified side: Secondary | ICD-10-CM | POA: Diagnosis not present

## 2014-09-24 DIAGNOSIS — M6281 Muscle weakness (generalized): Secondary | ICD-10-CM | POA: Diagnosis not present

## 2014-09-24 DIAGNOSIS — M76899 Other specified enthesopathies of unspecified lower limb, excluding foot: Secondary | ICD-10-CM | POA: Diagnosis not present

## 2014-09-24 DIAGNOSIS — M5137 Other intervertebral disc degeneration, lumbosacral region: Secondary | ICD-10-CM | POA: Diagnosis not present

## 2014-10-05 DIAGNOSIS — M5442 Lumbago with sciatica, left side: Secondary | ICD-10-CM | POA: Diagnosis not present

## 2014-10-10 ENCOUNTER — Ambulatory Visit (INDEPENDENT_AMBULATORY_CARE_PROVIDER_SITE_OTHER): Payer: Medicare Other | Admitting: Family Medicine

## 2014-10-10 ENCOUNTER — Encounter: Payer: Self-pay | Admitting: Family Medicine

## 2014-10-10 VITALS — BP 124/72 | HR 72 | Temp 97.9°F | Ht 70.0 in | Wt 263.0 lb

## 2014-10-10 DIAGNOSIS — I6529 Occlusion and stenosis of unspecified carotid artery: Secondary | ICD-10-CM | POA: Diagnosis not present

## 2014-10-10 DIAGNOSIS — E109 Type 1 diabetes mellitus without complications: Secondary | ICD-10-CM | POA: Diagnosis not present

## 2014-10-10 DIAGNOSIS — E1069 Type 1 diabetes mellitus with other specified complication: Secondary | ICD-10-CM | POA: Diagnosis not present

## 2014-10-10 DIAGNOSIS — E119 Type 2 diabetes mellitus without complications: Secondary | ICD-10-CM | POA: Insufficient documentation

## 2014-10-10 DIAGNOSIS — IMO0002 Reserved for concepts with insufficient information to code with codable children: Secondary | ICD-10-CM

## 2014-10-10 DIAGNOSIS — E1065 Type 1 diabetes mellitus with hyperglycemia: Secondary | ICD-10-CM

## 2014-10-10 DIAGNOSIS — E108 Type 1 diabetes mellitus with unspecified complications: Secondary | ICD-10-CM

## 2014-10-10 MED ORDER — INSULIN LISPRO PROT & LISPRO (75-25 MIX) 100 UNIT/ML KWIKPEN: 30.0000 [IU] | PEN_INJECTOR | SUBCUTANEOUS | Status: DC

## 2014-10-10 NOTE — Patient Instructions (Signed)
Continue your current treatment program  Try walking or swimming daily  Return in 2 months for followup  Nonfasting labs,,,,,,,,, 11 AM or 4 PM,,, one week prior to office is

## 2014-10-10 NOTE — Progress Notes (Signed)
   Subjective:    Patient ID: [name], male    DOB: [birth year], 73 y.o.   MRN: [id number]  HPI [name] is a 73 year old male who comes in today for followup of diabetes  We saw him 3 weeks ago his blood sugar is elevated and his A1c was up to 8.0%. Therefore increased his insulin to 30 units daily. He comes back today his fasting sugars in the morning range from 70-110. No hypoglycemia. He went to see Dr. Durward Fortes about evaluation of his hip and back pain it turns out he has a bulging disc. They would not do an injection because he had taken 2 aspirin tablets. Recommend he take one aspirin daily in the future   Review of Systems    review of systems otherwise negative Objective:   Physical Exam  Well-developed well-nourished male no acute distress vital signs stable he is afebrile      Assessment & Plan:  Diabetes type 1 improve control.......... continue current dose of insulin and oral meds followup in 2 months

## 2014-10-10 NOTE — Progress Notes (Signed)
Pre visit review using our clinic review tool, if applicable. No additional management support is needed unless otherwise documented below in the visit note. 

## 2014-10-12 DIAGNOSIS — M5442 Lumbago with sciatica, left side: Secondary | ICD-10-CM | POA: Diagnosis not present

## 2014-10-14 DIAGNOSIS — M5442 Lumbago with sciatica, left side: Secondary | ICD-10-CM | POA: Diagnosis not present

## 2014-10-17 DIAGNOSIS — M5442 Lumbago with sciatica, left side: Secondary | ICD-10-CM | POA: Diagnosis not present

## 2014-10-20 DIAGNOSIS — M5442 Lumbago with sciatica, left side: Secondary | ICD-10-CM | POA: Diagnosis not present

## 2014-10-24 DIAGNOSIS — M5442 Lumbago with sciatica, left side: Secondary | ICD-10-CM | POA: Diagnosis not present

## 2014-10-27 DIAGNOSIS — M5442 Lumbago with sciatica, left side: Secondary | ICD-10-CM | POA: Diagnosis not present

## 2014-10-29 DIAGNOSIS — M7062 Trochanteric bursitis, left hip: Secondary | ICD-10-CM | POA: Diagnosis not present

## 2014-10-29 DIAGNOSIS — M5136 Other intervertebral disc degeneration, lumbar region: Secondary | ICD-10-CM | POA: Diagnosis not present

## 2014-10-29 DIAGNOSIS — M545 Low back pain: Secondary | ICD-10-CM | POA: Diagnosis not present

## 2014-10-29 DIAGNOSIS — M6281 Muscle weakness (generalized): Secondary | ICD-10-CM | POA: Diagnosis not present

## 2014-11-02 DIAGNOSIS — M5442 Lumbago with sciatica, left side: Secondary | ICD-10-CM | POA: Diagnosis not present

## 2014-11-04 DIAGNOSIS — M5442 Lumbago with sciatica, left side: Secondary | ICD-10-CM | POA: Diagnosis not present

## 2014-11-07 DIAGNOSIS — E119 Type 2 diabetes mellitus without complications: Secondary | ICD-10-CM | POA: Diagnosis not present

## 2014-11-09 DIAGNOSIS — M5442 Lumbago with sciatica, left side: Secondary | ICD-10-CM | POA: Diagnosis not present

## 2014-11-11 DIAGNOSIS — M5442 Lumbago with sciatica, left side: Secondary | ICD-10-CM | POA: Diagnosis not present

## 2014-11-14 DIAGNOSIS — M5442 Lumbago with sciatica, left side: Secondary | ICD-10-CM | POA: Diagnosis not present

## 2014-11-17 DIAGNOSIS — M5442 Lumbago with sciatica, left side: Secondary | ICD-10-CM | POA: Diagnosis not present

## 2014-11-21 DIAGNOSIS — M5442 Lumbago with sciatica, left side: Secondary | ICD-10-CM | POA: Diagnosis not present

## 2014-11-23 DIAGNOSIS — M5442 Lumbago with sciatica, left side: Secondary | ICD-10-CM | POA: Diagnosis not present

## 2014-12-05 ENCOUNTER — Other Ambulatory Visit (INDEPENDENT_AMBULATORY_CARE_PROVIDER_SITE_OTHER): Payer: Medicare Other

## 2014-12-05 ENCOUNTER — Other Ambulatory Visit: Payer: Medicare Other

## 2014-12-05 DIAGNOSIS — E109 Type 1 diabetes mellitus without complications: Secondary | ICD-10-CM

## 2014-12-05 DIAGNOSIS — E1069 Type 1 diabetes mellitus with other specified complication: Secondary | ICD-10-CM | POA: Diagnosis not present

## 2014-12-05 LAB — BASIC METABOLIC PANEL
BUN: 11 mg/dL (ref 6–23)
CO2: 28 mEq/L (ref 19–32)
Calcium: 8.9 mg/dL (ref 8.4–10.5)
Chloride: 102 mEq/L (ref 96–112)
Creatinine, Ser: 0.7 mg/dL (ref 0.4–1.5)
GFR: 110.07 mL/min (ref >60.00)
Glucose, Bld: 238 mg/dL — ABNORMAL HIGH (ref 70–99)
Potassium: 4.4 mEq/L (ref 3.5–5.1)
Sodium: 136 mEq/L (ref 135–145)

## 2014-12-05 LAB — HEMOGLOBIN A1C: Hgb A1c MFr Bld: 8 % — ABNORMAL HIGH (ref 4.6–6.5)

## 2014-12-12 ENCOUNTER — Ambulatory Visit (INDEPENDENT_AMBULATORY_CARE_PROVIDER_SITE_OTHER): Payer: Medicare Other | Admitting: Family Medicine

## 2014-12-12 ENCOUNTER — Encounter: Payer: Self-pay | Admitting: Family Medicine

## 2014-12-12 ENCOUNTER — Ambulatory Visit: Payer: Medicare Other | Admitting: Family Medicine

## 2014-12-12 VITALS — BP 130/78 | Temp 97.4°F | Wt 263.0 lb

## 2014-12-12 DIAGNOSIS — IMO0002 Reserved for concepts with insufficient information to code with codable children: Secondary | ICD-10-CM

## 2014-12-12 DIAGNOSIS — E1069 Type 1 diabetes mellitus with other specified complication: Secondary | ICD-10-CM

## 2014-12-12 DIAGNOSIS — I6529 Occlusion and stenosis of unspecified carotid artery: Secondary | ICD-10-CM

## 2014-12-12 DIAGNOSIS — E108 Type 1 diabetes mellitus with unspecified complications: Principal | ICD-10-CM

## 2014-12-12 DIAGNOSIS — E1065 Type 1 diabetes mellitus with hyperglycemia: Secondary | ICD-10-CM

## 2014-12-12 MED ORDER — INSULIN LISPRO PROT & LISPRO (75-25 MIX) 100 UNIT/ML KWIKPEN: 30.0000 [IU] | PEN_INJECTOR | SUBCUTANEOUS | Status: DC

## 2014-12-12 NOTE — Progress Notes (Signed)
   Subjective:    Patient ID: [name], male    DOB: Dec 12, 1941, 73 y.o.   MRN: [id number]  HPI [name] is a 73 year old married male nonsmoker who comes in today for evaluation of 2 issues  He's taking max doses of glipizide and metformin his insulin dose is 30 units daily. He states his metered home his reading around 100. However his meter is wrong his blood sugars were in the 2:30 range A1c is up to 8.0%. We gave him a new meter. We discussed various options. We will slowly increase his insulin until his blood sugar drops to normal.  One of the issues that is preventing his blood sugar from normalizing his he says he can't exercise. He's currently going to orthopedics for ruptured disc. He has no neurologic symptoms would indicate an emergent neurosurgical consult. However if in the future his symptoms persist or he develops a neurologic deficit would recommend he see a neurosurgeon for evaluation  On his antihypertensive medicine BP is 130/78.   Review of Systems Review of systems otherwise negative    Objective:   Physical Exam Well-developed well-nourished male no acute distress vital signs stable he is afebrile weight unchanged at 263 pounds, BP 130/78       Assessment & Plan:  Diabetes type 1 not at goal........ increase insulin to 40 units daily, FBS daily, if in 2 weeks blood sugar is not at goal 80-120 then increase it by 5 units every 2 weeks until his blood sugar does drop to goal  Hypertension ago continue current therapy  Lumbar disc disease currently being treated in orthopedics.........[name] recommended neurosurgical evaluation if he develops a neurologic deficit

## 2014-12-12 NOTE — Progress Notes (Signed)
Pre visit review using our clinic review tool, if applicable. No additional management support is needed unless otherwise documented below in the visit note. Lab Results  Component Value Date   HGBA1C 8.0* 12/05/2014   HGBA1C 8.1* 09/13/2014   HGBA1C 7.4* 03/01/2013   Lab Results  Component Value Date   MICROALBUR 3.9* 09/13/2014   LDLCALC 80 09/13/2014   CREATININE 0.7 12/05/2014

## 2014-12-12 NOTE — Patient Instructions (Signed)
Increase your insulin to 40 units daily  Fasting blood sugar daily in the morning  Fasting blood sugar goal......[name] 80-120  If in 2 weeks your blood sugar is not at goal increase your insulin to 45 units daily........ then every 2 weeks increase it by 5 units until your blood sugar drops to goal  Return in 3 months for follow-up  Nonfasting labs one week prior  Walk 15 minutes in morning and 15 minutes in the afternoon

## 2014-12-15 DIAGNOSIS — M5136 Other intervertebral disc degeneration, lumbar region: Secondary | ICD-10-CM | POA: Diagnosis not present

## 2014-12-15 DIAGNOSIS — M5442 Lumbago with sciatica, left side: Secondary | ICD-10-CM | POA: Diagnosis not present

## 2014-12-15 DIAGNOSIS — M6281 Muscle weakness (generalized): Secondary | ICD-10-CM | POA: Diagnosis not present

## 2015-03-16 ENCOUNTER — Encounter: Payer: Self-pay | Admitting: Family

## 2015-03-17 ENCOUNTER — Ambulatory Visit (INDEPENDENT_AMBULATORY_CARE_PROVIDER_SITE_OTHER): Payer: Medicare Other | Admitting: Family

## 2015-03-17 ENCOUNTER — Ambulatory Visit (HOSPITAL_COMMUNITY)
Admission: RE | Admit: 2015-03-17 | Discharge: 2015-03-17 | Disposition: A | Discharge status: Home / Self Care | Payer: Medicare Other | Source: Ambulatory Visit | Attending: Family | Admitting: Family

## 2015-03-17 ENCOUNTER — Encounter: Payer: Self-pay | Admitting: Family

## 2015-03-17 VITALS — BP 155/66 | HR 55 | Resp 16 | Ht 69.5 in | Wt 269.0 lb

## 2015-03-17 DIAGNOSIS — Z8673 Personal history of transient ischemic attack (TIA), and cerebral infarction without residual deficits: Secondary | ICD-10-CM

## 2015-03-17 DIAGNOSIS — I6523 Occlusion and stenosis of bilateral carotid arteries: Secondary | ICD-10-CM | POA: Diagnosis not present

## 2015-03-17 DIAGNOSIS — Z48812 Encounter for surgical aftercare following surgery on the circulatory system: Secondary | ICD-10-CM | POA: Insufficient documentation

## 2015-03-17 DIAGNOSIS — I872 Venous insufficiency (chronic) (peripheral): Secondary | ICD-10-CM | POA: Diagnosis not present

## 2015-03-17 NOTE — Progress Notes (Signed)
Established Carotid Patient   History of Present Illness  [name] is a 74 y.o. male patient of Dr. Scot [name] who is s/p left CEA on 08/23/2011. He returns today for follow up.  Patient has Positive history of stroke symptom, hemorrhagic stroke about 2000 as manifested by leaning to the left, resolved in a week, no further stroke or TIA events. The patient denies amaurosis fugax or monocular blindness. The patient denies facial drooping. Pt. denies hemiplegia. The patient denies receptive or expressive aphasia. Pt. denies extremity weakness. 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 He has dyspnea and is seeing Dr. Stanford Breed in 3 days.  Pt Diabetic: Yes, states under control Pt smoker: non-smoker  Pt meds include: Statin : Yes ASA: Yes Other anticoagulants/antiplatelets: no    Past Medical History  Diagnosis Date  . Diabetes mellitus   . GERD (gastroesophageal reflux disease)   . Hyperlipidemia   . Hypertension   . CVA (cerebral infarction) 2001, 2004  . CAD (coronary artery disease)     a. 06/2007 PCI LAD->Cypher DES;  b. 02/2010 Cath LM 30-40, LAD 40-1m, patent stent, D2 50, RI 70 ost, LCX 50, OM 30, RCA 30-73m, 30d, PDA 50, RPL 40-50p, nl EF.  . Carotid artery occlusion     a. 07/2011 L CEA;  b. 02/2012 Carotid U/S: RICA [id number], LICA patent.  . Peripheral vascular disease 2001  . History of blood transfusion   . Stroke     Social History History  Substance Use Topics  . Smoking status: Never Smoker   . Smokeless tobacco: Never Used  . Alcohol Use: No    Family History Family History  Problem Relation Age of Onset  . Heart disease Mother     CHF Heart Disease before age 45  . Heart disease Father     Heart Disease before age 61  . Cancer Brother     Lung  . Cancer Brother     Lung  . COPD Brother      Surgical History Past Surgical History  Procedure Laterality Date  . Angioplasty  2008, 2010    stent placement 2008  . Kidney stone removal  1969  . Inguinal hernia repair  1962    right  . Pr vein bypass graft,aorto-fem-pop  2008  . Carotid endarterectomy Left Aug. 24, 2012    cea  . Heart stent  06-30-07    Allergies  Allergen Reactions  . Clopidogrel Bisulfate Itching and Rash    REACTION: rash, itching from Head to Toe   -  PLAVIX   . Adhesive [Tape] Rash    rash    Current Outpatient Prescriptions  Medication Sig Dispense Refill  . amLODipine (NORVASC) 10 MG tablet TAKE 1 TABLET BY MOUTH ONCE DAILY 90 tablet 3  . aspirin 325 MG tablet Take 325 mg by mouth 2 (two) times daily before a meal.      . atenolol (TENORMIN) 100 MG tablet TAKE 1 TABLET TWICE A DAY 200 tablet 3  . furosemide (LASIX) 40 MG tablet TAKE 60 MG DAILY 100 tablet 3  . glipiZIDE (GLUCOTROL) 10 MG tablet TAKE 1 TABLET (10 MG TOTAL) BY MOUTH 2 (TWO) TIMES DAILY BEFORE A MEAL. 200 tablet 3  . glucose blood (ONE TOUCH TEST STRIPS) test strip 1 each by Other route daily. 100 each 12  . Insulin Lispro  Prot & Lispro (HUMALOG 75/25 MIX) (75-25) 100 UNIT/ML Kwikpen Inject 30 Units into the skin 1 day or 1 dose. INJECT 30 UNITS INTO THE SKIN DAILY WITH SUPPER. 2 pen 0  . lisinopril (PRINIVIL,ZESTRIL) 40 MG tablet Take 1 tablet (40 mg total) by mouth daily. 100 tablet 3  . metFORMIN (GLUCOPHAGE) 1000 MG tablet TAKE 1 TABLET BY MOUTH TWICE DAILY WITH MEALS 200 tablet 3  . nitroGLYCERIN (NITROSTAT) 0.4 MG SL tablet PLACE 1 TABLET (0.4 MG TOTAL) UNDER THE TONGUE EVERY 5 (FIVE) MINUTES AS NEEDED. 25 tablet 0  . omeprazole (PRILOSEC) 20 MG capsule TAKE ONE CAPSULE BY MOUTH EVERY DAY 90 capsule 3  . potassium chloride SA (K-DUR,KLOR-CON) 20 MEQ tablet Take 2 tabs twice daily 400 tablet 3  . simvastatin (ZOCOR) 20 MG tablet TAKE 1 TABLET (20 MG TOTAL) BY MOUTH AT BEDTIME. 90 tablet 3   No current facility-administered  medications for this visit.    Review of Systems : See HPI for pertinent positives and negatives.  Physical Examination  Filed Vitals:   03/17/15 1210 03/17/15 1213  BP: 174/75 155/66  Pulse: 57 55  Resp:  16  Height:  5' 9.5" (1.765 m)  Weight:  269 lb (122.018 kg)  SpO2:  98%   Body mass index is 39.17 kg/(m^2).  General: WDWN obese male in NAD GAIT: normal Eyes: PERRLA Pulmonary: Non-labored, CTAB, Negative Rales, Negative rhonchi, or Negative wheezing.  Cardiac: regular Rhythm, no detected murmur.  VASCULAR EXAM Carotid Bruits Left Right   Negative Negative   Radial pulses are 2+ palpable and equal.      LE Pulses LEFT RIGHT   POPLITEAL not palpable  not palpable    Gastrointestinal: soft, nontender, BS WNL, no r/g,no palpable masses.  Musculoskeletal: Negative muscle atrophy/wasting. M/S 5/5 throughout, Extremities without ischemic changes. 2+ bilateral pretibial pitting edema, no venous stasis changes.  Neurologic: A&O X 3; Appropriate Affect ; SENSATION ;normal;  Speech is normal CN 2-12 intact, Pain and light touch intact in extremities, Motor exam as listed above.         Non-Invasive Vascular Imaging CAROTID DUPLEX 03/17/2015   CEREBROVASCULAR DUPLEX EVALUATION    INDICATION: Carotid artery disease     PREVIOUS INTERVENTION(S): Left carotid endarterectomy 08/23/2011    DUPLEX EXAM:     RIGHT  LEFT  Peak Systolic Velocities (cm/s) End Diastolic Velocities (cm/s) Plaque LOCATION Peak Systolic Velocities (cm/s) End Diastolic Velocities (cm/s) Plaque  87 13  CCA PROXIMAL 106 11   87 12  CCA MID 93 11   107 14  CCA DISTAL 116 16   206 12  ECA 147 13   119 16 HT ICA PROXIMAL 151 16   128 20  ICA MID 141 14   84 17  ICA DISTAL 87 22     1.47 ICA / CCA Ratio (PSV)   Antegrade  Vertebral Flow  Antegrade   [id number] Brachial Systolic Pressure (mmHg) [id number]  Triphasic  Brachial Artery Waveforms Triphasic     Plaque Morphology:  HM = Homogeneous, HT = Heterogeneous, CP = Calcific Plaque, SP = Smooth Plaque, IP = Irregular Plaque     ADDITIONAL FINDINGS:     IMPRESSION: Bilateral internal carotid artery velocities suggest a <40% stenosis.     Compared to the previous exam:  No significant change in comparison to the last exam on 03/11/2014.      Assessment: QUADARIUS LIPUMA is a 74 y.o. male who is s/p left CEA on  08/23/2011. He had a hemorrhagic stroke in 2000 with resolution of symptoms in a week, no strokes or TIA's since then. Today's carotid Duplex reveals minimal bilateral ICA stenosis. No significant change in comparison to the last exam on 03/11/2014.  Venous insufficiency in both lower legs with 2+ pitting edema, edema resolves overnight, see Plan.  Face to face time with patient was 25 minutes. Over 50% of this time was spent on counseling and coordination of care.    Plan: Follow-up in 1 year with Carotid Duplex.  Graduated knee high compression hose, wear during the day.  I discussed in depth with the patient the nature of atherosclerosis, and emphasized the importance of maximal medical management including strict control of blood pressure, blood glucose, and lipid levels, obtaining regular exercise, and continued cessation of smoking.  The patient is aware that without maximal medical management the underlying atherosclerotic disease process will progress, limiting the benefit of any interventions. The patient was given information about stroke prevention and what symptoms should prompt the patient to seek immediate medical care. Thank you for allowing [place] to participate in this patient's care.  Clemon Chambers, RN, MSN, FNP-C [facility] [place] Office: [phone]  Clinic Physician: Early on call  03/17/2015 12:30 PM

## 2015-03-17 NOTE — Patient Instructions (Addendum)
Stroke Prevention Some medical conditions and behaviors are associated with an increased chance of having a stroke. You may prevent a stroke by making healthy choices and managing medical conditions. HOW CAN I REDUCE MY RISK OF HAVING A STROKE?   Stay physically active. Get at least 30 minutes of activity on most or all days.  Do not smoke. It may also be helpful to avoid exposure to secondhand smoke.  Limit alcohol use. Moderate alcohol use is considered to be:  No more than 2 drinks per day for men.  No more than 1 drink per day for nonpregnant women.  Eat healthy foods. This involves:  Eating 5 or more servings of fruits and vegetables a day.  Making dietary changes that address high blood pressure (hypertension), high cholesterol, diabetes, or obesity.  Manage your cholesterol levels.  Making food choices that are high in fiber and low in saturated fat, trans fat, and cholesterol may control cholesterol levels.  Take any prescribed medicines to control cholesterol as directed by your health care provider.  Manage your diabetes.  Controlling your carbohydrate and sugar intake is recommended to manage diabetes.  Take any prescribed medicines to control diabetes as directed by your health care provider.  Control your hypertension.  Making food choices that are low in salt (sodium), saturated fat, trans fat, and cholesterol is recommended to manage hypertension.  Take any prescribed medicines to control hypertension as directed by your health care provider.  Maintain a healthy weight.  Reducing calorie intake and making food choices that are low in sodium, saturated fat, trans fat, and cholesterol are recommended to manage weight.  Stop drug abuse.  Avoid taking birth control pills.  Talk to your health care provider about the risks of taking birth control pills if you are over 35 years old, smoke, get migraines, or have ever had a blood clot.  Get evaluated for sleep  disorders (sleep apnea).  Talk to your health care provider about getting a sleep evaluation if you snore a lot or have excessive sleepiness.  Take medicines only as directed by your health care provider.  For some people, aspirin or blood thinners (anticoagulants) are helpful in reducing the risk of forming abnormal blood clots that can lead to stroke. If you have the irregular heart rhythm of atrial fibrillation, you should be on a blood thinner unless there is a good reason you cannot take them.  Understand all your medicine instructions.  Make sure that other conditions (such as anemia or atherosclerosis) are addressed. SEEK IMMEDIATE MEDICAL CARE IF:   You have sudden weakness or numbness of the face, arm, or leg, especially on one side of the body.  Your face or eyelid droops to one side.  You have sudden confusion.  You have trouble speaking (aphasia) or understanding.  You have sudden trouble seeing in one or both eyes.  You have sudden trouble walking.  You have dizziness.  You have a loss of balance or coordination.  You have a sudden, severe headache with no known cause.  You have new chest pain or an irregular heartbeat. Any of these symptoms may represent a serious problem that is an emergency. Do not wait to see if the symptoms will go away. Get medical help at once. Call your local emergency services (911 in [place] Do not drive yourself to the hospital. Document Released: 01/23/2005 Document Revised: 05/02/2014 Document Reviewed: 06/18/2013 [employer] Patient Information 2015 [employer]. This information is not intended to replace advice given   to you by your health care provider. Make sure you discuss any questions you have with your health care provider.    Venous Stasis or Chronic Venous Insufficiency Chronic venous insufficiency, also called venous stasis, is a condition that affects the veins in the legs. The condition prevents blood from being pumped  through these veins effectively. Blood may no longer be pumped effectively from the legs back to the heart. This condition can range from mild to severe. With proper treatment, you should be able to continue with an active life. CAUSES  Chronic venous insufficiency occurs when the vein walls become stretched, weakened, or damaged or when valves within the vein are damaged. Some common causes of this include:  High blood pressure inside the veins (venous hypertension).  Increased blood pressure in the leg veins from long periods of sitting or standing.  A blood clot that blocks blood flow in a vein (deep vein thrombosis).  Inflammation of a superficial vein (phlebitis) that causes a blood clot to form. RISK FACTORS Various things can make you more likely to develop chronic venous insufficiency, including:  Family history of this condition.  Obesity.  Pregnancy.  Sedentary lifestyle.  Smoking.  Jobs requiring long periods of standing or sitting in one place.  Being a certain age. Women in their 76s and 74s and men in their 37s are more likely to develop this condition. SIGNS AND SYMPTOMS  Symptoms may include:   Varicose veins.  Skin breakdown or ulcers.  Reddened or discolored skin on the leg.  Brown, smooth, tight, and painful skin just above the ankle, usually on the inside surface (lipodermatosclerosis).  Swelling. DIAGNOSIS  To diagnose this condition, your health care provider will take a medical history and do a physical exam. The following tests may be ordered to confirm the diagnosis:  Duplex ultrasound--A procedure that produces a picture of a blood vessel and nearby organs and also provides information on blood flow through the blood vessel.  Plethysmography--A procedure that tests blood flow.  A venogram, or venography--A procedure used to look at the veins using X-ray and dye. TREATMENT The goals of treatment are to help you return to an active life and to  minimize pain or disability. Treatment will depend on the severity of the condition. Medical procedures may be needed for severe cases. Treatment options may include:   Use of compression stockings. These can help with symptoms and lower the chances of the problem getting worse, but they do not cure the problem.  Sclerotherapy--A procedure involving an injection of a material that "dissolves" the damaged veins. Other veins in the network of blood vessels take over the function of the damaged veins.  Surgery to remove the vein or cut off blood flow through the vein (vein stripping or laser ablation surgery).  Surgery to repair a valve. HOME CARE INSTRUCTIONS   Wear compression stockings as directed by your health care provider.  Only take over-the-counter or prescription medicines for pain, discomfort, or fever as directed by your health care provider.  Follow up with your health care provider as directed. SEEK MEDICAL CARE IF:   You have redness, swelling, or increasing pain in the affected area.  You see a red streak or line that extends up or down from the affected area.  You have a breakdown or loss of skin in the affected area, even if the breakdown is small.  You have an injury to the affected area. SEEK IMMEDIATE MEDICAL CARE IF:   You  have an injury and open wound in the affected area.  Your pain is severe and does not improve with medicine.  You have sudden numbness or weakness in the foot or ankle below the affected area, or you have trouble moving your foot or ankle.  You have a fever or persistent symptoms for more than 2-3 days.  You have a fever and your symptoms suddenly get worse. MAKE SURE YOU:   Understand these instructions.  Will watch your condition.  Will get help right away if you are not doing well or get worse. Document Released: 04/21/2007 Document Revised: 10/06/2013 Document Reviewed: 08/23/2013 [facility] Patient Information 2015 [place], [place].  This information is not intended to replace advice given to you by your health care provider. Make sure you discuss any questions you have with your health care provider.   Measure your lower legs; obtain graduated knee high compression hose, 20-30 mm mercury, wear during the day, remove at night.

## 2015-03-20 ENCOUNTER — Ambulatory Visit: Payer: Medicare Other | Admitting: Cardiology

## 2015-04-10 ENCOUNTER — Other Ambulatory Visit (INDEPENDENT_AMBULATORY_CARE_PROVIDER_SITE_OTHER): Payer: Medicare Other

## 2015-04-10 DIAGNOSIS — E1065 Type 1 diabetes mellitus with hyperglycemia: Secondary | ICD-10-CM

## 2015-04-10 DIAGNOSIS — IMO0002 Reserved for concepts with insufficient information to code with codable children: Secondary | ICD-10-CM

## 2015-04-10 DIAGNOSIS — E108 Type 1 diabetes mellitus with unspecified complications: Principal | ICD-10-CM

## 2015-04-10 DIAGNOSIS — E1069 Type 1 diabetes mellitus with other specified complication: Secondary | ICD-10-CM

## 2015-04-10 LAB — HEMOGLOBIN A1C: Hgb A1c MFr Bld: 7.7 % — ABNORMAL HIGH (ref 4.6–6.5)

## 2015-04-10 LAB — BASIC METABOLIC PANEL
BUN: 16 mg/dL (ref 6–23)
CO2: 31 mEq/L (ref 19–32)
Calcium: 9.5 mg/dL (ref 8.4–10.5)
Chloride: 100 mEq/L (ref 96–112)
Creatinine, Ser: 0.8 mg/dL (ref 0.40–1.50)
GFR: 100.5 mL/min (ref >60.00)
Glucose, Bld: 130 mg/dL — ABNORMAL HIGH (ref 70–99)
Potassium: 4.3 mEq/L (ref 3.5–5.1)
Sodium: 137 mEq/L (ref 135–145)

## 2015-04-17 ENCOUNTER — Encounter: Payer: Self-pay | Admitting: Family Medicine

## 2015-04-17 ENCOUNTER — Ambulatory Visit (INDEPENDENT_AMBULATORY_CARE_PROVIDER_SITE_OTHER): Payer: Medicare Other | Admitting: Family Medicine

## 2015-04-17 VITALS — BP 120/70 | Temp 98.0°F | Wt 268.0 lb

## 2015-04-17 DIAGNOSIS — E1069 Type 1 diabetes mellitus with other specified complication: Secondary | ICD-10-CM | POA: Diagnosis not present

## 2015-04-17 DIAGNOSIS — IMO0002 Reserved for concepts with insufficient information to code with codable children: Secondary | ICD-10-CM

## 2015-04-17 DIAGNOSIS — I6523 Occlusion and stenosis of bilateral carotid arteries: Secondary | ICD-10-CM | POA: Diagnosis not present

## 2015-04-17 DIAGNOSIS — E1065 Type 1 diabetes mellitus with hyperglycemia: Secondary | ICD-10-CM

## 2015-04-17 DIAGNOSIS — E108 Type 1 diabetes mellitus with unspecified complications: Principal | ICD-10-CM

## 2015-04-17 MED ORDER — INSULIN PEN NEEDLE 32G X 4 MM MISC: Status: DC

## 2015-04-17 NOTE — Patient Instructions (Signed)
Glucotrol 10 mg and metformin 1000 mg......... one of each before breakfast and one of each before your evening meal  Keep your insulin the same  Check a fasting blood sugar daily  Return in 3 months for an A1c............[name] Apolonio Schneiders we'll call you the report,,,,,,,,, her extension is [phone]

## 2015-04-17 NOTE — Progress Notes (Signed)
Pre visit review using our clinic review tool, if applicable. No additional management support is needed unless otherwise documented below in the visit note. 

## 2015-04-17 NOTE — Progress Notes (Signed)
   Subjective:    Patient ID: [name], male    DOB: 10-Oct-1941, 74 y.o.   MRN: [id number]  HPI [name] is a 74 year old married male nonsmoker who comes in today for evaluation of diabetes  He's on metformin 1000 mg twice a day and glipizide 10 mg twice a day. He's also taking insulin 30 units daily however his blood sugar fairly normal 1:30 but his A1c is 7.7%. He had one episode of hypoglycemia week or 2 ago when he was driving. Blood sugar dropped to 50. He thinks he took his medicine but didn't need any breakfast.  He's not walking on a regular basis because of his orthopedic problems. He has hip and knee pain.    Review of Systems Review of systems negative    Objective:   Physical Exam  Well-developed well-nourished overweight male no acute distress vital signs stable he is afebrile BP 120/70      Assessment & Plan:  Diabetes type 1 not at goal...Marland KitchenMarland KitchenMarland Kitchen take his oral meds before meals instead of after meals..... Continue current dose of insulin..... Check fasting blood sugar 3 times weekly........ follow-up A1c in 3 months.

## 2015-04-23 NOTE — Progress Notes (Signed)
HPI: FU CAD (previously followed by Dr Verl Blalock); s/p Cypher DES to the LAD 7/08. Echocardiogram 2/14: Mild LVH, EF [id number], grade 1 diastolic dysfunction, mild to moderate LAE, mild RAE. ETT-Myoview 02/04/13: Patient exercised for 4 minutes and complained of chest pain at peak exercise. ECG was felt to be abnormal with post exercise downsloping ST segments in the inferolateral leads. Images were normal with no evidence of ischemia, EF 59%. Also with chronic edema and cerebrovascular disease (followed by vascular surgery). Since last seen 2/14, he does have dyspnea on exertion and chronic pedal edema. No chest pain, palpitations or syncope. No orthopnea or PND.  Current Outpatient Prescriptions  Medication Sig Dispense Refill  . amLODipine (NORVASC) 10 MG tablet TAKE 1 TABLET BY MOUTH ONCE DAILY 90 tablet 3  . aspirin 325 MG tablet Take 325 mg by mouth daily.     [name] atenolol (TENORMIN) 100 MG tablet TAKE 1 TABLET TWICE A DAY 200 tablet 3  . furosemide (LASIX) 40 MG tablet TAKE 60 MG DAILY 100 tablet 3  . glipiZIDE (GLUCOTROL) 10 MG tablet TAKE 1 TABLET (10 MG TOTAL) BY MOUTH 2 (TWO) TIMES DAILY BEFORE A MEAL. 200 tablet 3  . glucose blood (ONE TOUCH TEST STRIPS) test strip 1 each by Other route daily. 100 each 12  . Insulin Lispro Prot & Lispro (HUMALOG 75/25 MIX) (75-25) 100 UNIT/ML Kwikpen Inject 30 Units into the skin 1 day or 1 dose. INJECT 30 UNITS INTO THE SKIN DAILY WITH SUPPER. 2 pen 0  . Insulin Pen Needle 32G X 4 MM MISC Use once daily. Dx E11.9 100 each 3  . lisinopril (PRINIVIL,ZESTRIL) 40 MG tablet Take 1 tablet (40 mg total) by mouth daily. 100 tablet 3  . metFORMIN (GLUCOPHAGE) 1000 MG tablet TAKE 1 TABLET BY MOUTH TWICE DAILY WITH MEALS 200 tablet 3  . nitroGLYCERIN (NITROSTAT) 0.4 MG SL tablet PLACE 1 TABLET (0.4 MG TOTAL) UNDER THE TONGUE EVERY 5 (FIVE) MINUTES AS NEEDED. 25 tablet 0  . omeprazole (PRILOSEC) 20 MG capsule TAKE ONE CAPSULE BY MOUTH EVERY DAY 90 capsule 3  .  potassium chloride SA (K-DUR,KLOR-CON) 20 MEQ tablet Take 2 tabs twice daily 400 tablet 3  . simvastatin (ZOCOR) 20 MG tablet TAKE 1 TABLET (20 MG TOTAL) BY MOUTH AT BEDTIME. 90 tablet 3   No current facility-administered medications for this visit.     Past Medical History  Diagnosis Date  . Diabetes mellitus   . GERD (gastroesophageal reflux disease)   . Hyperlipidemia   . Hypertension   . CVA (cerebral infarction) 2001, 2004  . CAD (coronary artery disease)     a. 06/2007 PCI LAD->Cypher DES;  b. 02/2010 Cath LM 30-40, LAD 40-58m, patent stent, D2 50, RI 70 ost, LCX 50, OM 30, RCA 30-81m, 30d, PDA 50, RPL 40-50p, nl EF.  . Carotid artery occlusion     a. 07/2011 L CEA;  b. 02/2012 Carotid U/S: RICA [id number], LICA patent.  . Peripheral vascular disease 2001  . History of blood transfusion   . Stroke     Past Surgical History  Procedure Laterality Date  . Angioplasty  2008, 2010    stent placement 2008  . Kidney stone removal  1969  . Inguinal hernia repair  1962    right  . Pr vein bypass graft,aorto-fem-pop  2008  . Carotid endarterectomy Left Aug. 24, 2012    cea  . Heart stent  06-30-07    History  Social History  . Marital Status: Married    Spouse Name: N/A  . Number of Children: N/A  . Years of Education: N/A   Occupational History  . Not on file.   Social History Main Topics  . Smoking status: Never Smoker   . Smokeless tobacco: Never Used  . Alcohol Use: No  . Drug Use: No  . Sexual Activity: Not on file   Other Topics Concern  . Not on file   Social History Narrative    ROS: no fevers or chills, productive cough, hemoptysis, dysphasia, odynophagia, melena, hematochezia, dysuria, hematuria, rash, seizure activity, orthopnea, PND, claudication. Remaining systems are negative.  Physical Exam: Well-developed obese in no acute distress.  Skin is warm and dry.  HEENT is normal.  Neck is supple.  Chest is clear to auscultation with normal expansion.    Cardiovascular exam is regular rate and rhythm.  Abdominal exam nontender or distended. No masses palpated. Extremities show 1+ edema. neuro grossly intact  ECG sinus rhythm with PACs. Left ventricular hypertrophy. Left axis deviation. Cannot rule out prior septal infarct. Nonspecific ST changes.

## 2015-04-26 ENCOUNTER — Encounter: Payer: Self-pay | Admitting: *Deleted

## 2015-04-26 ENCOUNTER — Encounter: Payer: Self-pay | Admitting: Cardiology

## 2015-04-26 ENCOUNTER — Ambulatory Visit (INDEPENDENT_AMBULATORY_CARE_PROVIDER_SITE_OTHER): Payer: Medicare Other | Admitting: Cardiology

## 2015-04-26 VITALS — BP 158/68 | HR 58 | Ht 69.5 in | Wt 270.3 lb

## 2015-04-26 DIAGNOSIS — I679 Cerebrovascular disease, unspecified: Secondary | ICD-10-CM | POA: Diagnosis not present

## 2015-04-26 DIAGNOSIS — R0989 Other specified symptoms and signs involving the circulatory and respiratory systems: Secondary | ICD-10-CM | POA: Diagnosis not present

## 2015-04-26 DIAGNOSIS — I6523 Occlusion and stenosis of bilateral carotid arteries: Secondary | ICD-10-CM | POA: Diagnosis not present

## 2015-04-26 DIAGNOSIS — I2510 Atherosclerotic heart disease of native coronary artery without angina pectoris: Secondary | ICD-10-CM | POA: Diagnosis not present

## 2015-04-26 DIAGNOSIS — G4730 Sleep apnea, unspecified: Secondary | ICD-10-CM

## 2015-04-26 DIAGNOSIS — R0600 Dyspnea, unspecified: Secondary | ICD-10-CM | POA: Diagnosis not present

## 2015-04-26 MED ORDER — ATORVASTATIN CALCIUM 40 MG PO TABS: 40.0000 mg | ORAL_TABLET | Freq: Every day | ORAL | Status: DC

## 2015-04-26 NOTE — Assessment & Plan Note (Signed)
Continue aspirin and statin. Followed by vascular surgery. 

## 2015-04-26 NOTE — Assessment & Plan Note (Signed)
Blood pressure is elevated. I discussed adding hydralazine but he would prefer not to add additional medications at this point. He would like to try weight loss and low-sodium diet. We will consider additional medications in the future.

## 2015-04-26 NOTE — Assessment & Plan Note (Signed)
Discontinue Zocor. Begin Lipitor 40 mg daily. Check lipids and liver in 4 weeks.

## 2015-04-26 NOTE — Assessment & Plan Note (Signed)
Scheduled abdominal ultrasound to exclude aneurysm. 

## 2015-04-26 NOTE — Assessment & Plan Note (Signed)
Discussed weight loss. 

## 2015-04-26 NOTE — Patient Instructions (Addendum)
Your physician wants you to follow-up in: [place] will receive a reminder letter in the mail two months in advance. If you don't receive a letter, please call our office to schedule the follow-up appointment.   Your physician has requested that you have an abdominal aorta duplex. During this test, an ultrasound is used to evaluate the aorta. Allow 30 minutes for this exam. Do not eat after midnight the day before and avoid carbonated beverages   STOP SIMVASTATIN  START ATORVASTATIN 40 MG ONCE DAILY  Your physician recommends that you return for lab work in: 4 WEEKS = DO NOT EAT PRIOR TO LAB WORK   REFERRAL TO PULMONARY FOR SLEEP APNEA

## 2015-04-26 NOTE — Assessment & Plan Note (Addendum)
Etiology unclear. Possible contribution from obesity hypoventilation syndrome. I discussed the possibility of a nuclear study to screen for ischemia. He declined at this point but we will consider in the future. Continue present dose of Lasix. Check BNP. If elevated we could increase diuretics in the future. Patient also snores. We will arrange evaluation by pulmonary for possible sleep apnea.

## 2015-04-26 NOTE — Assessment & Plan Note (Signed)
Continue aspirin and statin. 

## 2015-05-08 ENCOUNTER — Ambulatory Visit (HOSPITAL_COMMUNITY)
Admission: RE | Admit: 2015-05-08 | Discharge: 2015-05-08 | Disposition: A | Discharge status: Home / Self Care | Payer: Medicare Other | Source: Ambulatory Visit | Attending: Cardiology | Admitting: Cardiology

## 2015-05-08 DIAGNOSIS — I2510 Atherosclerotic heart disease of native coronary artery without angina pectoris: Secondary | ICD-10-CM | POA: Insufficient documentation

## 2015-05-08 DIAGNOSIS — E119 Type 2 diabetes mellitus without complications: Secondary | ICD-10-CM | POA: Insufficient documentation

## 2015-05-08 DIAGNOSIS — R0989 Other specified symptoms and signs involving the circulatory and respiratory systems: Secondary | ICD-10-CM

## 2015-05-08 DIAGNOSIS — I700 Atherosclerosis of aorta: Secondary | ICD-10-CM | POA: Insufficient documentation

## 2015-05-08 DIAGNOSIS — I10 Essential (primary) hypertension: Secondary | ICD-10-CM | POA: Diagnosis not present

## 2015-05-08 DIAGNOSIS — I739 Peripheral vascular disease, unspecified: Secondary | ICD-10-CM | POA: Diagnosis not present

## 2015-05-08 NOTE — Progress Notes (Signed)
Abdominal Aortic Completed. No evidence of AAA or stenosis noted. Oda Cogan, BS, RDMS, RVT

## 2015-06-09 ENCOUNTER — Other Ambulatory Visit: Payer: Self-pay | Admitting: Cardiology

## 2015-06-09 NOTE — Telephone Encounter (Signed)
Rx(s) sent to pharmacy electronically.  

## 2015-06-28 ENCOUNTER — Telehealth: Payer: Self-pay | Admitting: Pulmonary Disease

## 2015-06-28 ENCOUNTER — Ambulatory Visit (INDEPENDENT_AMBULATORY_CARE_PROVIDER_SITE_OTHER)
Admission: RE | Admit: 2015-06-28 | Discharge: 2015-06-28 | Disposition: A | Discharge status: Home / Self Care | Payer: Medicare Other | Source: Ambulatory Visit | Attending: Pulmonary Disease | Admitting: Pulmonary Disease

## 2015-06-28 ENCOUNTER — Encounter: Payer: Self-pay | Admitting: Pulmonary Disease

## 2015-06-28 ENCOUNTER — Ambulatory Visit (INDEPENDENT_AMBULATORY_CARE_PROVIDER_SITE_OTHER): Payer: Medicare Other | Admitting: Pulmonary Disease

## 2015-06-28 VITALS — BP 148/74 | HR 63 | Ht 70.0 in | Wt 274.0 lb

## 2015-06-28 DIAGNOSIS — R0600 Dyspnea, unspecified: Secondary | ICD-10-CM

## 2015-06-28 DIAGNOSIS — I6523 Occlusion and stenosis of bilateral carotid arteries: Secondary | ICD-10-CM | POA: Diagnosis not present

## 2015-06-28 DIAGNOSIS — G4733 Obstructive sleep apnea (adult) (pediatric): Secondary | ICD-10-CM

## 2015-06-28 DIAGNOSIS — R0602 Shortness of breath: Secondary | ICD-10-CM | POA: Diagnosis not present

## 2015-06-28 NOTE — Progress Notes (Deleted)
   Subjective:    Patient ID: [name], male    DOB: 1941-11-08, 74 y.o.   MRN: [id number]  HPI    Review of Systems  Constitutional: Negative for fever and unexpected weight change.  HENT: Positive for sneezing. Negative for congestion, dental problem, ear pain, nosebleeds, postnasal drip, rhinorrhea, sinus pressure, sore throat and trouble swallowing.   Eyes: Negative for redness and itching.  Respiratory: Positive for cough and shortness of breath. Negative for chest tightness and wheezing.   Cardiovascular: Negative for palpitations and leg swelling.  Gastrointestinal: Negative for nausea and vomiting.  Genitourinary: Negative for dysuria.  Musculoskeletal: Positive for joint swelling.  Skin: Negative for rash.  Neurological: Negative for headaches.  Hematological: Does not bruise/bleed easily.  Psychiatric/Behavioral: Negative for dysphoric mood. The patient is not nervous/anxious.        Objective:   Physical Exam        Assessment & Plan:

## 2015-06-28 NOTE — Telephone Encounter (Signed)
Dg Chest 2 View  06/28/2015   CLINICAL DATA:  Shortness of breath with exertion, history of diabetes and hypertension  EXAM: CHEST  2 VIEW  COMPARISON:  Chest x-ray of 08/23/2011  FINDINGS: The right basilar linear opacities are stable consistent with basilar scarring. No focal infiltrate or effusion is seen. Mediastinal and hilar contours are unremarkable. The heart is borderline enlarged. No acute bony abnormality seen with degenerative changes throughout the thoracic spine.  IMPRESSION: No active cardiopulmonary disease. Stable linear scarring at the right lung base.   Electronically Signed   By: Ivar Drape M.D.   On: 06/28/2015 14:50    Will have my nurse inform pt that his chest xray was okay.  No change to current tx plan.

## 2015-06-28 NOTE — Patient Instructions (Signed)
Chest xray today Will schedule pulmonary function test Will arrange for home sleep study Will call to arrange for follow up after sleep study reviewed

## 2015-06-28 NOTE — Progress Notes (Signed)
Chief Complaint  Patient presents with  . SLEEP CONSULT    Referred by Dr Stanford Breed. Epworth Score: 7    History of Present Illness: [name] is a 74 y.o. male for evaluation of sleep problems and dyspnea.  He has been followed by cardiology.  He has been concerned about his breathing, and there was concern about his breathing while asleep.  He will get winded with activity >> taking trash to end of his driveway.  He is not very active.  He has trouble with his back, and this has limited his activity.  He has to stabilize on shopping cart to walk at grocery store.    He has sinus congestion and cough during Spring time.  Otherwise he does not have much cough.  He denies wheeze, chest pain, or palpitations.  There is no history of asthma, emphysema, or thromboembolic disease.  He used to get frequent episodes of pneumonia, but not recently.  There is no hx of exposure to tuberculosis.  He never smoked cigarettes.  He has not had recent chest xray or PFT.  His wife says he snores and will sometimes stop breathing while asleep.  He can fall asleep easily if he is watching TV or reading.  He goes to sleep at 1 am.  He falls asleep easily.  He wakes up some times to use the bathroom.  He gets out of bed at 730 am.  He feels okay in the morning.  He denies morning headache.  He does not use anything to help him fall sleep or stay awake.  He denies sleep walking, sleep talking, bruxism, or nightmares.  There is no history of restless legs.  He denies sleep hallucinations, sleep paralysis, or cataplexy.  The Epworth score is 7 out of 24.  Tests: Echo 02/02/13 >> mild LVH, EF 55 to [id number], grade 1 diastolic dysfx, mild/mod LA dilation, mild RA dilation  [name]  has a past medical history of Diabetes mellitus; GERD (gastroesophageal reflux disease); Hyperlipidemia; Hypertension; CVA (cerebral infarction) (2001, 2004); CAD (coronary artery disease); Carotid artery occlusion;  Peripheral vascular disease (2001); History of blood transfusion; and Stroke.  [name]  has past surgical history that includes Angioplasty (2008, 2010); kidney stone removal (1969); Inguinal hernia repair (1962); vein bypass graft,aorto-fem-pop (2008); Carotid endarterectomy (Left, Aug. 24, 2012); and Heart stent (06-30-07).  Prior to Admission medications   Medication Sig Start Date End Date Taking? Authorizing Provider  amLODipine (NORVASC) 10 MG tablet TAKE 1 TABLET BY MOUTH ONCE DAILY 09/13/14  Yes Dorena Cookey, MD  aspirin 325 MG tablet Take 325 mg by mouth daily.    Yes Historical Provider, MD  atenolol (TENORMIN) 100 MG tablet TAKE 1 TABLET TWICE A DAY 09/13/14  Yes Dorena Cookey, MD  atorvastatin (LIPITOR) 40 MG tablet TAKE 1 TABLET (40 MG TOTAL) BY MOUTH DAILY. 06/09/15  Yes Lelon Perla, MD  furosemide (LASIX) 40 MG tablet TAKE 60 MG DAILY 09/13/14  Yes Dorena Cookey, MD  glipiZIDE (GLUCOTROL) 10 MG tablet TAKE 1 TABLET (10 MG TOTAL) BY MOUTH 2 (TWO) TIMES DAILY BEFORE A MEAL. 09/13/14  Yes Dorena Cookey, MD  glucose blood (ONE TOUCH TEST STRIPS) test strip 1 each by Other route daily. 09/13/14  Yes Dorena Cookey, MD  Insulin Lispro Prot & Lispro (HUMALOG 75/25 MIX) (75-25) 100 UNIT/ML Kwikpen Inject 30 Units into the skin 1 day or 1 dose. INJECT 30 UNITS INTO THE SKIN DAILY  WITH SUPPER. 12/12/14  Yes Dorena Cookey, MD  Insulin Pen Needle 32G X 4 MM MISC Use once daily. Dx E11.9 04/17/15  Yes Dorena Cookey, MD  lisinopril (PRINIVIL,ZESTRIL) 40 MG tablet Take 1 tablet (40 mg total) by mouth daily. 09/13/14  Yes Dorena Cookey, MD  metFORMIN (GLUCOPHAGE) 1000 MG tablet TAKE 1 TABLET BY MOUTH TWICE DAILY WITH MEALS 09/13/14  Yes Dorena Cookey, MD  nitroGLYCERIN (NITROSTAT) 0.4 MG SL tablet PLACE 1 TABLET (0.4 MG TOTAL) UNDER THE TONGUE EVERY 5 (FIVE) MINUTES AS NEEDED. 09/13/14  Yes Dorena Cookey, MD  omeprazole (PRILOSEC) 20 MG capsule TAKE ONE CAPSULE BY MOUTH EVERY DAY  09/13/14  Yes Dorena Cookey, MD  potassium chloride SA (K-DUR,KLOR-CON) 20 MEQ tablet Take 2 tabs twice daily 09/13/14  Yes Dorena Cookey, MD    Allergies  Allergen Reactions  . Clopidogrel Bisulfate Itching and Rash    REACTION: rash, itching from Head to Toe   -  PLAVIX   . Adhesive [Tape] Rash    rash    His family history includes COPD in his brother; Cancer in his brother and brother; Heart disease in his father and mother.  He  reports that he has never smoked. He has never used smokeless tobacco. He reports that he does not drink alcohol or use illicit drugs.  Review of Systems  Constitutional: Negative for fever and unexpected weight change.  HENT: Positive for sneezing. Negative for congestion, dental problem, ear pain, nosebleeds, postnasal drip, rhinorrhea, sinus pressure, sore throat and trouble swallowing.   Eyes: Negative for redness and itching.  Respiratory: Positive for cough and shortness of breath. Negative for chest tightness and wheezing.   Cardiovascular: Negative for palpitations and leg swelling.  Gastrointestinal: Negative for nausea and vomiting.  Genitourinary: Negative for dysuria.  Musculoskeletal: Positive for joint swelling.  Skin: Negative for rash.  Neurological: Negative for headaches.  Hematological: Does not bruise/bleed easily.  Psychiatric/Behavioral: Negative for dysphoric mood. The patient is not nervous/anxious.    Physical Exam: BP 148/74 mmHg  Pulse 63  Ht 5\' 10"  (1.778 m)  Wt 274 lb (124.286 kg)  BMI 39.32 kg/m2  SpO2 97%  General - No distress ENT - No sinus tenderness, no oral exudate, no LAN, no thyromegaly, TM clear, pupils equal/reactive, MP 3 Cardiac - s1s2 regular, no murmur, pulses symmetric Chest - No wheeze/rales/dullness, good air entry, normal respiratory excursion Back - No focal tenderness Abd - Soft, non-tender, no organomegaly, + bowel sounds Ext - 1+ ankle edema Neuro - Normal strength, cranial nerves  intact Skin - No rashes Psych - Normal mood, and behavior  Discussion: He has snoring, sleep disruption, witnessed apnea, and daytime sleepiness.  He has hx of CAD and HTN.  His BMI is > 35.  I am concerned he could have sleep apnea.  We discussed how sleep apnea can affect various health problems including risks for hypertension, cardiovascular disease, and diabetes.  We also discussed how sleep disruption can increase risks for accident, such as while driving.  Weight loss as a means of improving sleep apnea was also reviewed.  Additional treatment options discussed were CPAP therapy, oral appliance, and surgical intervention.  He complains of dyspnea on exertion.  This is most likely related to obesity and deconditioning.  Assessment/plan:  Obstructive sleep apnea. Plan: - will arrange for home sleep study pending insurance approval  Dyspnea. Plan: - will get chest xray and schedule PFT's - f/u with cardiology -  encouraged him to maintain regular exercise routine to improve with stamina  Obesity. Plan: - discussed importance of weight loss  Hypertension. Plan: - he is to f/u with cardiology and primary care   Chesley Mires, M.D. Pager [phone]

## 2015-06-29 ENCOUNTER — Other Ambulatory Visit: Payer: Self-pay | Admitting: Family Medicine

## 2015-06-29 DIAGNOSIS — E108 Type 1 diabetes mellitus with unspecified complications: Principal | ICD-10-CM

## 2015-06-29 DIAGNOSIS — IMO0002 Reserved for concepts with insufficient information to code with codable children: Secondary | ICD-10-CM

## 2015-06-29 DIAGNOSIS — E1065 Type 1 diabetes mellitus with hyperglycemia: Secondary | ICD-10-CM

## 2015-06-29 NOTE — Telephone Encounter (Signed)
I spoke with patient about results and he verbalized understanding and had no questions 

## 2015-07-13 DIAGNOSIS — G4730 Sleep apnea, unspecified: Secondary | ICD-10-CM | POA: Diagnosis not present

## 2015-07-13 DIAGNOSIS — G4733 Obstructive sleep apnea (adult) (pediatric): Secondary | ICD-10-CM | POA: Diagnosis not present

## 2015-07-17 ENCOUNTER — Other Ambulatory Visit (INDEPENDENT_AMBULATORY_CARE_PROVIDER_SITE_OTHER): Payer: Medicare Other

## 2015-07-17 DIAGNOSIS — E1065 Type 1 diabetes mellitus with hyperglycemia: Secondary | ICD-10-CM

## 2015-07-17 DIAGNOSIS — E1069 Type 1 diabetes mellitus with other specified complication: Secondary | ICD-10-CM

## 2015-07-17 DIAGNOSIS — E108 Type 1 diabetes mellitus with unspecified complications: Principal | ICD-10-CM

## 2015-07-17 DIAGNOSIS — IMO0002 Reserved for concepts with insufficient information to code with codable children: Secondary | ICD-10-CM

## 2015-07-17 LAB — HEMOGLOBIN A1C: Hgb A1c MFr Bld: 7.2 % — ABNORMAL HIGH (ref 4.6–6.5)

## 2015-07-17 LAB — BASIC METABOLIC PANEL
BUN: 13 mg/dL (ref 6–23)
CO2: 29 mEq/L (ref 19–32)
Calcium: 9.2 mg/dL (ref 8.4–10.5)
Chloride: 103 mEq/L (ref 96–112)
Creatinine, Ser: 0.79 mg/dL (ref 0.40–1.50)
GFR: 101.9 mL/min (ref >60.00)
Glucose, Bld: 103 mg/dL — ABNORMAL HIGH (ref 70–99)
Potassium: 4.8 mEq/L (ref 3.5–5.1)
Sodium: 141 mEq/L (ref 135–145)

## 2015-07-19 DIAGNOSIS — G4733 Obstructive sleep apnea (adult) (pediatric): Secondary | ICD-10-CM | POA: Diagnosis not present

## 2015-07-20 ENCOUNTER — Encounter: Payer: Self-pay | Admitting: Pulmonary Disease

## 2015-07-20 ENCOUNTER — Telehealth: Payer: Self-pay | Admitting: Pulmonary Disease

## 2015-07-20 ENCOUNTER — Ambulatory Visit (INDEPENDENT_AMBULATORY_CARE_PROVIDER_SITE_OTHER): Payer: Medicare Other | Admitting: Pulmonary Disease

## 2015-07-20 DIAGNOSIS — G4733 Obstructive sleep apnea (adult) (pediatric): Secondary | ICD-10-CM

## 2015-07-20 DIAGNOSIS — R0600 Dyspnea, unspecified: Secondary | ICD-10-CM

## 2015-07-20 DIAGNOSIS — Z9989 Dependence on other enabling machines and devices: Secondary | ICD-10-CM

## 2015-07-20 HISTORY — DX: Obstructive sleep apnea (adult) (pediatric): G47.33

## 2015-07-20 HISTORY — DX: Dependence on other enabling machines and devices: Z99.89

## 2015-07-20 LAB — PULMONARY FUNCTION TEST
DL/VA % pred: 103 %
DL/VA: 4.62 ml/min/mmHg/L
DLCO unc % pred: 81 %
DLCO unc: 24.24 ml/min/mmHg
FEF 25-75 Post: 3 L/sec
FEF 25-75 Pre: 2.3 L/sec
FEF2575-%Change-Post: 30 %
FEF2575-%Pred-Post: 143 %
FEF2575-%Pred-Pre: 109 %
FEV1-%Change-Post: 3 %
FEV1-%Pred-Post: 89 %
FEV1-%Pred-Pre: 86 %
FEV1-Post: 2.54 L
FEV1-Pre: 2.46 L
FEV1FVC-%Change-Post: 1 %
FEV1FVC-%Pred-Pre: 110 %
FEV6-%Change-Post: 2 %
FEV6-%Pred-Post: 83 %
FEV6-%Pred-Pre: 81 %
FEV6-Post: 3.08 L
FEV6-Pre: 3.02 L
FEV6FVC-%Change-Post: 0 %
FEV6FVC-%Pred-Post: 105 %
FEV6FVC-%Pred-Pre: 105 %
FVC-%Change-Post: 2 %
FVC-%Pred-Post: 78 %
FVC-%Pred-Pre: 76 %
FVC-Post: 3.1 L
FVC-Pre: 3.03 L
Post FEV1/FVC ratio: 82 %
Post FEV6/FVC ratio: 100 %
Pre FEV1/FVC ratio: 81 %
Pre FEV6/FVC Ratio: 100 %
RV % pred: 68 %
RV: 1.66 L
TLC % pred: 80 %
TLC: 5.32 L

## 2015-07-20 NOTE — Telephone Encounter (Signed)
PFT 07/20/15 >> FEV1 2.54 (89%), FEV1% 82, TLC 5.32 (80%), DLCO 81%, no BD  HST 07/13/15 >> AHI 33.5, SaO2 low 67%  Will have my nurse inform pt that PFT was normal.  Will have my nurse inform pt that sleep study shows severe sleep apnea.  Options are 1) CPAP now, 2) ROV first.  If He is agreeable to CPAP, then please send order for auto CPAP range 5 to 15 cm H2O with heated humidity and mask of choice.  Have download sent 1 month after starting CPAP and set up ROV 2 months after starting CPAP.

## 2015-07-20 NOTE — Progress Notes (Signed)
PFT done today. 

## 2015-07-24 ENCOUNTER — Other Ambulatory Visit: Payer: Self-pay | Admitting: *Deleted

## 2015-07-24 DIAGNOSIS — G4733 Obstructive sleep apnea (adult) (pediatric): Secondary | ICD-10-CM

## 2015-07-24 NOTE — Telephone Encounter (Signed)
Results have been explained to patient, pt expressed understanding.  Order for CPAP placed to DME: [facility] 2 month recall entered.  Nothing further needed.

## 2015-07-28 ENCOUNTER — Other Ambulatory Visit: Payer: Self-pay | Admitting: Pulmonary Disease

## 2015-07-28 DIAGNOSIS — G4733 Obstructive sleep apnea (adult) (pediatric): Secondary | ICD-10-CM

## 2015-08-15 ENCOUNTER — Other Ambulatory Visit: Payer: Self-pay | Admitting: Family Medicine

## 2015-09-08 ENCOUNTER — Encounter (HOSPITAL_COMMUNITY): Payer: Self-pay | Admitting: Emergency Medicine

## 2015-09-08 ENCOUNTER — Emergency Department (HOSPITAL_COMMUNITY): Payer: Medicare Other

## 2015-09-08 ENCOUNTER — Inpatient Hospital Stay (HOSPITAL_COMMUNITY)
Admission: EM | Admit: 2015-09-08 | Discharge: 2015-09-12 | DRG: 871 | Disposition: A | Discharge status: Home / Self Care | Payer: Medicare Other | Attending: Internal Medicine | Admitting: Internal Medicine

## 2015-09-08 DIAGNOSIS — G4733 Obstructive sleep apnea (adult) (pediatric): Secondary | ICD-10-CM | POA: Diagnosis not present

## 2015-09-08 DIAGNOSIS — Z8249 Family history of ischemic heart disease and other diseases of the circulatory system: Secondary | ICD-10-CM | POA: Diagnosis not present

## 2015-09-08 DIAGNOSIS — Z794 Long term (current) use of insulin: Secondary | ICD-10-CM

## 2015-09-08 DIAGNOSIS — Z8673 Personal history of transient ischemic attack (TIA), and cerebral infarction without residual deficits: Secondary | ICD-10-CM

## 2015-09-08 DIAGNOSIS — Z955 Presence of coronary angioplasty implant and graft: Secondary | ICD-10-CM | POA: Diagnosis not present

## 2015-09-08 DIAGNOSIS — E1051 Type 1 diabetes mellitus with diabetic peripheral angiopathy without gangrene: Secondary | ICD-10-CM | POA: Diagnosis present

## 2015-09-08 DIAGNOSIS — J449 Chronic obstructive pulmonary disease, unspecified: Secondary | ICD-10-CM | POA: Diagnosis present

## 2015-09-08 DIAGNOSIS — E785 Hyperlipidemia, unspecified: Secondary | ICD-10-CM | POA: Diagnosis present

## 2015-09-08 DIAGNOSIS — R079 Chest pain, unspecified: Secondary | ICD-10-CM | POA: Diagnosis not present

## 2015-09-08 DIAGNOSIS — Z809 Family history of malignant neoplasm, unspecified: Secondary | ICD-10-CM

## 2015-09-08 DIAGNOSIS — E1065 Type 1 diabetes mellitus with hyperglycemia: Secondary | ICD-10-CM | POA: Diagnosis present

## 2015-09-08 DIAGNOSIS — D72829 Elevated white blood cell count, unspecified: Secondary | ICD-10-CM

## 2015-09-08 DIAGNOSIS — J9601 Acute respiratory failure with hypoxia: Secondary | ICD-10-CM | POA: Diagnosis present

## 2015-09-08 DIAGNOSIS — R05 Cough: Secondary | ICD-10-CM | POA: Diagnosis not present

## 2015-09-08 DIAGNOSIS — Z9861 Coronary angioplasty status: Secondary | ICD-10-CM

## 2015-09-08 DIAGNOSIS — R509 Fever, unspecified: Secondary | ICD-10-CM

## 2015-09-08 DIAGNOSIS — K219 Gastro-esophageal reflux disease without esophagitis: Secondary | ICD-10-CM | POA: Diagnosis present

## 2015-09-08 DIAGNOSIS — J189 Pneumonia, unspecified organism: Secondary | ICD-10-CM | POA: Diagnosis not present

## 2015-09-08 DIAGNOSIS — I10 Essential (primary) hypertension: Secondary | ICD-10-CM | POA: Diagnosis present

## 2015-09-08 DIAGNOSIS — I9589 Other hypotension: Secondary | ICD-10-CM | POA: Diagnosis not present

## 2015-09-08 DIAGNOSIS — I2510 Atherosclerotic heart disease of native coronary artery without angina pectoris: Secondary | ICD-10-CM | POA: Diagnosis present

## 2015-09-08 DIAGNOSIS — R0602 Shortness of breath: Secondary | ICD-10-CM | POA: Diagnosis not present

## 2015-09-08 DIAGNOSIS — I5033 Acute on chronic diastolic (congestive) heart failure: Secondary | ICD-10-CM | POA: Diagnosis present

## 2015-09-08 DIAGNOSIS — B955 Unspecified streptococcus as the cause of diseases classified elsewhere: Secondary | ICD-10-CM | POA: Diagnosis present

## 2015-09-08 DIAGNOSIS — A403 Sepsis due to Streptococcus pneumoniae: Secondary | ICD-10-CM | POA: Diagnosis not present

## 2015-09-08 DIAGNOSIS — I509 Heart failure, unspecified: Secondary | ICD-10-CM | POA: Diagnosis not present

## 2015-09-08 DIAGNOSIS — A419 Sepsis, unspecified organism: Secondary | ICD-10-CM | POA: Diagnosis not present

## 2015-09-08 DIAGNOSIS — Z825 Family history of asthma and other chronic lower respiratory diseases: Secondary | ICD-10-CM

## 2015-09-08 DIAGNOSIS — I959 Hypotension, unspecified: Secondary | ICD-10-CM | POA: Diagnosis present

## 2015-09-08 DIAGNOSIS — E669 Obesity, unspecified: Secondary | ICD-10-CM | POA: Diagnosis present

## 2015-09-08 DIAGNOSIS — E119 Type 2 diabetes mellitus without complications: Secondary | ICD-10-CM | POA: Diagnosis present

## 2015-09-08 LAB — COMPREHENSIVE METABOLIC PANEL
ALT: 17 U/L (ref 17–63)
AST: 23 U/L (ref 15–41)
Albumin: 3.5 g/dL (ref 3.5–5.0)
Alkaline Phosphatase: 52 U/L (ref 38–126)
Anion gap: 8 (ref 5–15)
BUN: 15 mg/dL (ref 6–20)
CO2: 25 mmol/L (ref 22–32)
Calcium: 9.4 mg/dL (ref 8.9–10.3)
Chloride: 105 mmol/L (ref 101–111)
Creatinine, Ser: 1.01 mg/dL (ref 0.61–1.24)
GFR calc Af Amer: >60 mL/min (ref >60)
GFR calc non Af Amer: >60 mL/min (ref >60)
Glucose, Bld: 143 mg/dL — ABNORMAL HIGH (ref 65–99)
Potassium: 4 mmol/L (ref 3.5–5.1)
Sodium: 138 mmol/L (ref 135–145)
Total Bilirubin: 1.3 mg/dL — ABNORMAL HIGH (ref 0.3–1.2)
Total Protein: 6.7 g/dL (ref 6.5–8.1)

## 2015-09-08 LAB — URINALYSIS, ROUTINE W REFLEX MICROSCOPIC
Bilirubin Urine: NEGATIVE
Glucose, UA: NEGATIVE mg/dL
Hgb urine dipstick: NEGATIVE
Ketones, ur: NEGATIVE mg/dL
Leukocytes, UA: NEGATIVE
Nitrite: NEGATIVE
Protein, ur: 30 mg/dL — AB
Specific Gravity, Urine: 1.016 (ref 1.005–1.030)
Urobilinogen, UA: 1 mg/dL (ref 0.0–1.0)
pH: 5.5 (ref 5.0–8.0)

## 2015-09-08 LAB — URINE MICROSCOPIC-ADD ON

## 2015-09-08 LAB — CBC WITH DIFFERENTIAL/PLATELET
Basophils Absolute: 0 10*3/uL (ref 0.0–0.1)
Basophils Relative: 0 % (ref 0–1)
Eosinophils Absolute: 0 10*3/uL (ref 0.0–0.7)
Eosinophils Relative: 0 % (ref 0–5)
HCT: 38.7 % — ABNORMAL LOW (ref 39.0–52.0)
Hemoglobin: 13 g/dL (ref 13.0–17.0)
Lymphocytes Relative: 4 % — ABNORMAL LOW (ref 12–46)
Lymphs Abs: 0.6 10*3/uL — ABNORMAL LOW (ref 0.7–4.0)
MCH: 29.7 pg (ref 26.0–34.0)
MCHC: 33.6 g/dL (ref 30.0–36.0)
MCV: 88.4 fL (ref 78.0–100.0)
Monocytes Absolute: 0.6 10*3/uL (ref 0.1–1.0)
Monocytes Relative: 4 % (ref 3–12)
Neutro Abs: 15 10*3/uL — ABNORMAL HIGH (ref 1.7–7.7)
Neutrophils Relative %: 92 % — ABNORMAL HIGH (ref 43–77)
Platelets: 207 10*3/uL (ref 150–400)
RBC: 4.38 MIL/uL (ref 4.22–5.81)
RDW: 14.4 % (ref 11.5–15.5)
WBC: 16.2 10*3/uL — ABNORMAL HIGH (ref 4.0–10.5)

## 2015-09-08 LAB — I-STAT CG4 LACTIC ACID, ED: Lactic Acid, Venous: 2.59 mmol/L (ref 0.5–2.0)

## 2015-09-08 MED ORDER — ACETAMINOPHEN 325 MG PO TABS
650.0000 mg | ORAL_TABLET | Freq: Once | ORAL | Status: AC | PRN
  Administered 2015-09-08: 650 mg via ORAL
  Filled 2015-09-08: qty 2

## 2015-09-08 MED ORDER — SODIUM CHLORIDE 0.9 % IV BOLUS (SEPSIS)
1000.0000 mL | INTRAVENOUS | Status: AC
  Administered 2015-09-08 – 2015-09-09 (×4): 1000 mL via INTRAVENOUS

## 2015-09-08 MED ORDER — SODIUM CHLORIDE 0.9 % IV SOLN
1000.0000 mL | INTRAVENOUS | Status: DC
  Administered 2015-09-08: 1000 mL via INTRAVENOUS

## 2015-09-08 MED ORDER — DEXTROSE 5 % IV SOLN
500.0000 mg | INTRAVENOUS | Status: DC
  Administered 2015-09-09 – 2015-09-10 (×2): 500 mg via INTRAVENOUS
  Filled 2015-09-08 (×3): qty 500

## 2015-09-08 MED ORDER — CEFTRIAXONE SODIUM 1 G IJ SOLR
1.0000 g | INTRAMUSCULAR | Status: DC
  Administered 2015-09-09 – 2015-09-10 (×2): 1 g via INTRAVENOUS
  Filled 2015-09-08 (×3): qty 10

## 2015-09-08 MED ORDER — DEXTROSE 5 % IV SOLN
1.0000 g | Freq: Once | INTRAVENOUS | Status: AC
  Administered 2015-09-08: 1 g via INTRAVENOUS
  Filled 2015-09-08: qty 10

## 2015-09-08 MED ORDER — DEXTROSE 5 % IV SOLN
500.0000 mg | Freq: Once | INTRAVENOUS | Status: AC
  Administered 2015-09-08: 500 mg via INTRAVENOUS
  Filled 2015-09-08: qty 500

## 2015-09-08 MED ORDER — SODIUM CHLORIDE 0.9 % IV BOLUS (SEPSIS)
1000.0000 mL | Freq: Once | INTRAVENOUS | Status: AC
  Administered 2015-09-08: 1000 mL via INTRAVENOUS

## 2015-09-08 NOTE — ED Provider Notes (Signed)
CSN: [id number]     Arrival date & time 09/08/15  2047 History   First MD Initiated Contact with Patient 09/08/15 2059     Chief Complaint  Patient presents with  . Code Sepsis   Patient is a 74 y.o. male presenting with general illness. The history is provided by the patient and the EMS personnel. No language interpreter was used.  Illness Location:  Generalized Quality:  Fever, chills, cough Severity:  Moderate Onset quality:  Gradual Timing:  Constant Progression:  Worsening Chronicity:  New Context:  PMHx of HLD, HTN, DM, CVA, PVD, CAD, HFpEF (55-65% in 2014, taking Lasix), OSA (CPAP initiated x1 month ago), and COPD (no albuterol or O2 at home) presenting with subjective fever, chills, and dry cough 2 days. Denies abdominal pain, N/V/D, hematochezia, melena, hematuria, dysuria, urinary frequency/urgency, or hemoptysis. Patient has been admitted to hospital multiple times for PNA (none this year). Denies Hx of UTI's. Lives at home. Denies recent antibiotics. Wife reported recent AMS. Associated symptoms: cough, fever and shortness of breath   Associated symptoms: no abdominal pain, no chest pain, no diarrhea, no loss of consciousness, no nausea, no vomiting and no wheezing     Past Medical History  Diagnosis Date  . Diabetes mellitus   . GERD (gastroesophageal reflux disease)   . Hyperlipidemia   . Hypertension   . CVA (cerebral infarction) 2001, 2004  . CAD (coronary artery disease)     a. 06/2007 PCI LAD->Cypher DES;  b. 02/2010 Cath LM 30-40, LAD 40-68m, patent stent, D2 50, RI 70 ost, LCX 50, OM 30, RCA 30-48m, 30d, PDA 50, RPL 40-50p, nl EF.  . Carotid artery occlusion     a. 07/2011 L CEA;  b. 02/2012 Carotid U/S: RICA [id number], LICA patent.  . Peripheral vascular disease 2001  . History of blood transfusion   . Stroke   . OSA (obstructive sleep apnea) 07/20/2015   Past Surgical History  Procedure Laterality Date  . Angioplasty  2008, 2010    stent placement 2008  . Kidney  stone removal  1969  . Inguinal hernia repair  1962    right  . Pr vein bypass graft,aorto-fem-pop  2008  . Carotid endarterectomy Left Aug. 24, 2012    cea  . Heart stent  06-30-07   Family History  Problem Relation Age of Onset  . Heart disease Mother     CHF Heart Disease before age 72  . Heart disease Father     Heart Disease before age 60  . Cancer Brother     Lung  . Cancer Brother     Lung  . COPD Brother    Social History  Substance Use Topics  . Smoking status: Never Smoker   . Smokeless tobacco: Never Used  . Alcohol Use: No    Review of Systems  Constitutional: Positive for fever and chills.  Respiratory: Positive for cough and shortness of breath. Negative for wheezing.   Cardiovascular: Positive for leg swelling (chronic). Negative for chest pain and palpitations.  Gastrointestinal: Negative for nausea, vomiting, abdominal pain and diarrhea.  Neurological: Negative for loss of consciousness.  All other systems reviewed and are negative.   Allergies  Clopidogrel bisulfate and Adhesive  Home Medications   Prior to Admission medications   Medication Sig Start Date End Date Taking? Authorizing Provider  amLODipine (NORVASC) 10 MG tablet TAKE 1 TABLET BY MOUTH ONCE DAILY 09/13/14  Yes Dorena Cookey, MD  aspirin 325 MG tablet Take  325 mg by mouth daily.    Yes Historical Provider, MD  atenolol (TENORMIN) 100 MG tablet TAKE 1 TABLET TWICE A DAY 09/13/14  Yes Dorena Cookey, MD  atorvastatin (LIPITOR) 40 MG tablet TAKE 1 TABLET (40 MG TOTAL) BY MOUTH DAILY. 06/09/15  Yes Lelon Perla, MD  furosemide (LASIX) 40 MG tablet TAKE 60 MG DAILY 09/13/14  Yes Dorena Cookey, MD  glipiZIDE (GLUCOTROL) 10 MG tablet TAKE 1 TABLET (10 MG TOTAL) BY MOUTH 2 (TWO) TIMES DAILY BEFORE A MEAL. 09/13/14  Yes Dorena Cookey, MD  Insulin Lispro Prot & Lispro (HUMALOG 75/25 MIX) (75-25) 100 UNIT/ML Kwikpen Inject 30 Units into the skin 1 day or 1 dose. INJECT 30 UNITS INTO THE SKIN DAILY  WITH SUPPER. Patient taking differently: Inject 25 Units into the skin at bedtime.  12/12/14  Yes Dorena Cookey, MD  lisinopril (PRINIVIL,ZESTRIL) 40 MG tablet Take 1 tablet (40 mg total) by mouth daily. 09/13/14  Yes Dorena Cookey, MD  metFORMIN (GLUCOPHAGE) 1000 MG tablet TAKE 1 TABLET BY MOUTH TWICE DAILY WITH MEALS 09/13/14  Yes Dorena Cookey, MD  nitroGLYCERIN (NITROSTAT) 0.4 MG SL tablet PLACE 1 TABLET (0.4 MG TOTAL) UNDER THE TONGUE EVERY 5 (FIVE) MINUTES AS NEEDED. 09/13/14  Yes Dorena Cookey, MD  omeprazole (PRILOSEC) 20 MG capsule TAKE ONE CAPSULE BY MOUTH EVERY DAY 09/13/14  Yes Dorena Cookey, MD  potassium chloride SA (K-DUR,KLOR-CON) 20 MEQ tablet Take 2 tabs twice daily 09/13/14  Yes Dorena Cookey, MD  furosemide (LASIX) 40 MG tablet TAKE 1 TABLET BY MOUTH IN THE AM, 1/2 TABLET IN THE EVENING AS DIRECTED *TIME FOR AN OFFICE VISIT* 08/15/15   Dorena Cookey, MD  glucose blood (ONE TOUCH TEST STRIPS) test strip 1 each by Other route daily. 09/13/14   Dorena Cookey, MD  Insulin Pen Needle 32G X 4 MM MISC Use once daily. Dx E11.9 04/17/15   Dorena Cookey, MD   BP 136/41 mmHg  Pulse 76  Temp(Src) 99.5 F (37.5 C) (Oral)  Resp 29  Ht 5' 9.5" (1.765 m)  Wt 265 lb (120.203 kg)  BMI 38.59 kg/m2  SpO2 94%   Physical Exam  Constitutional: He is oriented to person, place, and time. No distress.  Elderly obese male lying in stretcher in no distress  HENT:  Head: Normocephalic and atraumatic.  Eyes: Conjunctivae are normal. Pupils are equal, round, and reactive to light.  Neck: Normal range of motion. Neck supple.  Cardiovascular: Regular rhythm and intact distal pulses.   Heart rate 90s  Pulmonary/Chest: No respiratory distress. He has no wheezes. He has no rales.  Tachypneic to mid 20s, coarse breath sounds, oxygen saturation mid 90s on room air.  Abdominal: Soft. Bowel sounds are normal. He exhibits no distension. There is no tenderness. There is no rebound.  Musculoskeletal:  Normal range of motion.  Neurological: He is alert and oriented to person, place, and time.  Skin: Skin is warm and dry. He is not diaphoretic.  Nursing note and vitals reviewed.   ED Course  Procedures   Labs Review Labs Reviewed  COMPREHENSIVE METABOLIC PANEL - Abnormal; Notable for the following:    Glucose, Bld 143 (*)    Total Bilirubin 1.3 (*)    All other components within normal limits  URINALYSIS, ROUTINE W REFLEX MICROSCOPIC (NOT AT [facility]) - Abnormal; Notable for the following:    Protein, ur 30 (*)    All other components  within normal limits  CBC WITH DIFFERENTIAL/PLATELET - Abnormal; Notable for the following:    WBC 16.2 (*)    HCT 38.7 (*)    Neutrophils Relative % 92 (*)    Lymphocytes Relative 4 (*)    Neutro Abs 15.0 (*)    Lymphs Abs 0.6 (*)    All other components within normal limits  I-STAT CG4 LACTIC ACID, ED - Abnormal; Notable for the following:    Lactic Acid, Venous 2.59 (*)    All other components within normal limits  CULTURE, BLOOD (ROUTINE X 2)  CULTURE, BLOOD (ROUTINE X 2)  URINE CULTURE  URINE MICROSCOPIC-ADD ON  BRAIN NATRIURETIC PEPTIDE  I-STAT CG4 LACTIC ACID, ED   Imaging Review Dg Chest Portable 1 View  09/08/2015   CLINICAL DATA:  Initial evaluation for acute chest pain, cough.  EXAM: PORTABLE CHEST - 1 VIEW  COMPARISON:  Prior radiograph from 06/28/2015.  FINDINGS: Patient is rotated to the right. Allowing for rotation, cardiac and mediastinal silhouettes are stable in size and contour, and remain within normal limits.  Lungs are mildly hypoinflated. Linear opacity right lung base most compatible with scarring, stable. Mild diffuse prominence of the interstitial markings is not significantly changed. There is slightly more prominent patchy bibasilar opacities as compared to prior study, which may reflect vascular crowding or possibly developing infectious infiltrates. Mid and upper lungs are clear. No pulmonary edema. No definite pleural  effusion on this limited single view of the chest. No pneumothorax.  No acute osseus abnormality.  Surgical clips overlie the left neck.  IMPRESSION: 1. Mild bibasilar patchy opacities, which may reflect vascular crowding/atelectasis or possibly developing infectious infiltrates. 2. Stable linear scarring at the right lung base.   Electronically Signed   By: Jeannine Boga M.D.   On: 09/08/2015 22:08   I have personally reviewed and evaluated these images and lab results as part of my medical decision-making.   EKG Interpretation   Date/Time:  Friday September 08 2015 21:00:22 EDT Ventricular Rate:  91 PR Interval:  178 QRS Duration: 94 QT Interval:  340 QTC Calculation: 418 R Axis:   -33 Text Interpretation:  Sinus rhythm Left ventricular hypertrophy Anterior Q  waves, possibly due to LVH Since last tracing rate faster Confirmed by  KNAPP  MD-J, JON [id number]) on 09/08/2015 9:22:52 PM      MDM  Mr Plaugher is a 74 yo male w/ PMHx of HLD, HTN, DM, CVA, PVD, CAD, HFpEF (55-65% in 2014, taking Lasix), OSA (CPAP initiated x1 month ago), and COPD (no albuterol or O2 at home) presenting with subjective fever, chills, and dry cough 2 days. Denies abdominal pain, N/V/D, hematochezia, melena, hematuria, dysuria, urinary frequency/urgency, or hemoptysis. Patient has been admitted to hospital multiple times for PNA (none this year). Denies Hx of UTI's. Lives at home. Denies recent antibiotics. Wife reported recent AMS.  Exam above notable for obese elderly male lying in stretcher in no acute distress. Rectal temperature 102.22F. HR 90s. Normotensive. Tachypneic to mid 20s. Oxygen saturations 96% on room air. Coarse breath sounds. Abdomen benign. Patient alert and oriented 3 neuro exam nonfocal. 2+ bilateral lower extremity pitting edema to level of the shin.  Given temperature, tachypnea, and reported AMS - good sepsis called. Blood and urine cultures collected. WBC 16.2. Lactic acid 2.59. IV  fluids started. Tylenol given. Chest x-ray showing bibasilar patchy opacities. Patient given IV Rocephin and ceftriaxone to cover for CAP. UA negative for infection or blood. CMP relatively unremarkable aside  from T bili of 1.3.  Patient mid to hospitalist service for further evaluation and management of sepsis with presumed CAP origin. Patient stands and agrees with the plan and has no further questions or concerns time.  Care discussed with and followed by my attending, Dr. Dorie Rank.  Final diagnoses:  CAP (community acquired pneumonia)  Leukocytosis  Fever in adult  Sepsis, due to unspecified organism    Mayer Camel, MD 09/09/15 [id number]  Dorie Rank, MD 09/09/15 0030

## 2015-09-08 NOTE — ED Notes (Signed)
Pt arrives via EMS from home with chills, TEMP 105.3 and 102.9 with EMS. Starting sometime this afternoon, felt cold and went to car to turn heat on. Hot to the touch. Positive ortho 162/54, standing 144/68, HR 92. Hx DM, CBG 160. No IV access  .

## 2015-09-08 NOTE — Consult Note (Signed)
PHARMACY NOTE  CONSULT :  Ceftriaxone, Azithromycin INDICATION :  CAP, Code Sepsis   Recent Labs Lab 09/08/15 2102 09/08/15 2112  WBC 16.2*  --   LATICACIDVEN  --  2.59*   Filed Vitals:   09/08/15 2130 09/08/15 2145 09/08/15 2200 09/08/15 2211  BP:  141/47 139/40   Pulse: 90 88 87   Temp:    99.5 F  (37.5 C)  TempSrc:    Oral  Resp: 30 30 29    SpO2: 97% 95% 95%      ASSESSMENT:  Pharmacy consulted for Code Sepsis and CAP.  Azithromycin and Ceftriaxone ordered.   No renal adjustments required.   [name]  PLAN:  1. Ceftriaxone 1 gm IV q 24 hours. 2. Azithromycin 500 mg IV q 24 hours. 3. Recommend Monitoring WBC's, fever curve, any cultures/sensitivities, and clinical progression. 4. Pharmacy will Sign Off and follow peripherally given no adjustments in doses or schedules are anticipated.  There are alerts to indicate dramatic changes in renal function or clinical condition that might require dose or schedule adjustments.  Please re-consult if additional assistance is needed. Thank you for allowing Pharmacy to participate in this patient's care   Marthenia Rolling,  Pharm.D. ,  09/08/2015,  10:25 PM

## 2015-09-09 ENCOUNTER — Encounter (HOSPITAL_COMMUNITY): Payer: Self-pay | Admitting: General Practice

## 2015-09-09 DIAGNOSIS — D72829 Elevated white blood cell count, unspecified: Secondary | ICD-10-CM | POA: Diagnosis present

## 2015-09-09 DIAGNOSIS — IMO0001 Reserved for inherently not codable concepts without codable children: Secondary | ICD-10-CM | POA: Insufficient documentation

## 2015-09-09 DIAGNOSIS — I9589 Other hypotension: Secondary | ICD-10-CM

## 2015-09-09 DIAGNOSIS — I959 Hypotension, unspecified: Secondary | ICD-10-CM | POA: Diagnosis present

## 2015-09-09 DIAGNOSIS — I2510 Atherosclerotic heart disease of native coronary artery without angina pectoris: Secondary | ICD-10-CM

## 2015-09-09 DIAGNOSIS — A419 Sepsis, unspecified organism: Principal | ICD-10-CM

## 2015-09-09 DIAGNOSIS — E119 Type 2 diabetes mellitus without complications: Secondary | ICD-10-CM | POA: Insufficient documentation

## 2015-09-09 DIAGNOSIS — J189 Pneumonia, unspecified organism: Secondary | ICD-10-CM | POA: Diagnosis present

## 2015-09-09 DIAGNOSIS — G4733 Obstructive sleep apnea (adult) (pediatric): Secondary | ICD-10-CM

## 2015-09-09 LAB — BASIC METABOLIC PANEL
Anion gap: 9 (ref 5–15)
BUN: 15 mg/dL (ref 6–20)
CO2: 20 mmol/L — ABNORMAL LOW (ref 22–32)
Calcium: 7.5 mg/dL — ABNORMAL LOW (ref 8.9–10.3)
Chloride: 107 mmol/L (ref 101–111)
Creatinine, Ser: 0.89 mg/dL (ref 0.61–1.24)
GFR calc Af Amer: >60 mL/min (ref >60)
GFR calc non Af Amer: >60 mL/min (ref >60)
Glucose, Bld: 150 mg/dL — ABNORMAL HIGH (ref 65–99)
Potassium: 4.1 mmol/L (ref 3.5–5.1)
Sodium: 136 mmol/L (ref 135–145)

## 2015-09-09 LAB — LACTIC ACID, PLASMA
Lactic Acid, Venous: 2.3 mmol/L (ref 0.5–2.0)
Lactic Acid, Venous: 2 mmol/L (ref 0.5–2.0)

## 2015-09-09 LAB — CBC
HCT: 32.6 % — ABNORMAL LOW (ref 39.0–52.0)
Hemoglobin: 10.9 g/dL — ABNORMAL LOW (ref 13.0–17.0)
MCH: 29.5 pg (ref 26.0–34.0)
MCHC: 33.4 g/dL (ref 30.0–36.0)
MCV: 88.3 fL (ref 78.0–100.0)
Platelets: 157 10*3/uL (ref 150–400)
RBC: 3.69 MIL/uL — ABNORMAL LOW (ref 4.22–5.81)
RDW: 14.8 % (ref 11.5–15.5)
WBC: 22.7 10*3/uL — ABNORMAL HIGH (ref 4.0–10.5)

## 2015-09-09 LAB — PROTIME-INR
INR: 1.22 (ref 0.00–1.49)
Prothrombin Time: 15.6 seconds — ABNORMAL HIGH (ref 11.6–15.2)

## 2015-09-09 LAB — I-STAT ARTERIAL BLOOD GAS, ED
Acid-base deficit: 5 mmol/L — ABNORMAL HIGH (ref 0.0–2.0)
Bicarbonate: 20.1 mEq/L (ref 20.0–24.0)
O2 Saturation: 94 %
TCO2: 21 mmol/L (ref 0–100)
pCO2 arterial: 35 mmHg (ref 35.0–45.0)
pH, Arterial: 7.367 (ref 7.350–7.450)
pO2, Arterial: 71 mmHg — ABNORMAL LOW (ref 80.0–100.0)

## 2015-09-09 LAB — I-STAT CG4 LACTIC ACID, ED: Lactic Acid, Venous: 1.74 mmol/L (ref 0.5–2.0)

## 2015-09-09 LAB — GLUCOSE, CAPILLARY
Glucose-Capillary: 165 mg/dL — ABNORMAL HIGH (ref 65–99)
Glucose-Capillary: 148 mg/dL — ABNORMAL HIGH (ref 65–99)
Glucose-Capillary: 136 mg/dL — ABNORMAL HIGH (ref 65–99)

## 2015-09-09 LAB — APTT: aPTT: 33 seconds (ref 24–37)

## 2015-09-09 LAB — MRSA PCR SCREENING: MRSA by PCR: NEGATIVE

## 2015-09-09 LAB — PROCALCITONIN: Procalcitonin: 4.15 ng/mL

## 2015-09-09 LAB — STREP PNEUMONIAE URINARY ANTIGEN: Strep Pneumo Urinary Antigen: NEGATIVE

## 2015-09-09 LAB — BRAIN NATRIURETIC PEPTIDE
B Natriuretic Peptide: 274.4 pg/mL — ABNORMAL HIGH (ref 0.0–100.0)
B Natriuretic Peptide: 347.5 pg/mL — ABNORMAL HIGH (ref 0.0–100.0)

## 2015-09-09 LAB — CBG MONITORING, ED: Glucose-Capillary: 172 mg/dL — ABNORMAL HIGH (ref 65–99)

## 2015-09-09 MED ORDER — FUROSEMIDE 10 MG/ML IJ SOLN: 40.0000 mg | Freq: Two times a day (BID) | INTRAMUSCULAR | Status: DC

## 2015-09-09 MED ORDER — METOPROLOL TARTRATE 50 MG PO TABS
50.0000 mg | ORAL_TABLET | Freq: Two times a day (BID) | ORAL | Status: DC
  Administered 2015-09-09 – 2015-09-12 (×6): 50 mg via ORAL
  Filled 2015-09-09 (×7): qty 1

## 2015-09-09 MED ORDER — ALBUTEROL SULFATE (2.5 MG/3ML) 0.083% IN NEBU: 2.5000 mg | INHALATION_SOLUTION | Freq: Four times a day (QID) | RESPIRATORY_TRACT | Status: DC | PRN

## 2015-09-09 MED ORDER — ONDANSETRON HCL 4 MG/2ML IJ SOLN: 4.0000 mg | Freq: Four times a day (QID) | INTRAMUSCULAR | Status: DC | PRN

## 2015-09-09 MED ORDER — SODIUM CHLORIDE 0.9 % IV SOLN
INTRAVENOUS | Status: DC
  Administered 2015-09-09: via INTRAVENOUS

## 2015-09-09 MED ORDER — FUROSEMIDE 10 MG/ML IJ SOLN
60.0000 mg | Freq: Two times a day (BID) | INTRAMUSCULAR | Status: DC
  Administered 2015-09-09 – 2015-09-10 (×2): 60 mg via INTRAVENOUS
  Filled 2015-09-09 (×3): qty 6

## 2015-09-09 MED ORDER — PANTOPRAZOLE SODIUM 40 MG PO TBEC
40.0000 mg | DELAYED_RELEASE_TABLET | Freq: Every day | ORAL | Status: DC
  Administered 2015-09-09 – 2015-09-12 (×4): 40 mg via ORAL
  Filled 2015-09-09 (×4): qty 1

## 2015-09-09 MED ORDER — ENOXAPARIN SODIUM 40 MG/0.4ML ~~LOC~~ SOLN
40.0000 mg | SUBCUTANEOUS | Status: DC
  Administered 2015-09-09 – 2015-09-11 (×3): 40 mg via SUBCUTANEOUS
  Filled 2015-09-09 (×4): qty 0.4

## 2015-09-09 MED ORDER — DEXTROSE 5 % IV SOLN: 1.0000 g | Freq: Once | INTRAVENOUS | Status: DC

## 2015-09-09 MED ORDER — ONDANSETRON HCL 4 MG PO TABS: 4.0000 mg | ORAL_TABLET | Freq: Four times a day (QID) | ORAL | Status: DC | PRN

## 2015-09-09 MED ORDER — ALUM & MAG HYDROXIDE-SIMETH 200-200-20 MG/5ML PO SUSP: 30.0000 mL | Freq: Four times a day (QID) | ORAL | Status: DC | PRN

## 2015-09-09 MED ORDER — ATORVASTATIN CALCIUM 40 MG PO TABS
40.0000 mg | ORAL_TABLET | Freq: Every day | ORAL | Status: DC
  Administered 2015-09-09 – 2015-09-11 (×3): 40 mg via ORAL
  Filled 2015-09-09 (×4): qty 1

## 2015-09-09 MED ORDER — SODIUM CHLORIDE 0.9 % IV BOLUS (SEPSIS): 1000.0000 mL | INTRAVENOUS | Status: DC | PRN

## 2015-09-09 MED ORDER — ASPIRIN 325 MG PO TABS
325.0000 mg | ORAL_TABLET | Freq: Every day | ORAL | Status: DC
  Administered 2015-09-09 – 2015-09-12 (×4): 325 mg via ORAL
  Filled 2015-09-09 (×4): qty 1

## 2015-09-09 MED ORDER — SODIUM CHLORIDE 0.9 % IV BOLUS (SEPSIS)
1000.0000 mL | INTRAVENOUS | Status: DC
  Administered 2015-09-08: 1000 mL via INTRAVENOUS

## 2015-09-09 MED ORDER — OXYCODONE HCL 5 MG PO TABS: 5.0000 mg | ORAL_TABLET | ORAL | Status: DC | PRN

## 2015-09-09 MED ORDER — INSULIN LISPRO PROT & LISPRO (75-25 MIX) 100 UNIT/ML KWIKPEN: 25.0000 [IU] | PEN_INJECTOR | Freq: Every day | SUBCUTANEOUS | Status: DC

## 2015-09-09 MED ORDER — ACETAMINOPHEN 325 MG PO TABS: 650.0000 mg | ORAL_TABLET | Freq: Four times a day (QID) | ORAL | Status: DC | PRN

## 2015-09-09 MED ORDER — INSULIN ASPART 100 UNIT/ML ~~LOC~~ SOLN
0.0000 [IU] | Freq: Three times a day (TID) | SUBCUTANEOUS | Status: DC
  Administered 2015-09-09: 13:00:00 via SUBCUTANEOUS
  Administered 2015-09-09 – 2015-09-10 (×2): 2 [IU] via SUBCUTANEOUS
  Administered 2015-09-10: 5 [IU] via SUBCUTANEOUS
  Administered 2015-09-11: 2 [IU] via SUBCUTANEOUS
  Administered 2015-09-11: 5 [IU] via SUBCUTANEOUS
  Administered 2015-09-11 – 2015-09-12 (×2): 2 [IU] via SUBCUTANEOUS
  Filled 2015-09-09: qty 1

## 2015-09-09 MED ORDER — VANCOMYCIN HCL IN DEXTROSE 1-5 GM/200ML-% IV SOLN
1000.0000 mg | Freq: Two times a day (BID) | INTRAVENOUS | Status: DC
  Administered 2015-09-10 – 2015-09-11 (×3): 1000 mg via INTRAVENOUS
  Filled 2015-09-09 (×5): qty 200

## 2015-09-09 MED ORDER — INSULIN ASPART 100 UNIT/ML ~~LOC~~ SOLN
0.0000 [IU] | Freq: Every day | SUBCUTANEOUS | Status: DC
Start: 2015-09-09 — End: 2015-09-12
  Administered 2015-09-11: 2 [IU] via SUBCUTANEOUS

## 2015-09-09 MED ORDER — INSULIN ASPART PROT & ASPART (70-30 MIX) 100 UNIT/ML ~~LOC~~ SUSP
25.0000 [IU] | Freq: Every day | SUBCUTANEOUS | Status: DC
  Administered 2015-09-09 – 2015-09-11 (×3): 25 [IU] via SUBCUTANEOUS
  Filled 2015-09-09: qty 10

## 2015-09-09 MED ORDER — DEXTROSE 5 % IV SOLN: 1.0000 g | INTRAVENOUS | Status: DC

## 2015-09-09 MED ORDER — ALBUTEROL SULFATE (2.5 MG/3ML) 0.083% IN NEBU
2.5000 mg | INHALATION_SOLUTION | Freq: Four times a day (QID) | RESPIRATORY_TRACT | Status: DC
  Administered 2015-09-09 (×2): 2.5 mg via RESPIRATORY_TRACT
  Filled 2015-09-09 (×4): qty 3

## 2015-09-09 MED ORDER — DEXTROSE 5 % IV SOLN: 500.0000 mg | INTRAVENOUS | Status: DC

## 2015-09-09 MED ORDER — DEXTROSE 5 % IV SOLN: 500.0000 mg | Freq: Once | INTRAVENOUS | Status: DC

## 2015-09-09 MED ORDER — VANCOMYCIN HCL 10 G IV SOLR
2000.0000 mg | Freq: Once | INTRAVENOUS | Status: DC
  Filled 2015-09-09: qty 2000

## 2015-09-09 MED ORDER — HYDROMORPHONE HCL 1 MG/ML IJ SOLN: 0.5000 mg | INTRAMUSCULAR | Status: DC | PRN

## 2015-09-09 MED ORDER — ACETAMINOPHEN 650 MG RE SUPP: 650.0000 mg | Freq: Four times a day (QID) | RECTAL | Status: DC | PRN

## 2015-09-09 MED ORDER — INFLUENZA VAC SPLIT QUAD 0.5 ML IM SUSY: 0.5000 mL | PREFILLED_SYRINGE | INTRAMUSCULAR | Status: DC | PRN

## 2015-09-09 MED ORDER — CETYLPYRIDINIUM CHLORIDE 0.05 % MT LIQD
7.0000 mL | Freq: Two times a day (BID) | OROMUCOSAL | Status: DC
  Administered 2015-09-10 – 2015-09-11 (×4): 7 mL via OROMUCOSAL

## 2015-09-09 MED ORDER — ALBUTEROL SULFATE (2.5 MG/3ML) 0.083% IN NEBU: 2.5000 mg | INHALATION_SOLUTION | Freq: Once | RESPIRATORY_TRACT | Status: DC

## 2015-09-09 NOTE — H&P (Signed)
[facility] Admission History and Physical       [name] [id number] DOB: 07-10-1941 DOA: 09/08/2015  Referring physician: EDP PCP: Joycelyn Man, MD  Specialists:   Chief Complaint: Fever Chills Cough  HPI: [name] is a 74 y.o. male with a history of CVA x 2, CAD, HTN, DM-1, Hyperlipidemia who presents to the ED with complaints of Fevers and Chills and Non-Productive Cough x 2 days.  He report having fever to 105.3 at home today.  He was evaluated in the ED and found to have bibasilar opacities on chest X-ray, along with an elevated lactic Acid at 2.59.  He was placed on IV Rocephin and Azithromycin and referred for admission.     Review of Systems:  Constitutional: No Weight Loss, No Weight Gain, Night Sweats, +Fevers, +Chills, Dizziness, Light Headedness, Fatigue, or Generalized Weakness HEENT: No Headaches, Difficulty Swallowing,Tooth/Dental Problems,Sore Throat,  No Sneezing, Rhinitis, Ear Ache, Nasal Congestion, or Post Nasal Drip,  Cardio-vascular:  No Chest pain, Orthopnea, PND, Edema in Lower Extremities, Anasarca, Dizziness, Palpitations  Resp: +Dyspnea, No DOE,+Non-Productive Cough, No Hemoptysis, No Wheezing.    GI: No Heartburn, Indigestion, Abdominal Pain, Nausea, Vomiting, Diarrhea, Constipation, Hematemesis, Hematochezia, Melena, Change in Bowel Habits,  Loss of Appetite  GU: No Dysuria, No Change in Color of Urine, No Urgency or Urinary Frequency, No Flank pain.  Musculoskeletal: No Joint Pain or Swelling, No Decreased Range of Motion, No Back Pain.  Neurologic: No Syncope, No Seizures, Muscle Weakness, Paresthesia, Vision Disturbance or Loss, No Diplopia, No Vertigo, No Difficulty Walking,  Skin: No Rash or Lesions. Psych: No Change in Mood or Affect, No Depression or Anxiety, No Memory loss, No Confusion, or Hallucinations   Past Medical History  Diagnosis Date  . Diabetes mellitus   . GERD (gastroesophageal reflux  disease)   . Hyperlipidemia   . Hypertension   . CVA (cerebral infarction) 2001, 2004  . CAD (coronary artery disease)     a. 06/2007 PCI LAD->Cypher DES;  b. 02/2010 Cath LM 30-40, LAD 40-42m, patent stent, D2 50, RI 70 ost, LCX 50, OM 30, RCA 30-33m, 30d, PDA 50, RPL 40-50p, nl EF.  . Carotid artery occlusion     a. 07/2011 L CEA;  b. 02/2012 Carotid U/S: RICA [id number], LICA patent.  . Peripheral vascular disease 2001  . History of blood transfusion   . Stroke   . OSA (obstructive sleep apnea) 07/20/2015     Past Surgical History  Procedure Laterality Date  . Angioplasty  2008, 2010    stent placement 2008  . Kidney stone removal  1969  . Inguinal hernia repair  1962    right  . Pr vein bypass graft,aorto-fem-pop  2008  . Carotid endarterectomy Left Aug. 24, 2012    cea  . Heart stent  06-30-07      Prior to Admission medications   Medication Sig Start Date End Date Taking? Authorizing Provider  amLODipine (NORVASC) 10 MG tablet TAKE 1 TABLET BY MOUTH ONCE DAILY 09/13/14  Yes Dorena Cookey, MD  aspirin 325 MG tablet Take 325 mg by mouth daily.    Yes Historical Provider, MD  atenolol (TENORMIN) 100 MG tablet TAKE 1 TABLET TWICE A DAY 09/13/14  Yes Dorena Cookey, MD  atorvastatin (LIPITOR) 40 MG tablet TAKE 1 TABLET (40 MG TOTAL) BY MOUTH DAILY. 06/09/15  Yes Lelon Perla, MD  furosemide (LASIX) 40 MG tablet TAKE 60 MG DAILY 09/13/14  Yes Dorena Cookey,  MD  glipiZIDE (GLUCOTROL) 10 MG tablet TAKE 1 TABLET (10 MG TOTAL) BY MOUTH 2 (TWO) TIMES DAILY BEFORE A MEAL. 09/13/14  Yes Dorena Cookey, MD  Insulin Lispro Prot & Lispro (HUMALOG 75/25 MIX) (75-25) 100 UNIT/ML Kwikpen Inject 30 Units into the skin 1 day or 1 dose. INJECT 30 UNITS INTO THE SKIN DAILY WITH SUPPER. Patient taking differently: Inject 25 Units into the skin at bedtime.  12/12/14  Yes Dorena Cookey, MD  lisinopril (PRINIVIL,ZESTRIL) 40 MG tablet Take 1 tablet (40 mg total) by mouth daily. 09/13/14  Yes Dorena Cookey, MD   metFORMIN (GLUCOPHAGE) 1000 MG tablet TAKE 1 TABLET BY MOUTH TWICE DAILY WITH MEALS 09/13/14  Yes Dorena Cookey, MD  nitroGLYCERIN (NITROSTAT) 0.4 MG SL tablet PLACE 1 TABLET (0.4 MG TOTAL) UNDER THE TONGUE EVERY 5 (FIVE) MINUTES AS NEEDED. 09/13/14  Yes Dorena Cookey, MD  omeprazole (PRILOSEC) 20 MG capsule TAKE ONE CAPSULE BY MOUTH EVERY DAY 09/13/14  Yes Dorena Cookey, MD  potassium chloride SA (K-DUR,KLOR-CON) 20 MEQ tablet Take 2 tabs twice daily 09/13/14  Yes Dorena Cookey, MD  furosemide (LASIX) 40 MG tablet TAKE 1 TABLET BY MOUTH IN THE AM, 1/2 TABLET IN THE EVENING AS DIRECTED *TIME FOR AN OFFICE VISIT* 08/15/15   Dorena Cookey, MD  glucose blood (ONE TOUCH TEST STRIPS) test strip 1 each by Other route daily. 09/13/14   Dorena Cookey, MD  Insulin Pen Needle 32G X 4 MM MISC Use once daily. Dx E11.9 04/17/15   Dorena Cookey, MD     Allergies  Allergen Reactions  . Clopidogrel Bisulfate Itching and Rash    REACTION: rash, itching from Head to Toe   -  PLAVIX   . Adhesive [Tape] Rash    rash    Social History:  reports that he has never smoked. He has never used smokeless tobacco. He reports that he does not drink alcohol or use illicit drugs.    Family History  Problem Relation Age of Onset  . Heart disease Mother     CHF Heart Disease before age 85  . Heart disease Father     Heart Disease before age 45  . Cancer Brother     Lung  . Cancer Brother     Lung  . COPD Brother        Physical Exam:  GEN:  Pleasant Obese Elderly  74 y.o. Caucasian male examined and in no acute distress; cooperative with exam Filed Vitals:   09/08/15 2230 09/08/15 2300 09/08/15 2315 09/08/15 2330  BP: 133/39 136/41    Pulse: 82 75 78 76  Temp:      TempSrc:      Resp: 30 29    Height:      Weight:      SpO2: 95% 93% 94% 94%   Blood pressure 136/41, pulse 76, temperature 99.5 F (37.5 C), temperature source Oral, resp. rate 29, height 5' 9.5" (1.765 m), weight 120.203 kg (265 lb),  SpO2 94 %. PSYCH: He is alert and oriented x4; does not appear anxious does not appear depressed; affect is normal HEENT: Normocephalic and Atraumatic, Mucous membranes pink; PERRLA; EOM intact; Fundi:  Benign;  No scleral icterus, Nares: Patent, Oropharynx: Clear, Fair Dentition,    Neck:  FROM, No Cervical Lymphadenopathy nor Thyromegaly or Carotid Bruit; No JVD; Breasts:: Not examined CHEST WALL: No tenderness CHEST: Normal respiration, clear to auscultation bilaterally HEART: Regular rate and rhythm;  no murmurs rubs or gallops BACK: No kyphosis or scoliosis; No CVA tenderness ABDOMEN: Positive Bowel Sounds, Obese, Soft Non-Tender, No Rebound or Guarding; No Masses, No Organomegaly. Rectal Exam: Not done EXTREMITIES: No Cyanosis, Clubbing, +BLE Edema; No Ulcerations. Genitalia: not examined PULSES: 2+ and symmetric SKIN: Normal hydration no rash or ulceration CNS:  Alert and Oriented x 4, No Focal Deficits Vascular: pulses palpable throughout    Labs on Admission:  Basic Metabolic Panel:  Recent Labs Lab 09/08/15 2102  NA 138  K 4.0  CL 105  CO2 25  GLUCOSE 143*  BUN 15  CREATININE 1.01  CALCIUM 9.4   Liver Function Tests:  Recent Labs Lab 09/08/15 2102  AST 23  ALT 17  ALKPHOS 52  BILITOT 1.3*  PROT 6.7  ALBUMIN 3.5   No results for input(s): LIPASE, AMYLASE in the last 168 hours. No results for input(s): AMMONIA in the last 168 hours. CBC:  Recent Labs Lab 09/08/15 2102  WBC 16.2*  NEUTROABS 15.0*  HGB 13.0  HCT 38.7*  MCV 88.4  PLT 207   Cardiac Enzymes: No results for input(s): CKTOTAL, CKMB, CKMBINDEX, TROPONINI in the last 168 hours.  BNP (last 3 results) No results for input(s): BNP in the last 8760 hours.  ProBNP (last 3 results) No results for input(s): PROBNP in the last 8760 hours.  CBG: No results for input(s): GLUCAP in the last 168 hours.  Radiological Exams on Admission: Dg Chest Portable 1 View  09/08/2015   CLINICAL DATA:   Initial evaluation for acute chest pain, cough.  EXAM: PORTABLE CHEST - 1 VIEW  COMPARISON:  Prior radiograph from 06/28/2015.  FINDINGS: Patient is rotated to the right. Allowing for rotation, cardiac and mediastinal silhouettes are stable in size and contour, and remain within normal limits.  Lungs are mildly hypoinflated. Linear opacity right lung base most compatible with scarring, stable. Mild diffuse prominence of the interstitial markings is not significantly changed. There is slightly more prominent patchy bibasilar opacities as compared to prior study, which may reflect vascular crowding or possibly developing infectious infiltrates. Mid and upper lungs are clear. No pulmonary edema. No definite pleural effusion on this limited single view of the chest. No pneumothorax.  No acute osseus abnormality.  Surgical clips overlie the left neck.  IMPRESSION: 1. Mild bibasilar patchy opacities, which may reflect vascular crowding/atelectasis or possibly developing infectious infiltrates. 2. Stable linear scarring at the right lung base.   Electronically Signed   By: Jeannine Boga M.D.   On: 09/08/2015 22:08     EKG: Independently reviewed.    Assessment/Plan:   74 y.o. male with  Principal Problem:   1.    Sepsis/CAP (community acquired pneumonia)   Sepsis Protocol   IV Rocephin and Azithromycin   IVFs   Albuterol Nebs    O2 PRN   Active Problems:   2.   Hypotension   IVFs   Hold Anti-Hypertensives   Monitor BPs     3.   Leukocytosis   Monitor Trend     4.   CAD, NATIVE VESSEL   Stable     5.   Type I diabetes mellitus with complication, uncontrolled   Continue Insulin 75/25 once daily   Hold Metformin Rx   SSI coverage PRN   Check Hb A1C     6.   OSA (obstructive sleep apnea)   On CPAP qhs     7.   DVT Prophylaxis   Lovenox  Code Status:     FULL CODE        Family Communication:  No Family Present    Disposition Plan:    Inpatient Status         Time spent:  [facility] Pager [phone]   If [place] Please Contact the Day Rounding Team MD for [facility]  If 7PM-7AM, Please Contact Night-Floor Coverage  [email or web address] Password [email or web address] 09/09/2015, 12:10 AM     ADDENDUM:   Patient was seen and examined on 09/09/2015

## 2015-09-09 NOTE — ED Notes (Signed)
Patient CBG was 172, Nurse was informed.

## 2015-09-09 NOTE — Progress Notes (Signed)
Patient Demographics:    [name], is a 74 y.o. male, DOB - 1941/08/17, [id number]  Admit date - 09/08/2015   Admitting Physician No admitting provider for patient encounter.  Outpatient Primary MD for the patient is [name],[name] [name], MD  LOS - 1   Chief Complaint  Patient presents with  . Code Sepsis        Subjective:    [name] today has, No headache, No chest pain, No abdominal pain - No Nausea, No new weakness tingling or numbness, Improved Cough - SOB.     Assessment  & Plan :     1. Acute hypoxic respiratory failure due to community-acquired pneumonia with early sepsis. Sepsis much improved, blood pressures are stable, lactic acid borderline, blood cultures have been positive within a few hours. ABGs are satisfactory on 2 L nasal cannula, he feels much better. We'll follow final culture results. Have placed him on appropriate and I biotics for ICU/stepdown with vancomycin and Rocephin and azithromycin. Continue oxygen and nebulizer treatments. IV fluid bolus if blood pressure drops. Has been hydrated in the ER. We'll keep him in stepdown for another 1-2 days.   2. 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 Has 2+ leg edema, rales on exam, facial and periorbital edema, confirms that he has missed several doses of home Lasix. As blood pressure is improved we'll hold IV fluids. IV Lasix. Repeat echogram. Check proBNP.   3. CAD. Status post stent placement. On aspirin and statin as secondary prevention which will be continued, will place on low-dose beta blocker and monitor.   4. Hypertension. Since he was septic for now will continue only on Lasix and low-dose beta blocker, hold his Norvasc and ACE inhibitor and monitor.   5.OSA - CPAP QHS and in am if  sleeping.   6. Dyslipidemia. Placed on statin.   7. GERD. On PPI continue.    8. DM type II. Hold oral hypoglycemics along with Glucophage, place on sliding scale insulin. Check A1c.    Code Status : Full  Family Communication  : Son bedside  Disposition Plan  : Step Down  Consults  :  None  Procedures  :    DVT Prophylaxis  :  Lovenox   Lab Results  Component Value Date   PLT 157 09/09/2015    Inpatient Medications  Scheduled Meds: . albuterol  2.5 mg Nebulization Q6H  . albuterol  2.5 mg Nebulization Once  . aspirin  325 mg Oral Daily  . atorvastatin  40 mg Oral q1800  . enoxaparin (LOVENOX) injection  40 mg Subcutaneous Q24H  . furosemide  60 mg Intravenous BID  . Insulin Lispro Prot & Lispro  25 Units Subcutaneous QHS  . pantoprazole  40 mg Oral Daily   Continuous Infusions: . azithromycin    . cefTRIAXone (ROCEPHIN)  IV    . sodium chloride     PRN Meds:.acetaminophen **OR** [DISCONTINUED] acetaminophen, alum & mag hydroxide-simeth, HYDROmorphone (DILAUDID) injection, [DISCONTINUED] ondansetron **OR** ondansetron (ZOFRAN) IV, oxyCODONE, sodium chloride  Antibiotics  :     Anti-infectives    Start     Dose/Rate Route Frequency Ordered Stop   09/10/15 2200  cefTRIAXone (ROCEPHIN) 1 g in dextrose 5 %  50 mL IVPB  Status:  Discontinued     1 g 100 mL/hr over 30 Minutes Intravenous  Once 09/09/15 0014 09/09/15 0023   09/10/15 2200  azithromycin (ZITHROMAX) 500 mg in dextrose 5 % 250 mL IVPB  Status:  Discontinued     500 mg 250 mL/hr over 60 Minutes Intravenous  Once 09/09/15 0014 09/09/15 0023   09/09/15 2230  azithromycin (ZITHROMAX) 500 mg in dextrose 5 % 250 mL IVPB     500 mg 250 mL/hr over 60 Minutes Intravenous Every 24 hours 09/08/15 2225     09/09/15 2200  cefTRIAXone (ROCEPHIN) 1 g in dextrose 5 % 50 mL IVPB     1 g 100 mL/hr over 30 Minutes Intravenous Every 24 hours 09/08/15 2224     09/09/15 1115  cefTRIAXone (ROCEPHIN) 1 g in dextrose 5 %  50 mL IVPB  Status:  Discontinued     1 g 100 mL/hr over 30 Minutes Intravenous Every 24 hours 09/09/15 1101 09/09/15 1102   09/09/15 1115  azithromycin (ZITHROMAX) 500 mg in dextrose 5 % 250 mL IVPB  Status:  Discontinued     500 mg 250 mL/hr over 60 Minutes Intravenous Every 24 hours 09/09/15 1101 09/09/15 1102   09/08/15 2215  cefTRIAXone (ROCEPHIN) 1 g in dextrose 5 % 50 mL IVPB     1 g 100 mL/hr over 30 Minutes Intravenous  Once 09/08/15 2213 09/08/15 2307   09/08/15 2215  azithromycin (ZITHROMAX) 500 mg in dextrose 5 % 250 mL IVPB     500 mg 250 mL/hr over 60 Minutes Intravenous  Once 09/08/15 2213 09/09/15 0435        Objective:   Filed Vitals:   09/09/15 0945 09/09/15 1015 09/09/15 1030 09/09/15 1045  BP: 122/37 130/52 132/51 132/68  Pulse: 78 78 86 77  Temp:      TempSrc:      Resp:      Height:      Weight:      SpO2: 97% 95% 100% 95%    Wt Readings from Last 3 Encounters:  09/08/15 120.203 kg (265 lb)  06/28/15 124.286 kg (274 lb)  04/26/15 122.607 kg (270 lb 4.8 oz)     Intake/Output Summary (Last 24 hours) at 09/09/15 1108 Last data filed at 09/09/15 0435  Gross per 24 hour  Intake   5300 ml  Output    100 ml  Net   5200 ml     Physical Exam  Awake Alert, Oriented X 3, No new F.N deficits, Normal affect [place].AT,PERRAL Supple Neck,No JVD, No cervical lymphadenopathy appriciated.  Symmetrical Chest wall movement, Good air movement bilaterally, ++ rales RRR,No Gallops,Rubs or new Murmurs, No Parasternal Heave +ve B.Sounds, Abd Soft, No tenderness, No organomegaly appriciated, No rebound - guarding or rigidity. No Cyanosis, Clubbing , 2+ edema, No new Rash or bruise      Data Review:   Micro Results Recent Results (from the past 240 hour(s))  Culture, blood (routine x 2)     Status: None (Preliminary result)   Collection Time: 09/08/15  8:50 PM  Result Value Ref Range Status   Specimen Description BLOOD RIGHT ARM  Final   Special Requests BOTTLES  DRAWN AEROBIC AND ANAEROBIC 5CC  Final   Culture  Setup Time   Final    GRAM POSITIVE COCCI IN CHAINS IN PAIRS IN BOTH AEROBIC AND ANAEROBIC BOTTLES CRITICAL RESULT CALLED TO, READ BACK BY AND VERIFIED WITH: S. SNIDER,RN AT 1059 ON  [place]    Culture GRAM POSITIVE COCCI  Final   Report Status PENDING  Incomplete  Culture, blood (routine x 2)     Status: None (Preliminary result)   Collection Time: 09/08/15  9:01 PM  Result Value Ref Range Status   Specimen Description BLOOD RIGHT HAND  Final   Special Requests BOTTLES DRAWN AEROBIC AND ANAEROBIC 5CC  Final   Culture  Setup Time   Final    GRAM POSITIVE COCCI IN CHAINS IN PAIRS ANAEROBIC BOTTLE ONLY CRITICAL RESULT CALLED TO, READ BACK BY AND VERIFIED WITH: Storm Frisk, RN AT 1059 ON [id number] BY Rhea Bleacher    Culture GRAM POSITIVE COCCI  Final   Report Status PENDING  Incomplete  Urine culture     Status: None (Preliminary result)   Collection Time: 09/08/15  9:30 PM  Result Value Ref Range Status   Specimen Description URINE, CLEAN CATCH  Final   Special Requests NONE  Final   Culture TOO YOUNG TO READ  Final   Report Status PENDING  Incomplete    Radiology Reports Dg Chest Portable 1 View  09/08/2015   CLINICAL DATA:  Initial evaluation for acute chest pain, cough.  EXAM: PORTABLE CHEST - 1 VIEW  COMPARISON:  Prior radiograph from 06/28/2015.  FINDINGS: Patient is rotated to the right. Allowing for rotation, cardiac and mediastinal silhouettes are stable in size and contour, and remain within normal limits.  Lungs are mildly hypoinflated. Linear opacity right lung base most compatible with scarring, stable. Mild diffuse prominence of the interstitial markings is not significantly changed. There is slightly more prominent patchy bibasilar opacities as compared to prior study, which may reflect vascular crowding or possibly developing infectious infiltrates. Mid and upper lungs are clear. No pulmonary edema. No definite  pleural effusion on this limited single view of the chest. No pneumothorax.  No acute osseus abnormality.  Surgical clips overlie the left neck.  IMPRESSION: 1. Mild bibasilar patchy opacities, which may reflect vascular crowding/atelectasis or possibly developing infectious infiltrates. 2. Stable linear scarring at the right lung base.   Electronically Signed   By: Jeannine Boga M.D.   On: 09/08/2015 22:08     CBC  Recent Labs Lab 09/08/15 2102 09/09/15 0427  WBC 16.2* 22.7*  HGB 13.0 10.9*  HCT 38.7* 32.6*  PLT 207 157  MCV 88.4 88.3  MCH 29.7 29.5  MCHC 33.6 33.4  RDW 14.4 14.8  LYMPHSABS 0.6*  --   MONOABS 0.6  --   EOSABS 0.0  --   BASOSABS 0.0  --     Chemistries   Recent Labs Lab 09/08/15 2102 09/09/15 0427  NA 138 136  K 4.0 4.1  CL 105 107  CO2 25 20*  GLUCOSE 143* 150*  BUN 15 15  CREATININE 1.01 0.89  CALCIUM 9.4 7.5*  AST 23  --   ALT 17  --   ALKPHOS 52  --   BILITOT 1.3*  --    ------------------------------------------------------------------------------------------------------------------ estimated creatinine clearance is 93.9 mL/min (by C-G formula based on Cr of 0.89). ------------------------------------------------------------------------------------------------------------------ No results for input(s): HGBA1C in the last 72 hours. ------------------------------------------------------------------------------------------------------------------ No results for input(s): CHOL, HDL, LDLCALC, TRIG, CHOLHDL, LDLDIRECT in the last 72 hours. ------------------------------------------------------------------------------------------------------------------ No results for input(s): TSH, T4TOTAL, T3FREE, THYROIDAB in the last 72 hours.  Invalid input(s): FREET3 ------------------------------------------------------------------------------------------------------------------ No results for input(s): VITAMINB12, FOLATE, FERRITIN, TIBC, IRON,  RETICCTPCT in the last 72 hours.  Coagulation profile  Recent Labs Lab 09/09/15  0033  INR 1.22    No results for input(s): DDIMER in the last 72 hours.  Cardiac Enzymes No results for input(s): CKMB, TROPONINI, MYOGLOBIN in the last 168 hours.  Invalid input(s): CK ------------------------------------------------------------------------------------------------------------------ Invalid input(s): POCBNP   Time Spent in minutes  35   Jamonta Goerner K M.D on 09/09/2015 at 11:08 AM  Between 7am to 7pm - Pager - [phone]  After 7pm go to [email or web address] - password [email or web address]  [facility] -  Office  [phone]

## 2015-09-09 NOTE — ED Notes (Signed)
Report attempted 

## 2015-09-09 NOTE — Progress Notes (Signed)
Pt has home CPAP machine setup in room.  Pt stated that he will self administer CPAP when ready for bed.  RT bled in 3 LPM O2 through CPAP machine for Pt use tonight.  Pt to notify RT if any assistance is needed.  RT to monitor and assess as needed.

## 2015-09-09 NOTE — ED Notes (Addendum)
[facility] rounding team paged about patient's breathing.  Patient is working harder to breath, however spo2 is 96 3 liters nasal cannula, lung sounds are clear.

## 2015-09-09 NOTE — Progress Notes (Signed)
ANTIBIOTIC CONSULT NOTE - FOLLOW UP  Pharmacy Consult for Vancomycin Indication: PNA / bacteremia  Allergies  Allergen Reactions  . Clopidogrel Bisulfate Itching and Rash    REACTION: rash, itching from Head to Toe   -  PLAVIX   . Adhesive [Tape] Rash    rash    Patient Measurements: Height: 5' 9.5" (176.5 cm) Weight: 265 lb (120.203 kg) IBW/kg (Calculated) : 71.85  Vital Signs: Temp: 98.5 F (36.9 C) (09/10 1123) Temp Source: Oral (09/10 1123) BP: 111/88 mmHg (09/10 1123) Pulse Rate: 74 (09/10 1123) Intake/Output from previous day: 09/09 0701 - 09/10 0700 In: 5300 [I.V.:5300] Out: 100 [Urine:100] Intake/Output from this shift:    Labs:  Recent Labs  09/08/15 2102 09/09/15 0427  WBC 16.2* 22.7*  HGB 13.0 10.9*  PLT 207 157  CREATININE 1.01 0.89   Estimated Creatinine Clearance: 93.9 mL/min (by C-G formula based on Cr of 0.89). No results for input(s): VANCOTROUGH, VANCOPEAK, VANCORANDOM, GENTTROUGH, GENTPEAK, GENTRANDOM, TOBRATROUGH, TOBRAPEAK, TOBRARND, AMIKACINPEAK, AMIKACINTROU, AMIKACIN in the last 72 hours.   Microbiology: Recent Results (from the past 720 hour(s))  Culture, blood (routine x 2)     Status: None (Preliminary result)   Collection Time: 09/08/15  8:50 PM  Result Value Ref Range Status   Specimen Description BLOOD RIGHT ARM  Final   Special Requests BOTTLES DRAWN AEROBIC AND ANAEROBIC 5CC  Final   Culture  Setup Time   Final    GRAM POSITIVE COCCI IN CHAINS IN PAIRS IN BOTH AEROBIC AND ANAEROBIC BOTTLES CRITICAL RESULT CALLED TO, READ BACK BY AND VERIFIED WITH: S. SNIDER,RN AT 1059 ON [id number] BY Rhea Bleacher    Culture GRAM POSITIVE COCCI  Final   Report Status PENDING  Incomplete  Culture, blood (routine x 2)     Status: None (Preliminary result)   Collection Time: 09/08/15  9:01 PM  Result Value Ref Range Status   Specimen Description BLOOD RIGHT HAND  Final   Special Requests BOTTLES DRAWN AEROBIC AND ANAEROBIC 5CC  Final   Culture   Setup Time   Final    GRAM POSITIVE COCCI IN CHAINS IN PAIRS ANAEROBIC BOTTLE ONLY CRITICAL RESULT CALLED TO, READ BACK BY AND VERIFIED WITH: Storm Frisk, RN AT 1059 ON [id number] BY Rhea Bleacher    Culture GRAM POSITIVE COCCI  Final   Report Status PENDING  Incomplete  Urine culture     Status: None (Preliminary result)   Collection Time: 09/08/15  9:30 PM  Result Value Ref Range Status   Specimen Description URINE, CLEAN CATCH  Final   Special Requests NONE  Final   Culture TOO YOUNG TO READ  Final   Report Status PENDING  Incomplete    Anti-infectives    Start     Dose/Rate Route Frequency Ordered Stop   09/10/15 2200  cefTRIAXone (ROCEPHIN) 1 g in dextrose 5 % 50 mL IVPB  Status:  Discontinued     1 g 100 mL/hr over 30 Minutes Intravenous  Once 09/09/15 0014 09/09/15 0023   09/10/15 2200  azithromycin (ZITHROMAX) 500 mg in dextrose 5 % 250 mL IVPB  Status:  Discontinued     500 mg 250 mL/hr over 60 Minutes Intravenous  Once 09/09/15 0014 09/09/15 0023   09/09/15 2230  azithromycin (ZITHROMAX) 500 mg in dextrose 5 % 250 mL IVPB     500 mg 250 mL/hr over 60 Minutes Intravenous Every 24 hours 09/08/15 2225     09/09/15 2200  cefTRIAXone (ROCEPHIN) 1 g  in dextrose 5 % 50 mL IVPB     1 g 100 mL/hr over 30 Minutes Intravenous Every 24 hours 09/08/15 2224     09/09/15 1145  vancomycin (VANCOCIN) 2,000 mg in sodium chloride 0.9 % 500 mL IVPB     2,000 mg 250 mL/hr over 120 Minutes Intravenous  Once 09/09/15 1131     09/09/15 1115  cefTRIAXone (ROCEPHIN) 1 g in dextrose 5 % 50 mL IVPB  Status:  Discontinued     1 g 100 mL/hr over 30 Minutes Intravenous Every 24 hours 09/09/15 1101 09/09/15 1102   09/09/15 1115  azithromycin (ZITHROMAX) 500 mg in dextrose 5 % 250 mL IVPB  Status:  Discontinued     500 mg 250 mL/hr over 60 Minutes Intravenous Every 24 hours 09/09/15 1101 09/09/15 1102   09/08/15 2215  cefTRIAXone (ROCEPHIN) 1 g in dextrose 5 % 50 mL IVPB     1 g 100 mL/hr over 30 Minutes  Intravenous  Once 09/08/15 2213 09/08/15 2307   09/08/15 2215  azithromycin (ZITHROMAX) 500 mg in dextrose 5 % 250 mL IVPB     500 mg 250 mL/hr over 60 Minutes Intravenous  Once 09/08/15 2213 09/09/15 0435      Assessment: 74 yo M presents on 9/9 with fever, chills, and cough. CXR showed bibasilar opacities and abx were started for CAP. Now to add vancomycin per pharmacy as blood cx's came back 2/2 of GPC in chains and pairs. Afebrile, WBC rising to 22.7. SCr 0.89, normalized CrCl ~52ml/min.  Goal of Therapy:  Vancomycin trough level 15-20 mcg/ml  Resolution of infection  Plan:  Give vancomycin 2g IV x 1, then start 1g IV Q12 Continue ceftriaxone 1g IV Q24 per MD Continue azithromycin 500mg  IV Q24 per MD Monitor clinical picture, renal function, VT at Css F/U C&S, abx deescalation / LOT  Adylee Leonardo J 09/09/2015,11:32 AM

## 2015-09-09 NOTE — ED Notes (Signed)
Microbiology called: Positive blood cultures in anaerobe bottles in 2 sets Gram positive cocci in pairs and chains.

## 2015-09-10 ENCOUNTER — Inpatient Hospital Stay (HOSPITAL_COMMUNITY): Payer: Medicare Other

## 2015-09-10 DIAGNOSIS — E1069 Type 1 diabetes mellitus with other specified complication: Secondary | ICD-10-CM

## 2015-09-10 LAB — CBC
HCT: 33.3 % — ABNORMAL LOW (ref 39.0–52.0)
Hemoglobin: 10.9 g/dL — ABNORMAL LOW (ref 13.0–17.0)
MCH: 29.1 pg (ref 26.0–34.0)
MCHC: 32.7 g/dL (ref 30.0–36.0)
MCV: 89 fL (ref 78.0–100.0)
Platelets: 143 10*3/uL — ABNORMAL LOW (ref 150–400)
RBC: 3.74 MIL/uL — ABNORMAL LOW (ref 4.22–5.81)
RDW: 15 % (ref 11.5–15.5)
WBC: 14.7 10*3/uL — ABNORMAL HIGH (ref 4.0–10.5)

## 2015-09-10 LAB — BASIC METABOLIC PANEL
Anion gap: 8 (ref 5–15)
BUN: 12 mg/dL (ref 6–20)
CO2: 23 mmol/L (ref 22–32)
Calcium: 8.1 mg/dL — ABNORMAL LOW (ref 8.9–10.3)
Chloride: 107 mmol/L (ref 101–111)
Creatinine, Ser: 0.92 mg/dL (ref 0.61–1.24)
GFR calc Af Amer: >60 mL/min (ref >60)
GFR calc non Af Amer: >60 mL/min (ref >60)
Glucose, Bld: 123 mg/dL — ABNORMAL HIGH (ref 65–99)
Potassium: 3.3 mmol/L — ABNORMAL LOW (ref 3.5–5.1)
Sodium: 138 mmol/L (ref 135–145)

## 2015-09-10 LAB — URINE CULTURE: Culture: 30000

## 2015-09-10 LAB — GLUCOSE, CAPILLARY
Glucose-Capillary: 98 mg/dL (ref 65–99)
Glucose-Capillary: 139 mg/dL — ABNORMAL HIGH (ref 65–99)
Glucose-Capillary: 205 mg/dL — ABNORMAL HIGH (ref 65–99)
Glucose-Capillary: 185 mg/dL — ABNORMAL HIGH (ref 65–99)

## 2015-09-10 MED ORDER — POTASSIUM CHLORIDE CRYS ER 20 MEQ PO TBCR
40.0000 meq | EXTENDED_RELEASE_TABLET | ORAL | Status: AC
  Administered 2015-09-10 (×2): 40 meq via ORAL
  Filled 2015-09-10 (×2): qty 2

## 2015-09-10 MED ORDER — FUROSEMIDE 10 MG/ML IJ SOLN
40.0000 mg | Freq: Two times a day (BID) | INTRAMUSCULAR | Status: DC
  Administered 2015-09-10 – 2015-09-11 (×3): 40 mg via INTRAVENOUS
  Filled 2015-09-10 (×3): qty 4

## 2015-09-10 NOTE — Progress Notes (Signed)
Patient Demographics:    [name], is a 74 y.o. male, DOB - 11/15/1941, [id number]  Admit date - 09/08/2015   Admitting Physician Theressa Millard, MD  Outpatient Primary MD for the patient is [name],[name] Jacob Resides, MD  LOS - 2   Chief Complaint  Patient presents with  . Code Sepsis        Subjective:    [name] today has, No headache, No chest pain, No abdominal pain - No Nausea, No new weakness tingling or numbness, much improved Cough - SOB.     Assessment  & Plan :     1. Acute hypoxic respiratory failure due to community-acquired pneumonia with early sepsis. Sepsis much improved, blood pressures are stable, lactic acid borderline, blood cultures have been positive within a few hours, monitor final culture and sensitivity. For now continue with vancomycin and Rocephin and azithromycin. Continue oxygen and nebulizer treatments. IV fluid bolus if blood pressure drops. Improved transfer to Tele. Check CXR.    2. 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 Improved with IV Lasix will be continued. BNP was high, on beta blocker continue, echogram pending,   3. CAD. Status post stent placement. On aspirin and statin as secondary prevention which will be continued, will place on low-dose beta blocker and monitor.   4. Hypertension. Her pressure stable on Lasix along with beta blocker, hold his Norvasc and ACE inhibitor and monitor.   5.OSA - CPAP QHS and in am if sleeping.   6. Dyslipidemia. Placed on statin.   7. GERD. On PPI continue.    8. DM type II. Hold oral hypoglycemics along with Glucophage, placed on sliding scale insulin.   Lab Results  Component Value Date   HGBA1C 7.2* 07/17/2015    CBG (last 3)   Recent Labs  09/09/15 1715  09/09/15 2141 09/10/15 0802  GLUCAP 148* 136* 98        Code Status : Full  Family Communication  : Son bedside  Disposition Plan  : Step Down  Consults  :  None  Procedures  :    DVT Prophylaxis  :  Lovenox   Lab Results  Component Value Date   PLT 143* 09/10/2015    Inpatient Medications  Scheduled Meds: . albuterol  2.5 mg Nebulization Once  . antiseptic oral rinse  7 mL Mouth Rinse BID  . aspirin  325 mg Oral Daily  . atorvastatin  40 mg Oral q1800  . azithromycin  500 mg Intravenous Q24H  . cefTRIAXone (ROCEPHIN)  IV  1 g Intravenous Q24H  . enoxaparin (LOVENOX) injection  40 mg Subcutaneous Q24H  . furosemide  60 mg Intravenous BID  . insulin aspart  0-15 Units Subcutaneous TID WC  . insulin aspart  0-5 Units Subcutaneous QHS  . insulin aspart protamine- aspart  25 Units Subcutaneous QHS  . metoprolol tartrate  50 mg Oral BID  . pantoprazole  40 mg Oral Daily  . potassium chloride  40 mEq Oral Q4H  . vancomycin  2,000 mg Intravenous Once  . vancomycin  1,000 mg Intravenous Q12H   Continuous Infusions:   PRN Meds:.acetaminophen **OR** [DISCONTINUED] acetaminophen, albuterol, alum & mag hydroxide-simeth, HYDROmorphone (DILAUDID) injection, Influenza vac split  quadrivalent PF, [DISCONTINUED] ondansetron **OR** ondansetron (ZOFRAN) IV, oxyCODONE, sodium chloride  Antibiotics  :     Anti-infectives    Start     Dose/Rate Route Frequency Ordered Stop   09/10/15 2200  cefTRIAXone (ROCEPHIN) 1 g in dextrose 5 % 50 mL IVPB  Status:  Discontinued     1 g 100 mL/hr over 30 Minutes Intravenous  Once 09/09/15 0014 09/09/15 0023   09/10/15 2200  azithromycin (ZITHROMAX) 500 mg in dextrose 5 % 250 mL IVPB  Status:  Discontinued     500 mg 250 mL/hr over 60 Minutes Intravenous  Once 09/09/15 0014 09/09/15 0023   09/10/15 0100  vancomycin (VANCOCIN) IVPB 1000 mg/200 mL premix     1,000 mg 200 mL/hr over 60 Minutes Intravenous Every 12 hours 09/09/15 1142      09/09/15 2230  azithromycin (ZITHROMAX) 500 mg in dextrose 5 % 250 mL IVPB     500 mg 250 mL/hr over 60 Minutes Intravenous Every 24 hours 09/08/15 2225     09/09/15 2200  cefTRIAXone (ROCEPHIN) 1 g in dextrose 5 % 50 mL IVPB     1 g 100 mL/hr over 30 Minutes Intravenous Every 24 hours 09/08/15 2224     09/09/15 1145  vancomycin (VANCOCIN) 2,000 mg in sodium chloride 0.9 % 500 mL IVPB     2,000 mg 250 mL/hr over 120 Minutes Intravenous  Once 09/09/15 1131     09/09/15 1115  cefTRIAXone (ROCEPHIN) 1 g in dextrose 5 % 50 mL IVPB  Status:  Discontinued     1 g 100 mL/hr over 30 Minutes Intravenous Every 24 hours 09/09/15 1101 09/09/15 1102   09/09/15 1115  azithromycin (ZITHROMAX) 500 mg in dextrose 5 % 250 mL IVPB  Status:  Discontinued     500 mg 250 mL/hr over 60 Minutes Intravenous Every 24 hours 09/09/15 1101 09/09/15 1102   09/08/15 2215  cefTRIAXone (ROCEPHIN) 1 g in dextrose 5 % 50 mL IVPB     1 g 100 mL/hr over 30 Minutes Intravenous  Once 09/08/15 2213 09/08/15 2307   09/08/15 2215  azithromycin (ZITHROMAX) 500 mg in dextrose 5 % 250 mL IVPB     500 mg 250 mL/hr over 60 Minutes Intravenous  Once 09/08/15 2213 09/09/15 0435        Objective:   Filed Vitals:   09/09/15 2341 09/10/15 0150 09/10/15 0400 09/10/15 0804  BP:   144/57 124/40  Pulse:   65 73  Temp: 98.3 F (36.8 C)  98.3 F (36.8 C) 98.4 F (36.9 C)  TempSrc: Oral  Oral Oral  Resp:   30 25  Height:      Weight:      SpO2:  93% 92% 91%    Wt Readings from Last 3 Encounters:  09/09/15 120.203 kg (265 lb)  06/28/15 124.286 kg (274 lb)  04/26/15 122.607 kg (270 lb 4.8 oz)     Intake/Output Summary (Last 24 hours) at 09/10/15 0950 Last data filed at 09/10/15 0100  Gross per 24 hour  Intake    280 ml  Output   2200 ml  Net  -1920 ml     Physical Exam  Awake Alert, Oriented X 3, No new F.N deficits, Normal affect [place].AT,PERRAL Supple Neck,No JVD, No cervical lymphadenopathy appriciated.   Symmetrical Chest wall movement, Good air movement bilaterally, few rales RRR,No Gallops,Rubs or new Murmurs, No Parasternal Heave +ve B.Sounds, Abd Soft, No tenderness, No organomegaly appriciated, No rebound - guarding  or rigidity. No Cyanosis, Clubbing , 1+ edema, No new Rash or bruise      Data Review:   Micro Results Recent Results (from the past 240 hour(s))  Culture, blood (routine x 2)     Status: None (Preliminary result)   Collection Time: 09/08/15  8:50 PM  Result Value Ref Range Status   Specimen Description BLOOD RIGHT ARM  Final   Special Requests BOTTLES DRAWN AEROBIC AND ANAEROBIC 5CC  Final   Culture  Setup Time   Final    GRAM POSITIVE COCCI IN CHAINS IN PAIRS IN BOTH AEROBIC AND ANAEROBIC BOTTLES CRITICAL RESULT CALLED TO, READ BACK BY AND VERIFIED WITH: S. SNIDER,RN AT 1059 ON [id number] BY Rhea Bleacher    Culture GRAM POSITIVE COCCI  Final   Report Status PENDING  Incomplete  Culture, blood (routine x 2)     Status: None (Preliminary result)   Collection Time: 09/08/15  9:01 PM  Result Value Ref Range Status   Specimen Description BLOOD RIGHT HAND  Final   Special Requests BOTTLES DRAWN AEROBIC AND ANAEROBIC 5CC  Final   Culture  Setup Time   Final    GRAM POSITIVE COCCI IN CHAINS IN PAIRS ANAEROBIC BOTTLE ONLY CRITICAL RESULT CALLED TO, READ BACK BY AND VERIFIED WITH: Storm Frisk, RN AT 1059 ON [id number] BY Rhea Bleacher    Culture GRAM POSITIVE COCCI  Final   Report Status PENDING  Incomplete  Urine culture     Status: None (Preliminary result)   Collection Time: 09/08/15  9:30 PM  Result Value Ref Range Status   Specimen Description URINE, CLEAN CATCH  Final   Special Requests NONE  Final   Culture TOO YOUNG TO READ  Final   Report Status PENDING  Incomplete  MRSA PCR Screening     Status: None   Collection Time: 09/09/15  3:00 PM  Result Value Ref Range Status   MRSA by PCR NEGATIVE NEGATIVE Final    Comment:        The GeneXpert MRSA Assay  (FDA approved for NASAL specimens only), is one component of a comprehensive MRSA colonization surveillance program. It is not intended to diagnose MRSA infection nor to guide or monitor treatment for MRSA infections.     Radiology Reports Dg Chest Portable 1 View  09/08/2015   CLINICAL DATA:  Initial evaluation for acute chest pain, cough.  EXAM: PORTABLE CHEST - 1 VIEW  COMPARISON:  Prior radiograph from 06/28/2015.  FINDINGS: Patient is rotated to the right. Allowing for rotation, cardiac and mediastinal silhouettes are stable in size and contour, and remain within normal limits.  Lungs are mildly hypoinflated. Linear opacity right lung base most compatible with scarring, stable. Mild diffuse prominence of the interstitial markings is not significantly changed. There is slightly more prominent patchy bibasilar opacities as compared to prior study, which may reflect vascular crowding or possibly developing infectious infiltrates. Mid and upper lungs are clear. No pulmonary edema. No definite pleural effusion on this limited single view of the chest. No pneumothorax.  No acute osseus abnormality.  Surgical clips overlie the left neck.  IMPRESSION: 1. Mild bibasilar patchy opacities, which may reflect vascular crowding/atelectasis or possibly developing infectious infiltrates. 2. Stable linear scarring at the right lung base.   Electronically Signed   By: Jeannine Boga M.D.   On: 09/08/2015 22:08     CBC  Recent Labs Lab 09/08/15 2102 09/09/15 0427 09/10/15 0235  WBC 16.2* 22.7* 14.7*  HGB 13.0 10.9*  10.9*  HCT 38.7* 32.6* 33.3*  PLT 207 157 143*  MCV 88.4 88.3 89.0  MCH 29.7 29.5 29.1  MCHC 33.6 33.4 32.7  RDW 14.4 14.8 15.0  LYMPHSABS 0.6*  --   --   MONOABS 0.6  --   --   EOSABS 0.0  --   --   BASOSABS 0.0  --   --     Chemistries   Recent Labs Lab 09/08/15 2102 09/09/15 0427 09/10/15 0235  NA 138 136 138  K 4.0 4.1 3.3*  CL 105 107 107  CO2 25 20* 23   GLUCOSE 143* 150* 123*  BUN 15 15 12   CREATININE 1.01 0.89 0.92  CALCIUM 9.4 7.5* 8.1*  AST 23  --   --   ALT 17  --   --   ALKPHOS 52  --   --   BILITOT 1.3*  --   --    ------------------------------------------------------------------------------------------------------------------ estimated creatinine clearance is 90.9 mL/min (by C-G formula based on Cr of 0.92). ------------------------------------------------------------------------------------------------------------------ No results for input(s): HGBA1C in the last 72 hours. ------------------------------------------------------------------------------------------------------------------ No results for input(s): CHOL, HDL, LDLCALC, TRIG, CHOLHDL, LDLDIRECT in the last 72 hours. ------------------------------------------------------------------------------------------------------------------ No results for input(s): TSH, T4TOTAL, T3FREE, THYROIDAB in the last 72 hours.  Invalid input(s): FREET3 ------------------------------------------------------------------------------------------------------------------ No results for input(s): VITAMINB12, FOLATE, FERRITIN, TIBC, IRON, RETICCTPCT in the last 72 hours.  Coagulation profile  Recent Labs Lab 09/09/15 0033  INR 1.22    No results for input(s): DDIMER in the last 72 hours.  Cardiac Enzymes No results for input(s): CKMB, TROPONINI, MYOGLOBIN in the last 168 hours.  Invalid input(s): CK ------------------------------------------------------------------------------------------------------------------ Invalid input(s): POCBNP   Time Spent in minutes  35   Lala Lund K M.D on 09/10/2015 at 9:50 AM  Between 7am to 7pm - Pager - [phone]  After 7pm go to [email or web address] - password [facility]  [facility] -  Office  [phone]

## 2015-09-10 NOTE — Progress Notes (Signed)
Pt desat on the NIV, due to shallow respirations 86-89% on 3L o2 bleeded into the CPAP nasal pillows,  but ive noticed once patient takes a deep breath intermittently while he sleeps his o2 saturation is stable 92-93%.I moved his oxygen up 1L to help aid in the shallow breathing. RN aware. Pt is comfortable resting well.

## 2015-09-10 NOTE — Progress Notes (Signed)
Utilization review completed.  

## 2015-09-11 ENCOUNTER — Inpatient Hospital Stay (HOSPITAL_COMMUNITY): Payer: Medicare Other

## 2015-09-11 DIAGNOSIS — A403 Sepsis due to Streptococcus pneumoniae: Secondary | ICD-10-CM

## 2015-09-11 DIAGNOSIS — I509 Heart failure, unspecified: Secondary | ICD-10-CM

## 2015-09-11 LAB — LEGIONELLA ANTIGEN, URINE

## 2015-09-11 LAB — CULTURE, BLOOD (ROUTINE X 2)

## 2015-09-11 LAB — BASIC METABOLIC PANEL
Anion gap: 7 (ref 5–15)
BUN: 10 mg/dL (ref 6–20)
CO2: 27 mmol/L (ref 22–32)
Calcium: 8 mg/dL — ABNORMAL LOW (ref 8.9–10.3)
Chloride: 103 mmol/L (ref 101–111)
Creatinine, Ser: 0.86 mg/dL (ref 0.61–1.24)
GFR calc Af Amer: >60 mL/min (ref >60)
GFR calc non Af Amer: >60 mL/min (ref >60)
Glucose, Bld: 150 mg/dL — ABNORMAL HIGH (ref 65–99)
Potassium: 4.1 mmol/L (ref 3.5–5.1)
Sodium: 137 mmol/L (ref 135–145)

## 2015-09-11 LAB — GLUCOSE, CAPILLARY
Glucose-Capillary: 135 mg/dL — ABNORMAL HIGH (ref 65–99)
Glucose-Capillary: 170 mg/dL — ABNORMAL HIGH (ref 65–99)
Glucose-Capillary: 208 mg/dL — ABNORMAL HIGH (ref 65–99)
Glucose-Capillary: 204 mg/dL — ABNORMAL HIGH (ref 65–99)

## 2015-09-11 LAB — MAGNESIUM: Magnesium: 1.7 mg/dL (ref 1.7–2.4)

## 2015-09-11 LAB — HEMOGLOBIN A1C
Hgb A1c MFr Bld: 6.9 % — ABNORMAL HIGH (ref 4.8–5.6)
Mean Plasma Glucose: 151 mg/dL

## 2015-09-11 MED ORDER — MAGNESIUM SULFATE IN D5W 10-5 MG/ML-% IV SOLN
1.0000 g | Freq: Once | INTRAVENOUS | Status: AC
  Administered 2015-09-11: 1 g via INTRAVENOUS
  Filled 2015-09-11: qty 100

## 2015-09-11 MED ORDER — DEXTROSE 5 % IV SOLN
2.0000 g | INTRAVENOUS | Status: DC
  Filled 2015-09-11: qty 2

## 2015-09-11 MED ORDER — LEVOFLOXACIN IN D5W 750 MG/150ML IV SOLN
750.0000 mg | INTRAVENOUS | Status: DC
  Administered 2015-09-11: 750 mg via INTRAVENOUS
  Filled 2015-09-11: qty 150

## 2015-09-11 NOTE — Progress Notes (Signed)
Patient Demographics:    [name], is a 74 y.o. male, DOB - Sep 04, 1941, [id number]  Admit date - 09/08/2015   Admitting Physician Theressa Millard, MD  Outpatient Primary MD for the patient is [name],[name] [name], MD  LOS - 3   Chief Complaint  Patient presents with  . Code Sepsis        Subjective:    [name] today has, No headache, No chest pain, No abdominal pain - No Nausea, No new weakness tingling or numbness, much improved Cough - SOB.     Assessment  & Plan :     1. Acute hypoxic respiratory failure due to community-acquired pneumonia with early sepsis. Sepsis much improved, blood pressures are stable, lactic acid borderline, blood cultures have been positive within a few hours, monitor final culture and sensitivity, blood cultures so far growing strep. Repeat chest x-ray stable. Clinically defervesced on Vanco-Rocephin and azithromycin. We'll taper down to Rocephin and Levaquin on 09/11/2015. Discussed over the phone with Dr. Megan Salon ID. Total of 10-14 days once culture sensitivity final of either oral amoxicillin or Levaquin.    2. 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 Improved with IV Lasix will be continued. BNP was high, on beta blocker continue, echogram pending.   3. CAD. Status post stent placement. On aspirin and statin as secondary prevention which will be continued, has been placed on low-dose beta blocker .   4. Hypertension. Blood pressure stable on Lasix and beta blocker, home dose Ace and Norvasc will be resumed if remains stable.   5.OSA - CPAP WHILE sleeping.   6. Dyslipidemia. Placed on statin.   7. GERD. On PPI continue.    8. DM type II. Hold oral hypoglycemics along with Glucophage, placed on sliding scale  insulin.   Lab Results  Component Value Date   HGBA1C 7.2* 07/17/2015    CBG (last 3)   Recent Labs  09/10/15 1604 09/10/15 2045 09/11/15 0512  GLUCAP 205* 185* 135*      Code Status : Full  Family Communication  : Son bedside  Disposition Plan  : Home tomorrow  Consults  :  None  Procedures  :    Echogram  DVT Prophylaxis  :  Lovenox   Lab Results  Component Value Date   PLT 143* 09/10/2015    Inpatient Medications  Scheduled Meds: . albuterol  2.5 mg Nebulization Once  . antiseptic oral rinse  7 mL Mouth Rinse BID  . aspirin  325 mg Oral Daily  . atorvastatin  40 mg Oral q1800  . cefTRIAXone (ROCEPHIN)  IV  2 g Intravenous Q24H  . enoxaparin (LOVENOX) injection  40 mg Subcutaneous Q24H  . furosemide  40 mg Intravenous BID  . insulin aspart  0-15 Units Subcutaneous TID WC  . insulin aspart  0-5 Units Subcutaneous QHS  . insulin aspart protamine- aspart  25 Units Subcutaneous QHS  . magnesium sulfate 1 - 4 g bolus IVPB  1 g Intravenous Once  . metoprolol tartrate  50 mg Oral BID  . pantoprazole  40 mg Oral Daily   Continuous Infusions:   PRN Meds:.acetaminophen **OR** [DISCONTINUED] acetaminophen, albuterol, alum & mag hydroxide-simeth, Influenza vac split quadrivalent PF, [DISCONTINUED]  ondansetron **OR** ondansetron (ZOFRAN) IV, oxyCODONE, sodium chloride  Antibiotics  :     Anti-infectives    Start     Dose/Rate Route Frequency Ordered Stop   09/11/15 2200  cefTRIAXone (ROCEPHIN) 2 g in dextrose 5 % 50 mL IVPB     2 g 100 mL/hr over 30 Minutes Intravenous Every 24 hours 09/11/15 0909     09/10/15 2200  cefTRIAXone (ROCEPHIN) 1 g in dextrose 5 % 50 mL IVPB  Status:  Discontinued     1 g 100 mL/hr over 30 Minutes Intravenous  Once 09/09/15 0014 09/09/15 0023   09/10/15 2200  azithromycin (ZITHROMAX) 500 mg in dextrose 5 % 250 mL IVPB  Status:  Discontinued     500 mg 250 mL/hr over 60 Minutes Intravenous  Once 09/09/15 0014 09/09/15 0023    09/10/15 0100  vancomycin (VANCOCIN) IVPB 1000 mg/200 mL premix  Status:  Discontinued     1,000 mg 200 mL/hr over 60 Minutes Intravenous Every 12 hours 09/09/15 1142 09/11/15 0952   09/09/15 2230  azithromycin (ZITHROMAX) 500 mg in dextrose 5 % 250 mL IVPB  Status:  Discontinued     500 mg 250 mL/hr over 60 Minutes Intravenous Every 24 hours 09/08/15 2225 09/11/15 0956   09/09/15 2200  cefTRIAXone (ROCEPHIN) 1 g in dextrose 5 % 50 mL IVPB  Status:  Discontinued     1 g 100 mL/hr over 30 Minutes Intravenous Every 24 hours 09/08/15 2224 09/11/15 0909   09/09/15 1145  vancomycin (VANCOCIN) 2,000 mg in sodium chloride 0.9 % 500 mL IVPB  Status:  Discontinued     2,000 mg 250 mL/hr over 120 Minutes Intravenous  Once 09/09/15 1131 09/10/15 1355   09/09/15 1115  cefTRIAXone (ROCEPHIN) 1 g in dextrose 5 % 50 mL IVPB  Status:  Discontinued     1 g 100 mL/hr over 30 Minutes Intravenous Every 24 hours 09/09/15 1101 09/09/15 1102   09/09/15 1115  azithromycin (ZITHROMAX) 500 mg in dextrose 5 % 250 mL IVPB  Status:  Discontinued     500 mg 250 mL/hr over 60 Minutes Intravenous Every 24 hours 09/09/15 1101 09/09/15 1102   09/08/15 2215  cefTRIAXone (ROCEPHIN) 1 g in dextrose 5 % 50 mL IVPB     1 g 100 mL/hr over 30 Minutes Intravenous  Once 09/08/15 2213 09/08/15 2307   09/08/15 2215  azithromycin (ZITHROMAX) 500 mg in dextrose 5 % 250 mL IVPB     500 mg 250 mL/hr over 60 Minutes Intravenous  Once 09/08/15 2213 09/09/15 0435        Objective:   Filed Vitals:   09/11/15 0040 09/11/15 0233 09/11/15 0507 09/11/15 0750  BP:  126/83 151/49 138/57  Pulse: 81 70 80 79  Temp:  98.6 F (37 C) 97.7 F (36.5 C) 98.3 F (36.8 C)  TempSrc:  Oral Oral Oral  Resp: 16 18 20 18   Height:      Weight:   121.201 kg (267 lb 3.2 oz)   SpO2: 94% 93% 91% 94%    Wt Readings from Last 3 Encounters:  09/11/15 121.201 kg (267 lb 3.2 oz)  06/28/15 124.286 kg (274 lb)  04/26/15 122.607 kg (270 lb 4.8 oz)      Intake/Output Summary (Last 24 hours) at 09/11/15 0957 Last data filed at 09/11/15 0936  Gross per 24 hour  Intake    910 ml  Output   4775 ml  Net  -3865 ml  Physical Exam  Awake Alert, Oriented X 3, No new F.N deficits, Normal affect [place].AT,PERRAL Supple Neck,No JVD, No cervical lymphadenopathy appriciated.  Symmetrical Chest wall movement, Good air movement bilaterally, few rales RRR,No Gallops,Rubs or new Murmurs, No Parasternal Heave +ve B.Sounds, Abd Soft, No tenderness, No organomegaly appriciated, No rebound - guarding or rigidity. No Cyanosis, Clubbing , 1+ edema, No new Rash or bruise      Data Review:   Micro Results Recent Results (from the past 240 hour(s))  Culture, blood (routine x 2)     Status: None (Preliminary result)   Collection Time: 09/08/15  8:50 PM  Result Value Ref Range Status   Specimen Description BLOOD RIGHT ARM  Final   Special Requests BOTTLES DRAWN AEROBIC AND ANAEROBIC 5CC  Final   Culture  Setup Time   Final    GRAM POSITIVE COCCI IN CHAINS IN PAIRS IN BOTH AEROBIC AND ANAEROBIC BOTTLES CRITICAL RESULT CALLED TO, READ BACK BY AND VERIFIED WITH: S. SNIDER,RN AT 1059 ON [id number] BY S. YARBROUGH    Culture GROUP B STREP(S.AGALACTIAE)ISOLATED  Final   Report Status PENDING  Incomplete  Culture, blood (routine x 2)     Status: None (Preliminary result)   Collection Time: 09/08/15  9:01 PM  Result Value Ref Range Status   Specimen Description BLOOD RIGHT HAND  Final   Special Requests BOTTLES DRAWN AEROBIC AND ANAEROBIC 5CC  Final   Culture  Setup Time   Final    GRAM POSITIVE COCCI IN CHAINS IN PAIRS ANAEROBIC BOTTLE ONLY CRITICAL RESULT CALLED TO, READ BACK BY AND VERIFIED WITH: Storm Frisk, RN AT 1059 ON [id number] BY Rhea Bleacher    Culture   Final    GROUP B STREP(S.AGALACTIAE)ISOLATED SUSCEPTIBILITIES TO FOLLOW    Report Status PENDING  Incomplete  Urine culture     Status: None   Collection Time: 09/08/15  9:30 PM  Result  Value Ref Range Status   Specimen Description URINE, CLEAN CATCH  Final   Special Requests NONE  Final   Culture 30,000 COLONIES/mL CITROBACTER KOSERI  Final   Report Status 09/10/2015 FINAL  Final   Organism ID, Bacteria CITROBACTER KOSERI  Final      Susceptibility   Citrobacter koseri - MIC*    CEFAZOLIN <=4 SENSITIVE Sensitive     CEFTRIAXONE <=1 SENSITIVE Sensitive     CIPROFLOXACIN <=0.25 SENSITIVE Sensitive     GENTAMICIN <=1 SENSITIVE Sensitive     IMIPENEM <=0.25 SENSITIVE Sensitive     NITROFURANTOIN <=16 SENSITIVE Sensitive     TRIMETH/SULFA <=20 SENSITIVE Sensitive     PIP/TAZO <=4 SENSITIVE Sensitive     * 30,000 COLONIES/mL CITROBACTER KOSERI  MRSA PCR Screening     Status: None   Collection Time: 09/09/15  3:00 PM  Result Value Ref Range Status   MRSA by PCR NEGATIVE NEGATIVE Final    Comment:        The GeneXpert MRSA Assay (FDA approved for NASAL specimens only), is one component of a comprehensive MRSA colonization surveillance program. It is not intended to diagnose MRSA infection nor to guide or monitor treatment for MRSA infections.     Radiology Reports Dg Chest 2 View  09/10/2015   CLINICAL DATA:  Shortness of breath, pneumonia  EXAM: CHEST  2 VIEW  COMPARISON:  09/08/2015  FINDINGS: Improved bibasilar aeration with persistent patchy left lower lobe airspace opacity. Thin linear right lower lobe scarring or atelectasis reidentified. Small pleural effusions. Heart size normal.  IMPRESSION:  Improved bibasilar aeration with incomplete resolution of left greater than right airspace opacities as described above.   Electronically Signed   By: Conchita Paris M.D.   On: 09/10/2015 12:19   Dg Chest Portable 1 View  09/08/2015   CLINICAL DATA:  Initial evaluation for acute chest pain, cough.  EXAM: PORTABLE CHEST - 1 VIEW  COMPARISON:  Prior radiograph from 06/28/2015.  FINDINGS: Patient is rotated to the right. Allowing for rotation, cardiac and mediastinal  silhouettes are stable in size and contour, and remain within normal limits.  Lungs are mildly hypoinflated. Linear opacity right lung base most compatible with scarring, stable. Mild diffuse prominence of the interstitial markings is not significantly changed. There is slightly more prominent patchy bibasilar opacities as compared to prior study, which may reflect vascular crowding or possibly developing infectious infiltrates. Mid and upper lungs are clear. No pulmonary edema. No definite pleural effusion on this limited single view of the chest. No pneumothorax.  No acute osseus abnormality.  Surgical clips overlie the left neck.  IMPRESSION: 1. Mild bibasilar patchy opacities, which may reflect vascular crowding/atelectasis or possibly developing infectious infiltrates. 2. Stable linear scarring at the right lung base.   Electronically Signed   By: Jeannine Boga M.D.   On: 09/08/2015 22:08     CBC  Recent Labs Lab 09/08/15 2102 09/09/15 0427 09/10/15 0235  WBC 16.2* 22.7* 14.7*  HGB 13.0 10.9* 10.9*  HCT 38.7* 32.6* 33.3*  PLT 207 157 143*  MCV 88.4 88.3 89.0  MCH 29.7 29.5 29.1  MCHC 33.6 33.4 32.7  RDW 14.4 14.8 15.0  LYMPHSABS 0.6*  --   --   MONOABS 0.6  --   --   EOSABS 0.0  --   --   BASOSABS 0.0  --   --     Chemistries   Recent Labs Lab 09/08/15 2102 09/09/15 0427 09/10/15 0235 09/11/15 0336  NA 138 136 138 137  K 4.0 4.1 3.3* 4.1  CL 105 107 107 103  CO2 25 20* 23 27  GLUCOSE 143* 150* 123* 150*  BUN 15 15 12 10   CREATININE 1.01 0.89 0.92 0.86  CALCIUM 9.4 7.5* 8.1* 8.0*  MG  --   --   --  1.7  AST 23  --   --   --   ALT 17  --   --   --   ALKPHOS 52  --   --   --   BILITOT 1.3*  --   --   --    ------------------------------------------------------------------------------------------------------------------ estimated creatinine clearance is 97.6 mL/min (by C-G formula based on Cr of  0.86). ------------------------------------------------------------------------------------------------------------------ No results for input(s): HGBA1C in the last 72 hours. ------------------------------------------------------------------------------------------------------------------ No results for input(s): CHOL, HDL, LDLCALC, TRIG, CHOLHDL, LDLDIRECT in the last 72 hours. ------------------------------------------------------------------------------------------------------------------ No results for input(s): TSH, T4TOTAL, T3FREE, THYROIDAB in the last 72 hours.  Invalid input(s): FREET3 ------------------------------------------------------------------------------------------------------------------ No results for input(s): VITAMINB12, FOLATE, FERRITIN, TIBC, IRON, RETICCTPCT in the last 72 hours.  Coagulation profile  Recent Labs Lab 09/09/15 0033  INR 1.22    No results for input(s): DDIMER in the last 72 hours.  Cardiac Enzymes No results for input(s): CKMB, TROPONINI, MYOGLOBIN in the last 168 hours.  Invalid input(s): CK ------------------------------------------------------------------------------------------------------------------ Invalid input(s): POCBNP   Time Spent in minutes  35   Lala Lund K M.D on 09/11/2015 at 9:57 AM  Between 7am to 7pm - Pager - [phone]  After 7pm go to [email or web address] - password [facility]  [facility] -  Office  [phone]

## 2015-09-11 NOTE — Progress Notes (Addendum)
ANTIBIOTIC CONSULT NOTE - FOLLOW UP  Pharmacy Consult for Vancomycin, rocephin Indication: PNA / bacteremia  Allergies  Allergen Reactions  . Clopidogrel Bisulfate Itching and Rash    REACTION: rash, itching from Head to Toe   -  PLAVIX   . Adhesive [Tape] Rash    rash    Patient Measurements: Height: 5' 9.5" (176.5 cm) Weight: 267 lb 3.2 oz (121.201 kg) (Scale B) IBW/kg (Calculated) : 71.85  Vital Signs: Temp: 98.3 F (36.8 C) (09/12 0750) Temp Source: Oral (09/12 0750) BP: 138/57 mmHg (09/12 0750) Pulse Rate: 79 (09/12 0750) Intake/Output from previous day: 09/11 0701 - 09/12 0700 In: 670 [P.O.:420; IV Piggyback:250] Out: 4775 [Urine:4775] Intake/Output from this shift:    Labs:  Recent Labs  09/08/15 2102 09/09/15 0427 09/10/15 0235 09/11/15 0336  WBC 16.2* 22.7* 14.7*  --   HGB 13.0 10.9* 10.9*  --   PLT 207 157 143*  --   CREATININE 1.01 0.89 0.92 0.86   Estimated Creatinine Clearance: 97.6 mL/min (by C-G formula based on Cr of 0.86). No results for input(s): VANCOTROUGH, VANCOPEAK, VANCORANDOM, GENTTROUGH, GENTPEAK, GENTRANDOM, TOBRATROUGH, TOBRAPEAK, TOBRARND, AMIKACINPEAK, AMIKACINTROU, AMIKACIN in the last 72 hours.   Microbiology: Recent Results (from the past 720 hour(s))  Culture, blood (routine x 2)     Status: None (Preliminary result)   Collection Time: 09/08/15  8:50 PM  Result Value Ref Range Status   Specimen Description BLOOD RIGHT ARM  Final   Special Requests BOTTLES DRAWN AEROBIC AND ANAEROBIC 5CC  Final   Culture  Setup Time   Final    GRAM POSITIVE COCCI IN CHAINS IN PAIRS IN BOTH AEROBIC AND ANAEROBIC BOTTLES CRITICAL RESULT CALLED TO, READ BACK BY AND VERIFIED WITH: S. SNIDER,RN AT 1059 ON [id number] BY S. YARBROUGH    Culture GROUP B STREP(S.AGALACTIAE)ISOLATED  Final   Report Status PENDING  Incomplete  Culture, blood (routine x 2)     Status: None (Preliminary result)   Collection Time: 09/08/15  9:01 PM  Result Value Ref Range  Status   Specimen Description BLOOD RIGHT HAND  Final   Special Requests BOTTLES DRAWN AEROBIC AND ANAEROBIC 5CC  Final   Culture  Setup Time   Final    GRAM POSITIVE COCCI IN CHAINS IN PAIRS ANAEROBIC BOTTLE ONLY CRITICAL RESULT CALLED TO, READ BACK BY AND VERIFIED WITH: Storm Frisk, RN AT 1059 ON [id number] BY Rhea Bleacher    Culture   Final    GROUP B STREP(S.AGALACTIAE)ISOLATED SUSCEPTIBILITIES TO FOLLOW    Report Status PENDING  Incomplete  Urine culture     Status: None   Collection Time: 09/08/15  9:30 PM  Result Value Ref Range Status   Specimen Description URINE, CLEAN CATCH  Final   Special Requests NONE  Final   Culture 30,000 COLONIES/mL CITROBACTER KOSERI  Final   Report Status 09/10/2015 FINAL  Final   Organism ID, Bacteria CITROBACTER KOSERI  Final      Susceptibility   Citrobacter koseri - MIC*    CEFAZOLIN <=4 SENSITIVE Sensitive     CEFTRIAXONE <=1 SENSITIVE Sensitive     CIPROFLOXACIN <=0.25 SENSITIVE Sensitive     GENTAMICIN <=1 SENSITIVE Sensitive     IMIPENEM <=0.25 SENSITIVE Sensitive     NITROFURANTOIN <=16 SENSITIVE Sensitive     TRIMETH/SULFA <=20 SENSITIVE Sensitive     PIP/TAZO <=4 SENSITIVE Sensitive     * 30,000 COLONIES/mL CITROBACTER KOSERI  MRSA PCR Screening     Status: None  Collection Time: 09/09/15  3:00 PM  Result Value Ref Range Status   MRSA by PCR NEGATIVE NEGATIVE Final    Comment:        The GeneXpert MRSA Assay (FDA approved for NASAL specimens only), is one component of a comprehensive MRSA colonization surveillance program. It is not intended to diagnose MRSA infection nor to guide or monitor treatment for MRSA infections.     Anti-infectives    Start     Dose/Rate Route Frequency Ordered Stop   09/10/15 2200  cefTRIAXone (ROCEPHIN) 1 g in dextrose 5 % 50 mL IVPB  Status:  Discontinued     1 g 100 mL/hr over 30 Minutes Intravenous  Once 09/09/15 0014 09/09/15 0023   09/10/15 2200  azithromycin (ZITHROMAX) 500 mg in  dextrose 5 % 250 mL IVPB  Status:  Discontinued     500 mg 250 mL/hr over 60 Minutes Intravenous  Once 09/09/15 0014 09/09/15 0023   09/10/15 0100  vancomycin (VANCOCIN) IVPB 1000 mg/200 mL premix     1,000 mg 200 mL/hr over 60 Minutes Intravenous Every 12 hours 09/09/15 1142     09/09/15 2230  azithromycin (ZITHROMAX) 500 mg in dextrose 5 % 250 mL IVPB     500 mg 250 mL/hr over 60 Minutes Intravenous Every 24 hours 09/08/15 2225     09/09/15 2200  cefTRIAXone (ROCEPHIN) 1 g in dextrose 5 % 50 mL IVPB     1 g 100 mL/hr over 30 Minutes Intravenous Every 24 hours 09/08/15 2224     09/09/15 1145  vancomycin (VANCOCIN) 2,000 mg in sodium chloride 0.9 % 500 mL IVPB  Status:  Discontinued     2,000 mg 250 mL/hr over 120 Minutes Intravenous  Once 09/09/15 1131 09/10/15 1355   09/09/15 1115  cefTRIAXone (ROCEPHIN) 1 g in dextrose 5 % 50 mL IVPB  Status:  Discontinued     1 g 100 mL/hr over 30 Minutes Intravenous Every 24 hours 09/09/15 1101 09/09/15 1102   09/09/15 1115  azithromycin (ZITHROMAX) 500 mg in dextrose 5 % 250 mL IVPB  Status:  Discontinued     500 mg 250 mL/hr over 60 Minutes Intravenous Every 24 hours 09/09/15 1101 09/09/15 1102   09/08/15 2215  cefTRIAXone (ROCEPHIN) 1 g in dextrose 5 % 50 mL IVPB     1 g 100 mL/hr over 30 Minutes Intravenous  Once 09/08/15 2213 09/08/15 2307   09/08/15 2215  azithromycin (ZITHROMAX) 500 mg in dextrose 5 % 250 mL IVPB     500 mg 250 mL/hr over 60 Minutes Intravenous  Once 09/08/15 2213 09/09/15 0435      Assessment: 74 yo M presents on 9/9 with fever, chills, and cough. CXR showed bibasilar opacities and abx were started for CAP. Now to add vancomycin per pharmacy as blood cx's came back 2/2 of GPC in chains and pairs. Afebrile, WBC 14.7 (down). SCr 0.86, normalized CrCl ~75ml/min.  9/9 Blood cx > GBS (S. Agalac) 9/9 Urine cx > 30 k of citrobacter (pan sensitive)  9/9 Ceftriaxone >> 9/9 Azithromycin >>9/12 9/10 Vancomycin  >>9/12  Antibiotics narrowed to ceftriaxone and levaquin this am, will await final sensitivities prior to narrowing to monotherapy with amox or levaquin.  Goal of Therapy:  Vancomycin trough level 15-20 mcg/ml  Resolution of infection  Plan:  -d/c vancomycin  -Continue ceftriaxone to  2g IV Q24 per MD for now -Will follow renal function, cultures and clinical progress  Erin Hearing PharmD., BCPS Clinical Pharmacist  Pager [phone] 09/11/2015 12:35 PM

## 2015-09-11 NOTE — Progress Notes (Addendum)
Inpatient Diabetes Program Recommendations  AACE/ADA: New Consensus Statement on Inpatient Glycemic Control (2013)  Target Ranges:  Prepandial:   less than 140 mg/dL      Peak postprandial:   less than 180 mg/dL (1-2 hours)      Critically ill patients:  140 - 180 mg/dL   Review of Glycemic control:  Results for BANE, CRUCES (MRN [id number]) as of 09/11/2015 13:14  Ref. Range 09/10/2015 12:59 09/10/2015 16:04 09/10/2015 20:45 09/11/2015 05:12 09/11/2015 11:10  Glucose-Capillary Latest Ref Range: 65-99 mg/dL 139 (H) 205 (H) 185 (H) 135 (H) 170 (H)    Outpatient Diabetes medications: Novolog 70/30 25 units q PM (note per medication reconciliation patient takes at bedtime) Current orders for Inpatient glycemic control:  Novolog moderate tid with meals, Novolog 70/30 25 units q HS  Please consider changing Novolog 70/30 to be given with evening meal. Note that 70/30 is routinely given with meal and per PCP's notes patient was advised to take the Novolog 70/30 with supper.  Will follow.  Thanks, Adah Perl, RN, BC-ADM Inpatient Diabetes Coordinator Pager [phone] (8a-5p)

## 2015-09-11 NOTE — Evaluation (Signed)
Physical Therapy Evaluation Patient Details Name: [name] MRN: [id number] DOB: June 14, 1941 Today's Date: 09/11/2015   History of Present Illness  [name] is a 74 y.o. male with a history of CVA x 2, CAD, HTN, DM-1, Hyperlipidemia who presents to the ED with complaints of Fevers and Chills and Non-Productive Cough x 2 days. He report having fever to 105.3 at home today. He was evaluated in the ED and found to have bibasilar opacities on chest X-ray, along with an elevated lactic Acid at 2.59.  Clinical Impression  Patient evaluated by Physical Therapy with no further acute PT needs identified. All education has been completed and the patient has no further questions. Pt mod I with mobility though limited in distance by back pain, discussed management of this at home.  See below for any follow-up Physial Therapy or equipment needs. PT is signing off. Thank you for this referral.    Follow Up Recommendations No PT follow up    Equipment Recommendations  None recommended by PT    Recommendations for Other Services       Precautions / Restrictions Precautions Precautions: None Restrictions Weight Bearing Restrictions: No      Mobility  Bed Mobility Overal bed mobility: Independent                Transfers Overall transfer level: Independent                  Ambulation/Gait Ambulation/Gait assistance: Modified independent (Device/Increase time) Ambulation Distance (Feet): 300 Feet Assistive device: Rolling walker (2 wheeled);None Gait Pattern/deviations: Step-through pattern;Trunk flexed Gait velocity: WFL Gait velocity interpretation: at or above normal speed for age/gender General Gait Details: pt maintain trunk flexion during ambulation due to back pain. Began without AD and no LOB noted, pt began to have increased back pain after 200' and required use of RW for management.   Stairs            Wheelchair Mobility     Modified Rankin (Stroke Patients Only)       Balance Overall balance assessment: No apparent balance deficits (not formally assessed)                                           Pertinent Vitals/Pain Pain Assessment: Faces Faces Pain Scale: Hurts even more Pain Location: low back and left leg with ambulation Pain Descriptors / Indicators: Aching Pain Intervention(s): Limited activity within patient's tolerance;Monitored during session;Other (comment) (used RW)         O2 sats 95%, HR 91 bpm after ambulation, 2/4 [place] expects to be discharged to:: Private residence Living Arrangements: Spouse/significant other Available Help at Discharge: Family;Available PRN/intermittently Type of Home: House Home Access: Ramped entrance     Home Layout: One level Home Equipment: Cane - single point Additional Comments: pt's wife uses a RW in the home and w/c outside of home due to osteoprosis and multiple hip fractures. Children are supportive and help out as needed    Prior Function Level of Independence: Independent with assistive device(s)         Comments: pt uses cane for longer distances due to back pain and still drives and works in [employer]        Extremity/Trunk Assessment   Upper Extremity Assessment: Overall WFL for tasks assessed  Lower Extremity Assessment: Overall WFL for tasks assessed      Cervical / Trunk Assessment: Kyphotic  Communication   Communication: No difficulties  Cognition Arousal/Alertness: Awake/alert Behavior During Therapy: WFL for tasks assessed/performed Overall Cognitive Status: Within Functional Limits for tasks assessed                      General Comments General comments (skin integrity, edema, etc.): pt has been to outpt PT for mgmt of his back pain that began several months ago. Symptoms consistent with stenosis. Pt reports that therapy  helped and encouraged to continue his exercises at home.    Exercises        Assessment/Plan    PT Assessment Patent does not need any further PT services  PT Diagnosis Difficulty walking   PT Problem List    PT Treatment Interventions     PT Goals (Current goals can be found in the Care Plan section) Acute Rehab PT Goals Patient Stated Goal: return home PT Goal Formulation: All assessment and education complete, DC therapy    Frequency     Barriers to discharge        Co-evaluation               End of Session Equipment Utilized During Treatment: Gait belt Activity Tolerance: Patient tolerated treatment well Patient left: in bed;with call bell/phone within reach Nurse Communication: Mobility status         Time: [id number] PT Time Calculation (min) (ACUTE ONLY): 25 min   Charges:   PT Evaluation $Initial PT Evaluation Tier I: 1 Procedure PT Treatments $Gait Training: 8-22 mins   PT G Codes:       Leighton Roach, PT  Acute Rehab Services  [phone]  Leighton Roach 09/11/2015, 12:54 PM

## 2015-09-11 NOTE — Progress Notes (Signed)
  Echocardiogram 2D Echocardiogram has been performed.  [name] 09/11/2015, 3:32 PM

## 2015-09-11 NOTE — Progress Notes (Signed)
Pt is on HOME CPAP at setting of 5 at this time. Pt tolerating his nasal pillows well. Pt is stable at this time with no complications noted. Pt o2 sats are acceptable 94% RA.

## 2015-09-11 NOTE — Care Management Important Message (Signed)
Important Message  Patient Details  Name: [name] MRN: [id number] Date of Birth: 09/18/1941   Medicare Important Message Given:  Yes-second notification given    Loann Quill 09/11/2015, 12:50 PM

## 2015-09-12 DIAGNOSIS — D72829 Elevated white blood cell count, unspecified: Secondary | ICD-10-CM

## 2015-09-12 LAB — GLUCOSE, CAPILLARY
Glucose-Capillary: 138 mg/dL — ABNORMAL HIGH (ref 65–99)
Glucose-Capillary: 189 mg/dL — ABNORMAL HIGH (ref 65–99)

## 2015-09-12 LAB — CBC
HCT: 34.6 % — ABNORMAL LOW (ref 39.0–52.0)
Hemoglobin: 11.4 g/dL — ABNORMAL LOW (ref 13.0–17.0)
MCH: 28.8 pg (ref 26.0–34.0)
MCHC: 32.9 g/dL (ref 30.0–36.0)
MCV: 87.4 fL (ref 78.0–100.0)
Platelets: 163 10*3/uL (ref 150–400)
RBC: 3.96 MIL/uL — ABNORMAL LOW (ref 4.22–5.81)
RDW: 14.6 % (ref 11.5–15.5)
WBC: 8 10*3/uL (ref 4.0–10.5)

## 2015-09-12 LAB — CULTURE, BLOOD (ROUTINE X 2)

## 2015-09-12 LAB — POTASSIUM: Potassium: 3.4 mmol/L — ABNORMAL LOW (ref 3.5–5.1)

## 2015-09-12 LAB — MAGNESIUM: Magnesium: 2.1 mg/dL (ref 1.7–2.4)

## 2015-09-12 MED ORDER — LEVOFLOXACIN 750 MG PO TABS: 750.0000 mg | ORAL_TABLET | Freq: Every day | ORAL | Status: DC

## 2015-09-12 MED ORDER — FUROSEMIDE 40 MG PO TABS: 40.0000 mg | ORAL_TABLET | Freq: Two times a day (BID) | ORAL | Status: DC

## 2015-09-12 MED ORDER — FUROSEMIDE 40 MG PO TABS
40.0000 mg | ORAL_TABLET | Freq: Once | ORAL | Status: AC
  Administered 2015-09-12: 40 mg via ORAL
  Filled 2015-09-12: qty 1

## 2015-09-12 MED ORDER — POTASSIUM CHLORIDE CRYS ER 20 MEQ PO TBCR
40.0000 meq | EXTENDED_RELEASE_TABLET | ORAL | Status: AC
  Administered 2015-09-12 (×2): 40 meq via ORAL
  Filled 2015-09-12 (×2): qty 2

## 2015-09-12 MED ORDER — SPIRONOLACTONE 25 MG PO TABS: 25.0000 mg | ORAL_TABLET | Freq: Every day | ORAL | Status: DC

## 2015-09-12 NOTE — Discharge Summary (Signed)
[name], is a 74 y.o. male  DOB 1941-11-20  MRN [id number].  Admission date:  09/08/2015  Admitting Physician  Theressa Millard, MD  Discharge Date:  09/12/2015   Primary MD  [name],[name] [name], MD  Recommendations for primary care physician for things to follow:   Monitor weight, CBC, CMP and diuretic dose closely.  P two-view chest x-ray in 7-10 days   Admission Diagnosis  Leukocytosis [D72.829] CAP (community acquired pneumonia) [J18.9] Fever in adult [R50.9] Sepsis, due to unspecified organism [A41.9]   Discharge Diagnosis  Leukocytosis [D72.829] CAP (community acquired pneumonia) [J18.9] Fever in adult [R50.9] Sepsis, due to unspecified organism [A41.9]    Principal Problem:   Sepsis Active Problems:   CAD, NATIVE VESSEL   Type I diabetes mellitus with complication, uncontrolled   OSA (obstructive sleep apnea)   Hypotension   CAP (community acquired pneumonia)   Leukocytosis      Past Medical History  Diagnosis Date  . Diabetes mellitus   . GERD (gastroesophageal reflux disease)   . Hyperlipidemia   . Hypertension   . CVA (cerebral infarction) 2001, 2004  . CAD (coronary artery disease)     a. 06/2007 PCI LAD->Cypher DES;  b. 02/2010 Cath LM 30-40, LAD 40-94m, patent stent, D2 50, RI 70 ost, LCX 50, OM 30, RCA 30-49m, 30d, PDA 50, RPL 40-50p, nl EF.  . Carotid artery occlusion     a. 07/2011 L CEA;  b. 02/2012 Carotid U/S: RICA [id number], LICA patent.  . Peripheral vascular disease 2001  . History of blood transfusion   . Stroke   . OSA (obstructive sleep apnea) 07/20/2015    Past Surgical History  Procedure Laterality Date  . Angioplasty  2008, 2010    stent placement 2008  . Kidney stone removal  1969  . Inguinal hernia repair  1962    right  . Pr vein bypass  graft,aorto-fem-pop  2008  . Carotid endarterectomy Left Aug. 24, 2012    cea  . Heart stent  06-30-07       HPI  from the history and physical done on the day of admission:    [name] is a 74 y.o. male with a history of CVA x 2, CAD, HTN, DM-1, Hyperlipidemia who presents to the ED with complaints of Fevers and Chills and Non-Productive Cough x 2 days. He report having fever to 105.3 at home today. He was evaluated in the ED and found to have bibasilar opacities on chest X-ray, along with an elevated lactic Acid at 2.59. He was placed on IV Rocephin and Azithromycin and referred for admission.     Hospital Course:     1. Acute hypoxic respiratory failure due to community-acquired pneumonia with sepsis and streptococcal bacteremia. Much improved and symptom-free after supportive care with IV fluids initially along with empiric IV antibiotic, has clinically defervesced off oxygen, ambulating without any problems and eager to go home. Discussed over the phone with Dr. Megan Salon ID. Total of 10-14 days of either  oral amoxicillin or Levaquin. Will place him on Levaquin for 10 more days and discharged home, request BCP to repeat CBC CMP and a 2 view chest x-ray in 7-10 days.   2. 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 Improved with IV Lasix will be continued. BNP was high, on beta blocker continue, will discharge on higher than home dose Lasix along with Aldactone, surprisingly echogram shows a preserved EF and no mention of LVH. Kindly monitor weight and diuretic dose closely.   3. CAD. Status post stent placement. On aspirin , statin and beta blocker continue per home dose.   4. Hypertension. Blood pressure stable to home regimen along with diuretic as above.   5.OSA - CPAP WHILE sleeping.   6. Dyslipidemia. Continue on statin.   7. GERD. On PPI continue.    8. DM type II. Continue home regimen       Discharge Condition: Stable  Follow  UP  Follow-up Information    Follow up with [name],[name] ALLEN, MD. Schedule an appointment as soon as possible for a visit in 3 days.   Specialty:  Family Medicine   Contact information:   [place] [address] [phone]       Follow up with [facility]. Schedule an appointment as soon as possible for a visit in 1 week.   Why:  CHF   Contact information:   [address] [place] [phone]        Consults obtained - None  Diet and Activity recommendation: See Discharge Instructions below  Discharge Instructions         Discharge Instructions    Discharge instructions    Complete by:  As directed   Follow with Primary MD [name],[name] ALLEN, MD in 3 days   Get CBC, CMP, 2 view Chest X ray checked  by Primary MD next visit.    Activity: As tolerated with Full fall precautions use walker/cane & assistance as needed   Disposition Home     Diet: Heart Healthy - Low Carb,  Check your Weight same time everyday, if you gain over 2 pounds, or you develop in leg swelling, experience more shortness of breath or chest pain, call your Primary MD immediately. Follow Cardiac Low Salt Diet and 1.5 lit/day fluid restriction.   On your next visit with your primary care physician please Get Medicines reviewed and adjusted.   Please request your Prim.MD to go over all Hospital Tests and Procedure/Radiological results at the follow up, please get all Hospital records sent to your Prim MD by signing hospital release before you go home.   If you experience worsening of your admission symptoms, develop shortness of breath, life threatening emergency, suicidal or homicidal thoughts you must seek medical attention immediately by calling 911 or calling your MD immediately  if symptoms less severe.  You Must read complete instructions/literature along with all the possible adverse reactions/side effects for all the Medicines you  take and that have been prescribed to you. Take any new Medicines after you have completely understood and accpet all the possible adverse reactions/side effects.   Do not drive, operating heavy machinery, perform activities at heights, swimming or participation in water activities or provide baby sitting services if your were admitted for syncope or siezures until you have seen by Primary MD or a Neurologist and advised to do so again.  Do not drive when taking Pain medications.  Do not take more than prescribed Pain, Sleep and Anxiety Medications  Special Instructions: If you have smoked or chewed Tobacco  in the last 2 yrs please stop smoking, stop any regular Alcohol  and or any Recreational drug use.  Wear Seat belts while driving.   Please note  You were cared for by a hospitalist during your hospital stay. If you have any questions about your discharge medications or the care you received while you were in the hospital after you are discharged, you can call the unit and asked to speak with the hospitalist on call if the hospitalist that took care of you is not available. Once you are discharged, your primary care physician will handle any further medical issues. Please note that NO REFILLS for any discharge medications will be authorized once you are discharged, as it is imperative that you return to your primary care physician (or establish a relationship with a primary care physician if you do not have one) for your aftercare needs so that they can reassess your need for medications and monitor your lab values.     Increase activity slowly    Complete by:  As directed              Discharge Medications       Medication List    TAKE these medications        amLODipine 10 MG tablet  Commonly known as:  NORVASC  TAKE 1 TABLET BY MOUTH ONCE DAILY     aspirin 325 MG tablet  Take 325 mg by mouth daily.     atenolol 100 MG tablet  Commonly known as:  TENORMIN  TAKE 1  TABLET TWICE A DAY     atorvastatin 40 MG tablet  Commonly known as:  LIPITOR  TAKE 1 TABLET (40 MG TOTAL) BY MOUTH DAILY.     furosemide 40 MG tablet  Commonly known as:  LASIX  Take 1 tablet (40 mg total) by mouth 2 (two) times daily. TAKE 60 MG DAILY     glipiZIDE 10 MG tablet  Commonly known as:  GLUCOTROL  TAKE 1 TABLET (10 MG TOTAL) BY MOUTH 2 (TWO) TIMES DAILY BEFORE A MEAL.     glucose blood test strip  Commonly known as:  ONE TOUCH TEST STRIPS  1 each by Other route daily.     Insulin Lispro Prot & Lispro (75-25) 100 UNIT/ML Kwikpen  Commonly known as:  HUMALOG 75/25 MIX  Inject 30 Units into the skin 1 day or 1 dose. INJECT 30 UNITS INTO THE SKIN DAILY WITH SUPPER.     Insulin Pen Needle 32G X 4 MM Misc  Use once daily. Dx E11.9     levofloxacin 750 MG tablet  Commonly known as:  LEVAQUIN  Take 1 tablet (750 mg total) by mouth daily.     lisinopril 40 MG tablet  Commonly known as:  PRINIVIL,ZESTRIL  Take 1 tablet (40 mg total) by mouth daily.     metFORMIN 1000 MG tablet  Commonly known as:  GLUCOPHAGE  TAKE 1 TABLET BY MOUTH TWICE DAILY WITH MEALS     nitroGLYCERIN 0.4 MG SL tablet  Commonly known as:  NITROSTAT  PLACE 1 TABLET (0.4 MG TOTAL) UNDER THE TONGUE EVERY 5 (FIVE) MINUTES AS NEEDED.     omeprazole 20 MG capsule  Commonly known as:  PRILOSEC  TAKE ONE CAPSULE BY MOUTH EVERY DAY     potassium chloride SA 20 MEQ tablet  Commonly known  as:  K-DUR,KLOR-CON  Take 2 tabs twice daily     spironolactone 25 MG tablet  Commonly known as:  ALDACTONE  Take 1 tablet (25 mg total) by mouth daily.        Major procedures and Radiology Reports - PLEASE review detailed and final reports for all details, in brief -   TTE  - Left ventricle: The cavity size was normal. Wall thickness wasnormal. Systolic function was normal. The estimated ejection fraction was in the range of 55% to 60%. Wall motion was normal; there were no regional wall motion  abnormalities. Left ventricular diastolic function parameters were normal.  - Left atrium: The atrium was mildly dilated.  Dg Chest 2 View  09/10/2015   CLINICAL DATA:  Shortness of breath, pneumonia  EXAM: CHEST  2 VIEW  COMPARISON:  09/08/2015  FINDINGS: Improved bibasilar aeration with persistent patchy left lower lobe airspace opacity. Thin linear right lower lobe scarring or atelectasis reidentified. Small pleural effusions. Heart size normal.  IMPRESSION: Improved bibasilar aeration with incomplete resolution of left greater than right airspace opacities as described above.   Electronically Signed   By: Conchita Paris M.D.   On: 09/10/2015 12:19   Dg Chest Portable 1 View  09/08/2015   CLINICAL DATA:  Initial evaluation for acute chest pain, cough.  EXAM: PORTABLE CHEST - 1 VIEW  COMPARISON:  Prior radiograph from 06/28/2015.  FINDINGS: Patient is rotated to the right. Allowing for rotation, cardiac and mediastinal silhouettes are stable in size and contour, and remain within normal limits.  Lungs are mildly hypoinflated. Linear opacity right lung base most compatible with scarring, stable. Mild diffuse prominence of the interstitial markings is not significantly changed. There is slightly more prominent patchy bibasilar opacities as compared to prior study, which may reflect vascular crowding or possibly developing infectious infiltrates. Mid and upper lungs are clear. No pulmonary edema. No definite pleural effusion on this limited single view of the chest. No pneumothorax.  No acute osseus abnormality.  Surgical clips overlie the left neck.  IMPRESSION: 1. Mild bibasilar patchy opacities, which may reflect vascular crowding/atelectasis or possibly developing infectious infiltrates. 2. Stable linear scarring at the right lung base.   Electronically Signed   By: Jeannine Boga M.D.   On: 09/08/2015 22:08    Micro Results      Recent Results (from the past 240 hour(s))  Culture, blood  (routine x 2)     Status: None (Preliminary result)   Collection Time: 09/08/15  8:50 PM  Result Value Ref Range Status   Specimen Description BLOOD RIGHT ARM  Final   Special Requests BOTTLES DRAWN AEROBIC AND ANAEROBIC 5CC  Final   Culture  Setup Time   Final    GRAM POSITIVE COCCI IN CHAINS IN PAIRS IN BOTH AEROBIC AND ANAEROBIC BOTTLES CRITICAL RESULT CALLED TO, READ BACK BY AND VERIFIED WITH: S. SNIDER,RN AT 1059 ON [id number] BY S. YARBROUGH    Culture   Final    GROUP B STREP(S.AGALACTIAE)ISOLATED SUSCEPTIBILITIES PERFORMED ON PREVIOUS CULTURE WITHIN THE LAST 5 DAYS.    Report Status PENDING  Incomplete  Culture, blood (routine x 2)     Status: None   Collection Time: 09/08/15  9:01 PM  Result Value Ref Range Status   Specimen Description BLOOD RIGHT HAND  Final   Special Requests BOTTLES DRAWN AEROBIC AND ANAEROBIC 5CC  Final   Culture  Setup Time   Final    GRAM POSITIVE COCCI IN CHAINS IN PAIRS  ANAEROBIC BOTTLE ONLY CRITICAL RESULT CALLED TO, READ BACK BY AND VERIFIED WITH: Storm Frisk, RN AT 1059 ON [phone] BY Rhea Bleacher    Culture GROUP B STREP(S.AGALACTIAE)ISOLATED  Final   Report Status 09/11/2015 FINAL  Final   Organism ID, Bacteria GROUP B STREP(S.AGALACTIAE)ISOLATED  Final      Susceptibility   Group b strep(s.agalactiae)isolated - MIC*    CLINDAMYCIN >=1 RESISTANT Resistant     AMPICILLIN <=0.25 SENSITIVE Sensitive     ERYTHROMYCIN >=8 RESISTANT Resistant     VANCOMYCIN 0.5 SENSITIVE Sensitive     CEFTRIAXONE <=0.12 SENSITIVE Sensitive     LEVOFLOXACIN 1 SENSITIVE Sensitive     * GROUP B STREP(S.AGALACTIAE)ISOLATED  Urine culture     Status: None   Collection Time: 09/08/15  9:30 PM  Result Value Ref Range Status   Specimen Description URINE, CLEAN CATCH  Final   Special Requests NONE  Final   Culture 30,000 COLONIES/mL CITROBACTER KOSERI  Final   Report Status 09/10/2015 FINAL  Final   Organism ID, Bacteria CITROBACTER KOSERI  Final      Susceptibility    Citrobacter koseri - MIC*    CEFAZOLIN <=4 SENSITIVE Sensitive     CEFTRIAXONE <=1 SENSITIVE Sensitive     CIPROFLOXACIN <=0.25 SENSITIVE Sensitive     GENTAMICIN <=1 SENSITIVE Sensitive     IMIPENEM <=0.25 SENSITIVE Sensitive     NITROFURANTOIN <=16 SENSITIVE Sensitive     TRIMETH/SULFA <=20 SENSITIVE Sensitive     PIP/TAZO <=4 SENSITIVE Sensitive     * 30,000 COLONIES/mL CITROBACTER KOSERI  MRSA PCR Screening     Status: None   Collection Time: 09/09/15  3:00 PM  Result Value Ref Range Status   MRSA by PCR NEGATIVE NEGATIVE Final    Comment:        The GeneXpert MRSA Assay (FDA approved for NASAL specimens only), is one component of a comprehensive MRSA colonization surveillance program. It is not intended to diagnose MRSA infection nor to guide or monitor treatment for MRSA infections.        Today   Subjective    Gatlin Cho today has no headache,no chest abdominal pain,no new weakness tingling or numbness, feels much better wants to go home today.     Objective   Blood pressure 136/51, pulse 76, temperature 97.6 F (36.4 C), temperature source Oral, resp. rate 18, height 5' 9.5" (1.765 m), weight 118.752 kg (261 lb 12.8 oz), SpO2 98 %.   Intake/Output Summary (Last 24 hours) at 09/12/15 1044 Last data filed at 09/12/15 0800  Gross per 24 hour  Intake   1090 ml  Output   2350 ml  Net  -1260 ml    Exam Awake Alert, Oriented x 3, No new F.N deficits, Normal affect [place].AT,PERRAL Supple Neck,No JVD, No cervical lymphadenopathy appriciated.  Symmetrical Chest wall movement, Good air movement bilaterally, CTAB RRR,No Gallops,Rubs or new Murmurs, No Parasternal Heave +ve B.Sounds, Abd Soft, Non tender, No organomegaly appriciated, No rebound -guarding or rigidity. No Cyanosis, Clubbing or edema, No new Rash or bruise   Data Review   CBC w Diff:  Lab Results  Component Value Date   WBC 8.0 09/12/2015   HGB 11.4* 09/12/2015   HCT 34.6* 09/12/2015    PLT 163 09/12/2015   LYMPHOPCT 4* 09/08/2015   MONOPCT 4 09/08/2015   EOSPCT 0 09/08/2015   BASOPCT 0 09/08/2015    CMP:  Lab Results  Component Value Date   NA 137 09/11/2015  K 3.4* 09/12/2015   CL 103 09/11/2015   CO2 27 09/11/2015   BUN 10 09/11/2015   CREATININE 0.86 09/11/2015   PROT 6.7 09/08/2015   ALBUMIN 3.5 09/08/2015   BILITOT 1.3* 09/08/2015   ALKPHOS 52 09/08/2015   AST 23 09/08/2015   ALT 17 09/08/2015  .   Total Time in preparing paper work, data evaluation and todays exam - 35 minutes  Thurnell Lose M.D on 09/12/2015 at 10:44 AM  [facility]   Office  [phone]

## 2015-09-12 NOTE — Progress Notes (Signed)
Patient has his home CPAP and states that he will place it on when he is ready. No chamber for sterile water on patient's home unit. RT will continue to monitor.

## 2015-09-12 NOTE — Discharge Instructions (Signed)
Follow with Primary MD [name],[name] ALLEN, MD in 3 days   Get CBC, CMP, 2 view Chest X ray checked  by Primary MD next visit.    Activity: As tolerated with Full fall precautions use walker/cane & assistance as needed   Disposition Home     Diet: Heart Healthy Low carb,  Check your Weight same time everyday, if you gain over 2 pounds, or you develop in leg swelling, experience more shortness of breath or chest pain, call your Primary MD immediately. Follow Cardiac Low Salt Diet and 1.5 lit/day fluid restriction.   On your next visit with your primary care physician please Get Medicines reviewed and adjusted.   Please request your Prim.MD to go over all Hospital Tests and Procedure/Radiological results at the follow up, please get all Hospital records sent to your Prim MD by signing hospital release before you go home.   If you experience worsening of your admission symptoms, develop shortness of breath, life threatening emergency, suicidal or homicidal thoughts you must seek medical attention immediately by calling 911 or calling your MD immediately  if symptoms less severe.  You Must read complete instructions/literature along with all the possible adverse reactions/side effects for all the Medicines you take and that have been prescribed to you. Take any new Medicines after you have completely understood and accpet all the possible adverse reactions/side effects.   Do not drive, operating heavy machinery, perform activities at heights, swimming or participation in water activities or provide baby sitting services if your were admitted for syncope or siezures until you have seen by Primary MD or a Neurologist and advised to do so again.  Do not drive when taking Pain medications.    Do not take more than prescribed Pain, Sleep and Anxiety Medications  Special Instructions: If you have smoked or chewed Tobacco  in the last 2 yrs please stop smoking, stop any regular Alcohol  and or any  Recreational drug use.  Wear Seat belts while driving.   Please note  You were cared for by a hospitalist during your hospital stay. If you have any questions about your discharge medications or the care you received while you were in the hospital after you are discharged, you can call the unit and asked to speak with the hospitalist on call if the hospitalist that took care of you is not available. Once you are discharged, your primary care physician will handle any further medical issues. Please note that NO REFILLS for any discharge medications will be authorized once you are discharged, as it is imperative that you return to your primary care physician (or establish a relationship with a primary care physician if you do not have one) for your aftercare needs so that they can reassess your need for medications and monitor your lab values.

## 2015-09-14 ENCOUNTER — Telehealth: Payer: Self-pay | Admitting: *Deleted

## 2015-09-14 NOTE — Telephone Encounter (Signed)
Transition Care Management Follow-up Telephone Call  How have you been since you were released from the hospital? Doing real well   Do you understand why you were in the hospital? yes   Do you understand the discharge instrcutions? yes  Items Reviewed:  Medications reviewed: yes, add antibiotic and a diuretic   Allergies reviewed: yes  Dietary changes reviewed: yes - heart health  Referrals reviewed: yes   Functional Questionnaire:   Activities of Daily Living (ADLs):   He states they are independent in the following: ambulation, bathing and hygiene, feeding, continence, grooming, toileting and dressing States they require assistance with the following: none   Any transportation issues/concerns?: no   Any patient concerns? no   Confirmed importance and date/time of follow-up visits scheduled: yes   Confirmed with patient if condition begins to worsen call PCP or go to the ER.  Patient was given the Call-a-Nurse line [phone]: yes Patient was discharged 09/12/15 Patient was discharged to home Patient has an appointment 09/15/15 with Georgina Snell

## 2015-09-15 ENCOUNTER — Encounter: Payer: Self-pay | Admitting: Adult Health

## 2015-09-15 ENCOUNTER — Ambulatory Visit (INDEPENDENT_AMBULATORY_CARE_PROVIDER_SITE_OTHER): Payer: Medicare Other | Admitting: Adult Health

## 2015-09-15 VITALS — BP 120/70 | HR 71 | Wt 257.0 lb

## 2015-09-15 DIAGNOSIS — R600 Localized edema: Secondary | ICD-10-CM

## 2015-09-15 DIAGNOSIS — Z09 Encounter for follow-up examination after completed treatment for conditions other than malignant neoplasm: Secondary | ICD-10-CM

## 2015-09-15 DIAGNOSIS — J189 Pneumonia, unspecified organism: Secondary | ICD-10-CM | POA: Diagnosis not present

## 2015-09-15 LAB — COMPREHENSIVE METABOLIC PANEL
ALT: 33 U/L (ref 0–53)
AST: 24 U/L (ref 0–37)
Albumin: 3.5 g/dL (ref 3.5–5.2)
Alkaline Phosphatase: 74 U/L (ref 39–117)
BUN: 29 mg/dL — ABNORMAL HIGH (ref 6–23)
CO2: 28 mEq/L (ref 19–32)
Calcium: 9.3 mg/dL (ref 8.4–10.5)
Chloride: 104 mEq/L (ref 96–112)
Creatinine, Ser: 1.18 mg/dL (ref 0.40–1.50)
GFR: 64.1 mL/min (ref >60.00)
Glucose, Bld: 81 mg/dL (ref 70–99)
Potassium: 4.2 mEq/L (ref 3.5–5.1)
Sodium: 141 mEq/L (ref 135–145)
Total Bilirubin: 1.1 mg/dL (ref 0.2–1.2)
Total Protein: 6.9 g/dL (ref 6.0–8.3)

## 2015-09-15 LAB — CBC WITH DIFFERENTIAL/PLATELET
Basophils Absolute: 0 10*3/uL (ref 0.0–0.1)
Basophils Relative: 0.2 % (ref 0.0–3.0)
Eosinophils Absolute: 0.2 10*3/uL (ref 0.0–0.7)
Eosinophils Relative: 2.2 % (ref 0.0–5.0)
HCT: 38.8 % — ABNORMAL LOW (ref 39.0–52.0)
Hemoglobin: 12.8 g/dL — ABNORMAL LOW (ref 13.0–17.0)
Lymphocytes Relative: 21.4 % (ref 12.0–46.0)
Lymphs Abs: 2.2 10*3/uL (ref 0.7–4.0)
MCHC: 32.9 g/dL (ref 30.0–36.0)
MCV: 88.6 fl (ref 78.0–100.0)
Monocytes Absolute: 0.9 10*3/uL (ref 0.1–1.0)
Monocytes Relative: 8.3 % (ref 3.0–12.0)
Neutro Abs: 7.1 10*3/uL (ref 1.4–7.7)
Neutrophils Relative %: 67.9 % (ref 43.0–77.0)
Platelets: 283 10*3/uL (ref 150.0–400.0)
RBC: 4.38 Mil/uL (ref 4.22–5.81)
RDW: 15.4 % (ref 11.5–15.5)
WBC: 10.5 10*3/uL (ref 4.0–10.5)

## 2015-09-15 NOTE — Progress Notes (Signed)
Subjective:    Patient ID: [name], male    DOB: 1941-02-22, 74 y.o.   MRN: [id number]  HPI  74 year old male who presents to the office today for hospital follow-up. He was seen on 09/08/2015 and discharged on 09/12/2015.   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 He report having fever to 105.3 at home today. He was evaluated in the ED and found to have bibasilar opacities on chest X-ray, along with an elevated lactic Acid at 2.59. He was placed on IV Rocephin and Azithromycin and referred for admission."   He was diagnosed with   1. Acute hypoxic respiratory failure due to community-acquired pneumonia with sepsis and streptococcal bacteremia. Much improved and symptom-free after supportive care with IV fluids initially along with empiric IV antibiotic, Will place him on Levaquin for 10 more days and discharged home, request BCP to repeat CBC CMP and a 2 view chest x-ray in 7-10 days.   2. 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 Improved with IV Lasix will be continued. BNP was high, on beta blocker continue, will discharge on higher than home dose Lasix along with Aldactone, surprisingly echogram shows a preserved EF and no mention of LVH. Kindly monitor weight and diuretic dose closely.  Wt Readings from Last 3 Encounters:  09/15/15 257 lb (116.574 kg)  09/12/15 261 lb 12.8 oz (118.752 kg)  06/28/15 274 lb (124.286 kg)     In the office today he reports that he feels " much better". Denies any fevers or feeling ill. Denies any productive cough. He still has 7 days of Levaquin.   Review of Systems  Constitutional: Negative.   HENT: Negative.   Respiratory: Negative.   Cardiovascular: Positive for leg swelling.  Endocrine: Negative.   Genitourinary: Negative.   Musculoskeletal: Negative.   Skin: Negative.   Allergic/Immunologic:  Negative.   Neurological: Negative.   Hematological: Negative.   Psychiatric/Behavioral: Negative.   All other systems reviewed and are negative.  Past Medical History  Diagnosis Date  . Diabetes mellitus   . GERD (gastroesophageal reflux disease)   . Hyperlipidemia   . Hypertension   . CVA (cerebral infarction) 2001, 2004  . CAD (coronary artery disease)     a. 06/2007 PCI LAD->Cypher DES;  b. 02/2010 Cath LM 30-40, LAD 40-63m, patent stent, D2 50, RI 70 ost, LCX 50, OM 30, RCA 30-37m, 30d, PDA 50, RPL 40-50p, nl EF.  . Carotid artery occlusion     a. 07/2011 L CEA;  b. 02/2012 Carotid U/S: RICA 123456, LICA patent.  . Peripheral vascular disease 2001  . History of blood transfusion   . Stroke   . OSA (obstructive sleep apnea) 07/20/2015    Social History   Social History  . Marital Status: Married    Spouse Name: N/A  . Number of Children: N/A  . Years of Education: N/A   Occupational History  . Medical illustrator    Social History Main Topics  . Smoking status: Never Smoker   . Smokeless tobacco: Never Used  . Alcohol Use: No  . Drug Use: No  . Sexual Activity: Not on file   Other Topics Concern  . Not on file   Social History Narrative    Past Surgical History  Procedure Laterality Date  . Angioplasty  2008, 2010  stent placement 2008  . Kidney stone removal  1969  . Inguinal hernia repair  1962    right  . Pr vein bypass graft,aorto-fem-pop  2008  . Carotid endarterectomy Left Aug. 24, 2012    cea  . Heart stent  06-30-07    Family History  Problem Relation Age of Onset  . Heart disease Mother     CHF Heart Disease before age 57  . Heart disease Father     Heart Disease before age 8  . Cancer Brother     Lung  . Cancer Brother     Lung  . COPD Brother     Allergies  Allergen Reactions  . Clopidogrel Bisulfate Itching and Rash    REACTION: rash, itching from Head to Toe   -  PLAVIX   . Adhesive [Tape] Rash    rash    Current Outpatient  Prescriptions on File Prior to Visit  Medication Sig Dispense Refill  . amLODipine (NORVASC) 10 MG tablet TAKE 1 TABLET BY MOUTH ONCE DAILY 90 tablet 3  . aspirin 325 MG tablet Take 325 mg by mouth daily.     [name] atenolol (TENORMIN) 100 MG tablet TAKE 1 TABLET TWICE A DAY 200 tablet 3  . atorvastatin (LIPITOR) 40 MG tablet TAKE 1 TABLET (40 MG TOTAL) BY MOUTH DAILY. 90 tablet 2  . furosemide (LASIX) 40 MG tablet Take 1 tablet (40 mg total) by mouth 2 (two) times daily. TAKE 60 MG DAILY 30 tablet 0  . glipiZIDE (GLUCOTROL) 10 MG tablet TAKE 1 TABLET (10 MG TOTAL) BY MOUTH 2 (TWO) TIMES DAILY BEFORE A MEAL. 200 tablet 3  . glucose blood (ONE TOUCH TEST STRIPS) test strip 1 each by Other route daily. 100 each 12  . Insulin Lispro Prot & Lispro (HUMALOG 75/25 MIX) (75-25) 100 UNIT/ML Kwikpen Inject 30 Units into the skin 1 day or 1 dose. INJECT 30 UNITS INTO THE SKIN DAILY WITH SUPPER. (Patient taking differently: Inject 25 Units into the skin at bedtime. ) 2 pen 0  . Insulin Pen Needle 32G X 4 MM MISC Use once daily. Dx E11.9 100 each 3  . levofloxacin (LEVAQUIN) 750 MG tablet Take 1 tablet (750 mg total) by mouth daily. 10 tablet 0  . lisinopril (PRINIVIL,ZESTRIL) 40 MG tablet Take 1 tablet (40 mg total) by mouth daily. 100 tablet 3  . metFORMIN (GLUCOPHAGE) 1000 MG tablet TAKE 1 TABLET BY MOUTH TWICE DAILY WITH MEALS 200 tablet 3  . nitroGLYCERIN (NITROSTAT) 0.4 MG SL tablet PLACE 1 TABLET (0.4 MG TOTAL) UNDER THE TONGUE EVERY 5 (FIVE) MINUTES AS NEEDED. 25 tablet 0  . omeprazole (PRILOSEC) 20 MG capsule TAKE ONE CAPSULE BY MOUTH EVERY DAY 90 capsule 3  . potassium chloride SA (K-DUR,KLOR-CON) 20 MEQ tablet Take 2 tabs twice daily 400 tablet 3  . spironolactone (ALDACTONE) 25 MG tablet Take 1 tablet (25 mg total) by mouth daily. 30 tablet 0   No current facility-administered medications on file prior to visit.    BP 120/70 mmHg  Pulse 71  Wt 257 lb (116.574 kg)       Objective:   Physical  Exam  Constitutional: He is oriented to person, place, and time. He appears well-developed and well-nourished. No distress.  Eyes: Right eye exhibits no discharge. Left eye exhibits no discharge.  Cardiovascular: Normal rate, regular rhythm, normal heart sounds and intact distal pulses.  Exam reveals no gallop and no friction rub.   No murmur heard.  Pulmonary/Chest: Effort normal and breath sounds normal. No respiratory distress. He has no wheezes. He has no rales. He exhibits no tenderness.  Abdominal: Soft.  Musculoskeletal: Normal range of motion. He exhibits edema and tenderness.  Lymphadenopathy:    He has no cervical adenopathy.  Neurological: He is alert and oriented to person, place, and time. He has normal reflexes.  Skin: Skin is warm and dry. No rash noted. He is not diaphoretic. No erythema. No pallor.  Psychiatric: He has a normal mood and affect. His behavior is normal. Judgment and thought content normal.  Nursing note and vitals reviewed.     Assessment & Plan:  1. CAP (community acquired pneumonia) - CBC with Differential/Platelet - CMP - DG Chest 2 View; Future - Continue to monitor for fevers, productive cough, ill feeling and follow up if any of these signs presents themselves.  - Finish Abx therapy  2. Hospital discharge follow-up - CBC with Differential/Platelet - CMP - DG Chest 2 View; Future   3. Bilateral edema of lower extremity - Continue with current diuretic therapy until seen by Cardiology in a week.  - Stay hydrated but keep fluid restriction to 1.5-2 L -

## 2015-09-15 NOTE — Patient Instructions (Signed)
It was great meeting you today and I am so happy you are feeling better.   I will follow up with you regarding your labs.   Remember to go and get a chest xray on or around 09/22/2015.   If you need anything, please let me know.

## 2015-09-20 ENCOUNTER — Encounter: Payer: Self-pay | Admitting: Cardiology

## 2015-09-20 ENCOUNTER — Ambulatory Visit (INDEPENDENT_AMBULATORY_CARE_PROVIDER_SITE_OTHER): Payer: Medicare Other | Admitting: Cardiology

## 2015-09-20 VITALS — BP 144/62 | HR 76 | Ht 69.5 in | Wt 250.8 lb

## 2015-09-20 DIAGNOSIS — I10 Essential (primary) hypertension: Secondary | ICD-10-CM

## 2015-09-20 DIAGNOSIS — Z79899 Other long term (current) drug therapy: Secondary | ICD-10-CM | POA: Diagnosis not present

## 2015-09-20 DIAGNOSIS — I5031 Acute diastolic (congestive) heart failure: Secondary | ICD-10-CM | POA: Diagnosis not present

## 2015-09-20 DIAGNOSIS — I6523 Occlusion and stenosis of bilateral carotid arteries: Secondary | ICD-10-CM

## 2015-09-20 DIAGNOSIS — E669 Obesity, unspecified: Secondary | ICD-10-CM

## 2015-09-20 DIAGNOSIS — I2510 Atherosclerotic heart disease of native coronary artery without angina pectoris: Secondary | ICD-10-CM

## 2015-09-20 DIAGNOSIS — G4733 Obstructive sleep apnea (adult) (pediatric): Secondary | ICD-10-CM

## 2015-09-20 HISTORY — DX: Acute diastolic (congestive) heart failure: I50.31

## 2015-09-20 MED ORDER — FUROSEMIDE 40 MG PO TABS: 20.0000 mg | ORAL_TABLET | Freq: Two times a day (BID) | ORAL | Status: DC

## 2015-09-20 MED ORDER — FUROSEMIDE 40 MG PO TABS: 40.0000 mg | ORAL_TABLET | Freq: Every day | ORAL | Status: DC

## 2015-09-20 NOTE — Patient Instructions (Addendum)
Your physician has recommended you make the following change in your medication: STOP the potassium. Decrease the furosemide to 40 mg daily. ( 1 tablet daily)  Your physician recommends that you return for lab work on Monday  09/25/15. This is a non- fasting test. It is ok to eat.  Your physician recommends that you schedule a follow-up appointment in: 3 months with Dr. Stanford Breed.

## 2015-09-20 NOTE — Assessment & Plan Note (Addendum)
BMI 36.5

## 2015-09-20 NOTE — Assessment & Plan Note (Signed)
On C-pap 

## 2015-09-20 NOTE — Assessment & Plan Note (Signed)
Recent admission for CAP with superimposed CHF. His wgt is down > 20 lbs from admission.

## 2015-09-20 NOTE — Progress Notes (Signed)
09/20/2015 [name]   January 03, 1941  [id number]  Primary Physician Jacob [name], MD Primary Cardiologist: Dr [name]  HPI:  74 y/o obese male with a history of CAD, s/p LAD DES '08, cath in 2011 showed mild CAD, Myoview was low risk in 2014. He recently was admitted with CAP. We did not see him during that hospitalization.  His wgt was 272 on admission, it was down to 248 this am at home. He was seen by his PCP 09/15/15 and his fluid restriction was liberalized. The pt had labs done, BUN 29/ SCr 1.18, K+ 4.2. Overall he feels weak but improving.    Current Outpatient Prescriptions  Medication Sig Dispense Refill  . amLODipine (NORVASC) 10 MG tablet TAKE 1 TABLET BY MOUTH ONCE DAILY 90 tablet 3  . aspirin 325 MG tablet Take 325 mg by mouth daily.     [name] atenolol (TENORMIN) 100 MG tablet TAKE 1 TABLET TWICE A DAY 200 tablet 3  . atorvastatin (LIPITOR) 40 MG tablet TAKE 1 TABLET (40 MG TOTAL) BY MOUTH DAILY. 90 tablet 2  . furosemide (LASIX) 40 MG tablet Take 1 tablet (40 mg total) by mouth daily. 30 tablet 0  . glipiZIDE (GLUCOTROL) 10 MG tablet TAKE 1 TABLET (10 MG TOTAL) BY MOUTH 2 (TWO) TIMES DAILY BEFORE A MEAL. 200 tablet 3  . glucose blood (ONE TOUCH TEST STRIPS) test strip 1 each by Other route daily. 100 each 12  . Insulin Lispro Prot & Lispro (HUMALOG 75/25 MIX) (75-25) 100 UNIT/ML Kwikpen Inject 30 Units into the skin 1 day or 1 dose. INJECT 30 UNITS INTO THE SKIN DAILY WITH SUPPER. (Patient taking differently: Inject 25 Units into the skin at bedtime. ) 2 pen 0  . Insulin Pen Needle 32G X 4 MM MISC Use once daily. Dx E11.9 100 each 3  . KLOR-CON M20 20 MEQ tablet Take 1 tablet by mouth daily.    [name] levofloxacin (LEVAQUIN) 750 MG tablet Take 1 tablet (750 mg total) by mouth daily. 10 tablet 0  . lisinopril (PRINIVIL,ZESTRIL) 40 MG tablet Take 1 tablet (40 mg total) by mouth daily. 100 tablet 3  . metFORMIN (GLUCOPHAGE) 1000 MG tablet TAKE 1 TABLET BY MOUTH TWICE  DAILY WITH MEALS 200 tablet 3  . nitroGLYCERIN (NITROSTAT) 0.4 MG SL tablet PLACE 1 TABLET (0.4 MG TOTAL) UNDER THE TONGUE EVERY 5 (FIVE) MINUTES AS NEEDED. 25 tablet 0  . omeprazole (PRILOSEC) 20 MG capsule TAKE ONE CAPSULE BY MOUTH EVERY DAY 90 capsule 3  . spironolactone (ALDACTONE) 25 MG tablet Take 1 tablet (25 mg total) by mouth daily. 30 tablet 0   No current facility-administered medications for this visit.    Allergies  Allergen Reactions  . Clopidogrel Bisulfate Itching and Rash    REACTION: rash, itching from Head to Toe   -  PLAVIX   . Adhesive [Tape] Rash    rash    Social History   Social History  . Marital Status: Married    Spouse Name: N/A  . Number of Children: N/A  . Years of Education: N/A   Occupational History  . Medical illustrator    Social History Main Topics  . Smoking status: Never Smoker   . Smokeless tobacco: Never Used  . Alcohol Use: No  . Drug Use: No  . Sexual Activity: Not on file   Other Topics Concern  . Not on file   Social History Narrative     Review of Systems: General:  negative for chills, fever, night sweats or weight changes.  Cardiovascular: negative for chest pain, dyspnea on exertion, edema, orthopnea, palpitations, paroxysmal nocturnal dyspnea or shortness of breath Dermatological: negative for rash Respiratory: negative for cough or wheezing Urologic: negative for hematuria Abdominal: negative for nausea, vomiting, diarrhea, bright red blood per rectum, melena, or hematemesis Neurologic: negative for visual changes, syncope, or dizziness All other systems reviewed and are otherwise negative except as noted above.    Blood pressure 144/62, pulse 76, height 5' 9.5" (1.765 m), weight 250 lb 12.8 oz (113.762 kg).  General appearance: alert, cooperative, no distress and morbidly obese Neck: no carotid bruit and no JVD Lungs: clear to auscultation bilaterally Heart: regular rate and rhythm Extremities: extremities normal,  atraumatic, no cyanosis or edema Neurologic: Grossly normal  EKG 09/09/15- NSR, ST  Echo: 09/11/15 Study Conclusions  - Left ventricle: The cavity size was normal. Wall thickness was normal. Systolic function was normal. The estimated ejection fraction was in the range of 55% to 60%. Wall motion was normal; there were no regional wall motion abnormalities. Left ventricular diastolic function parameters were normal. - Left atrium: The atrium was mildly dilated.  ASSESSMENT AND PLAN:   Acute diastolic congestive heart failure Recent admission for CAP with superimposed CHF. His wgt is down > 20 lbs from admission.  CAD, NATIVE VESSEL LAD PCI, '08, cath 2011- med Rx, Myoview low risk 2014  Essential hypertension Controlled  Obesity BMI 36.5  OSA (obstructive sleep apnea) On C-pap   PLAN  I decreased his Lasix to 40 mg daily, stopped his K+ as he is on Aldactone, Lasix, and Lisinopril. We'll get a BMP Monday. If his wgt drops below 245 I suggested he hold his Lasix till it's above that. F/U Dr [name] 3 months.  [name] K PA-C 09/20/2015 4:18 PM

## 2015-09-20 NOTE — Assessment & Plan Note (Signed)
Controlled.  

## 2015-09-20 NOTE — Assessment & Plan Note (Signed)
LAD PCI, '08, cath 2011- med Rx, Myoview low risk 2014

## 2015-09-26 ENCOUNTER — Ambulatory Visit (INDEPENDENT_AMBULATORY_CARE_PROVIDER_SITE_OTHER)
Admission: RE | Admit: 2015-09-26 | Discharge: 2015-09-26 | Disposition: A | Discharge status: Home / Self Care | Payer: Medicare Other | Source: Ambulatory Visit | Attending: Adult Health | Admitting: Adult Health

## 2015-09-26 ENCOUNTER — Encounter: Payer: Self-pay | Admitting: Adult Health

## 2015-09-26 ENCOUNTER — Ambulatory Visit (INDEPENDENT_AMBULATORY_CARE_PROVIDER_SITE_OTHER): Payer: Medicare Other | Admitting: Adult Health

## 2015-09-26 VITALS — BP 126/60 | HR 71 | Temp 97.8°F | Ht 69.0 in | Wt 255.0 lb

## 2015-09-26 DIAGNOSIS — J189 Pneumonia, unspecified organism: Secondary | ICD-10-CM | POA: Diagnosis not present

## 2015-09-26 DIAGNOSIS — Z09 Encounter for follow-up examination after completed treatment for conditions other than malignant neoplasm: Secondary | ICD-10-CM

## 2015-09-26 DIAGNOSIS — I6523 Occlusion and stenosis of bilateral carotid arteries: Secondary | ICD-10-CM | POA: Diagnosis not present

## 2015-09-26 DIAGNOSIS — Z79899 Other long term (current) drug therapy: Secondary | ICD-10-CM | POA: Diagnosis not present

## 2015-09-26 DIAGNOSIS — E162 Hypoglycemia, unspecified: Secondary | ICD-10-CM

## 2015-09-26 LAB — POCT CBG MONITORING: Glucose, fingerstick: 83

## 2015-09-26 MED ORDER — METFORMIN HCL 500 MG PO TABS: 250.0000 mg | ORAL_TABLET | Freq: Two times a day (BID) | ORAL | Status: DC

## 2015-09-26 NOTE — Progress Notes (Signed)
Pre visit review using our clinic review tool, if applicable. No additional management support is needed unless otherwise documented below in the visit note. 

## 2015-09-26 NOTE — Progress Notes (Addendum)
Subjective:    Patient ID: [name], male    DOB: 01-03-1941, 74 y.o.   MRN: [id number]  HPI  74 year old male who presents to the office today for low blood sugars. He reports that during this week his blood sugars have been low   Today (9/27) am 67  9/26  Am 73 Noon 66 5 pm 112 11 pm 146  9/25 Am 69 Noon 91 2:30p 109 11pm 294   He has not taken his metformin, glipizide or insulin in four days. The lowest his blood sugars have been in the last 7 days has been 44 at 9:30 am on 9/24.   He saw Cardiology on 09/20/2015 at which time he was continued on Aldactone 25mg , his Lasix was reduced from 80 to 40mg  and his potassium supplement was discontinued.   Since being released from the hospital on 09/12/2015 for PNA he has changed his diet and is eating less with a low sodium diet. He has lost 20 pounds since admission. This has been mostly from swelling in his ankles.     Wt Readings from Last 3 Encounters:  09/26/15 255 lb (115.667 kg)  09/20/15 250 lb 12.8 oz (113.762 kg)  09/15/15 257 lb (116.574 kg)     Review of Systems  Constitutional: Negative.   Eyes: Negative.   Respiratory: Negative.   Cardiovascular: Negative.   Gastrointestinal: Negative.   Genitourinary: Negative.   Skin: Negative.   Neurological: Positive for dizziness (resolved).   Past Medical History  Diagnosis Date  . Diabetes mellitus   . GERD (gastroesophageal reflux disease)   . Hyperlipidemia   . Hypertension   . CVA (cerebral infarction) 2001, 2004  . CAD (coronary artery disease)     a. 06/2007 PCI LAD->Cypher DES;  b. 02/2010 Cath LM 30-40, LAD 40-58m, patent stent, D2 50, RI 70 ost, LCX 50, OM 30, RCA 30-42m, 30d, PDA 50, RPL 40-50p, nl EF.  . Carotid artery occlusion     a. 07/2011 L CEA;  b. 02/2012 Carotid U/S: RICA 123456, LICA patent.  . Peripheral vascular disease 2001  . History of blood transfusion   . Stroke   . OSA (obstructive sleep apnea) 07/20/2015  . Acute  diastolic congestive heart failure 09/20/2015    Social History   Social History  . Marital Status: Married    Spouse Name: N/A  . Number of Children: N/A  . Years of Education: N/A   Occupational History  . Medical illustrator    Social History Main Topics  . Smoking status: Never Smoker   . Smokeless tobacco: Never Used  . Alcohol Use: No  . Drug Use: No  . Sexual Activity: Not on file   Other Topics Concern  . Not on file   Social History Narrative    Past Surgical History  Procedure Laterality Date  . Angioplasty  2008, 2010    stent placement 2008  . Kidney stone removal  1969  . Inguinal hernia repair  1962    right  . Pr vein bypass graft,aorto-fem-pop  2008  . Carotid endarterectomy Left Aug. 24, 2012    cea  . Heart stent  06-30-07    Family History  Problem Relation Age of Onset  . Heart disease Mother     CHF Heart Disease before age 64  . Heart disease Father     Heart Disease before age 50  . Cancer Brother     Lung  . Cancer  Brother     Lung  . COPD Brother     Allergies  Allergen Reactions  . Clopidogrel Bisulfate Itching and Rash    REACTION: rash, itching from Head to Toe   -  PLAVIX   . Adhesive [Tape] Rash    rash    Current Outpatient Prescriptions on File Prior to Visit  Medication Sig Dispense Refill  . amLODipine (NORVASC) 10 MG tablet TAKE 1 TABLET BY MOUTH ONCE DAILY 90 tablet 3  . aspirin 325 MG tablet Take 325 mg by mouth daily.     [name] atenolol (TENORMIN) 100 MG tablet TAKE 1 TABLET TWICE A DAY 200 tablet 3  . atorvastatin (LIPITOR) 40 MG tablet TAKE 1 TABLET (40 MG TOTAL) BY MOUTH DAILY. 90 tablet 2  . furosemide (LASIX) 40 MG tablet Take 1 tablet (40 mg total) by mouth daily. 30 tablet 0  . glipiZIDE (GLUCOTROL) 10 MG tablet TAKE 1 TABLET (10 MG TOTAL) BY MOUTH 2 (TWO) TIMES DAILY BEFORE A MEAL. 200 tablet 3  . glucose blood (ONE TOUCH TEST STRIPS) test strip 1 each by Other route daily. 100 each 12  . Insulin Lispro Prot &  Lispro (HUMALOG 75/25 MIX) (75-25) 100 UNIT/ML Kwikpen Inject 30 Units into the skin 1 day or 1 dose. INJECT 30 UNITS INTO THE SKIN DAILY WITH SUPPER. (Patient taking differently: Inject 25 Units into the skin at bedtime. ) 2 pen 0  . Insulin Pen Needle 32G X 4 MM MISC Use once daily. Dx E11.9 100 each 3  . lisinopril (PRINIVIL,ZESTRIL) 40 MG tablet Take 1 tablet (40 mg total) by mouth daily. 100 tablet 3  . metFORMIN (GLUCOPHAGE) 1000 MG tablet TAKE 1 TABLET BY MOUTH TWICE DAILY WITH MEALS 200 tablet 3  . nitroGLYCERIN (NITROSTAT) 0.4 MG SL tablet PLACE 1 TABLET (0.4 MG TOTAL) UNDER THE TONGUE EVERY 5 (FIVE) MINUTES AS NEEDED. 25 tablet 0  . omeprazole (PRILOSEC) 20 MG capsule TAKE ONE CAPSULE BY MOUTH EVERY DAY 90 capsule 3  . spironolactone (ALDACTONE) 25 MG tablet Take 1 tablet (25 mg total) by mouth daily. 30 tablet 0   No current facility-administered medications on file prior to visit.    BP 126/60 mmHg  Pulse 71  Temp(Src) 97.8 F (36.6 C) (Oral)  Ht 5\' 9"  (1.753 m)  Wt 255 lb (115.667 kg)  BMI 37.64 kg/m2  SpO2 98%       Objective:   Physical Exam  Constitutional: He appears well-developed and well-nourished. No distress.  Cardiovascular: Normal rate, regular rhythm, normal heart sounds and intact distal pulses.  Exam reveals no gallop and no friction rub.   No murmur heard. Pulmonary/Chest: Effort normal and breath sounds normal. No respiratory distress. He has no wheezes. He has no rales. He exhibits no tenderness.  Abdominal: Bowel sounds are normal.  Skin: Skin is warm and dry. No rash noted. He is not diaphoretic. No erythema. No pallor.  Psychiatric: He has a normal mood and affect. His behavior is normal. Judgment and thought content normal.  Nursing note and vitals reviewed.     Assessment & Plan:  1. Hypoglycemia - discontinue glipizide and insulin - His blood sugars are low in the morning and throughout the afternoon. They start to increase after lunch and  into the evening.  - Take 250 mg Metformin with lunch  - Continue to monitor blood sugar at home TID.  - Follow up with me in one week or sooner if blood sugars are too  high or low.  - POCT CBG monitoring; Standing - POCT CBG monitoring - Unsure of cause of lower blood sugars. Most likely the combination of decreased weight, better diet, and decrease in lasix.

## 2015-09-26 NOTE — Patient Instructions (Addendum)
I think that your low blood sugars are from a combination of weight loss, change in diet and a decrease in Lasix.   Stop taking the insulin and glipizide for now.   Start taking 250 mg of Metformin with lunch  Continue to monitor your blood sugar at home three times a day.   Follow up with me next week.

## 2015-09-27 LAB — BASIC METABOLIC PANEL
BUN: 22 mg/dL (ref 7–25)
CO2: 27 mmol/L (ref 20–31)
Calcium: 9.1 mg/dL (ref 8.6–10.3)
Chloride: 100 mmol/L (ref 98–110)
Creat: 1.09 mg/dL (ref 0.70–1.18)
Glucose, Bld: 169 mg/dL — ABNORMAL HIGH (ref 65–99)
Potassium: 4.2 mmol/L (ref 3.5–5.3)
Sodium: 138 mmol/L (ref 135–146)

## 2015-10-04 ENCOUNTER — Encounter: Payer: Self-pay | Admitting: Internal Medicine

## 2015-10-05 ENCOUNTER — Encounter: Payer: Self-pay | Admitting: Adult Health

## 2015-10-05 ENCOUNTER — Ambulatory Visit (INDEPENDENT_AMBULATORY_CARE_PROVIDER_SITE_OTHER): Payer: Medicare Other | Admitting: Adult Health

## 2015-10-05 VITALS — BP 158/72 | HR 64 | Temp 97.7°F | Wt 255.0 lb

## 2015-10-05 DIAGNOSIS — I6523 Occlusion and stenosis of bilateral carotid arteries: Secondary | ICD-10-CM

## 2015-10-05 DIAGNOSIS — E1065 Type 1 diabetes mellitus with hyperglycemia: Secondary | ICD-10-CM

## 2015-10-05 DIAGNOSIS — R600 Localized edema: Secondary | ICD-10-CM | POA: Diagnosis not present

## 2015-10-05 DIAGNOSIS — E108 Type 1 diabetes mellitus with unspecified complications: Secondary | ICD-10-CM | POA: Diagnosis not present

## 2015-10-05 DIAGNOSIS — IMO0002 Reserved for concepts with insufficient information to code with codable children: Secondary | ICD-10-CM

## 2015-10-05 LAB — BASIC METABOLIC PANEL
BUN: 18 mg/dL (ref 6–23)
CO2: 29 mEq/L (ref 19–32)
Calcium: 9.4 mg/dL (ref 8.4–10.5)
Chloride: 99 mEq/L (ref 96–112)
Creatinine, Ser: 0.98 mg/dL (ref 0.40–1.50)
GFR: 79.41 mL/min (ref >60.00)
Glucose, Bld: 281 mg/dL — ABNORMAL HIGH (ref 70–99)
Potassium: 4.8 mEq/L (ref 3.5–5.1)
Sodium: 137 mEq/L (ref 135–145)

## 2015-10-05 NOTE — Progress Notes (Signed)
Subjective:    Patient ID: [name], male    DOB: [birth year], 74 y.o.   MRN: [id number]  HPI  74 year old male who presents to the office today for follow up regarding his diabetes. The last time I saw him his blood sugars were dropping into the 70's, with the lowest being 44. At that visit he had not taken any of his diabetes medications for 4 days and it continued to be low. He was advised to take 250 mg Metformin at lunch.    Today he endorses that his blood sugars have been elevated in the 200-300's. He is still taking the Metformin as directed. States " I feel fine."   His lower extremity edema is resolving back to baseline. He continues on Lasix and Spirolactone. He is not drinking as much, he may drink 1-1.5 liters per day.   Review of Systems  Constitutional: Negative.   HENT: Negative.   Respiratory: Negative.   Cardiovascular: Positive for leg swelling. Negative for chest pain and palpitations.  Gastrointestinal: Negative.   Musculoskeletal: Negative.   Neurological: Negative.   Hematological: Negative.   All other systems reviewed and are negative.  Past Medical History  Diagnosis Date  . Diabetes mellitus   . GERD (gastroesophageal reflux disease)   . Hyperlipidemia   . Hypertension   . CVA (cerebral infarction) 2001, 2004  . CAD (coronary artery disease)     a. 06/2007 PCI LAD->Cypher DES;  b. 02/2010 Cath LM 30-40, LAD 40-18m, patent stent, D2 50, RI 70 ost, LCX 50, OM 30, RCA 30-5m, 30d, PDA 50, RPL 40-50p, nl EF.  . Carotid artery occlusion     a. 07/2011 L CEA;  b. 02/2012 Carotid U/S: RICA [id number], LICA patent.  . Peripheral vascular disease 2001  . History of blood transfusion   . Stroke   . OSA (obstructive sleep apnea) 07/20/2015  . Acute diastolic congestive heart failure 09/20/2015    Social History   Social History  . Marital Status: Married    Spouse Name: N/A  . Number of Children: N/A  . Years of Education: N/A   Occupational  History  . Medical illustrator    Social History Main Topics  . Smoking status: Never Smoker   . Smokeless tobacco: Never Used  . Alcohol Use: No  . Drug Use: No  . Sexual Activity: Not on file   Other Topics Concern  . Not on file   Social History Narrative    Past Surgical History  Procedure Laterality Date  . Angioplasty  2008, 2010    stent placement 2008  . Kidney stone removal  1969  . Inguinal hernia repair  1962    right  . Pr vein bypass graft,aorto-fem-pop  2008  . Carotid endarterectomy Left Aug. 24, 2012    cea  . Heart stent  06-30-07    Family History  Problem Relation Age of Onset  . Heart disease Mother     CHF Heart Disease before age 70  . Heart disease Father     Heart Disease before age 28  . Cancer Brother     Lung  . Cancer Brother     Lung  . COPD Brother     Allergies  Allergen Reactions  . Clopidogrel Bisulfate Itching and Rash    REACTION: rash, itching from Head to Toe   -  PLAVIX   . Adhesive [Tape] Rash    rash    Current Outpatient  Prescriptions on File Prior to Visit  Medication Sig Dispense Refill  . amLODipine (NORVASC) 10 MG tablet TAKE 1 TABLET BY MOUTH ONCE DAILY 90 tablet 3  . aspirin 325 MG tablet Take 325 mg by mouth daily.     [name] atenolol (TENORMIN) 100 MG tablet TAKE 1 TABLET TWICE A DAY 200 tablet 3  . atorvastatin (LIPITOR) 40 MG tablet TAKE 1 TABLET (40 MG TOTAL) BY MOUTH DAILY. 90 tablet 2  . furosemide (LASIX) 40 MG tablet Take 1 tablet (40 mg total) by mouth daily. 30 tablet 0  . glipiZIDE (GLUCOTROL) 10 MG tablet TAKE 1 TABLET (10 MG TOTAL) BY MOUTH 2 (TWO) TIMES DAILY BEFORE A MEAL. 200 tablet 3  . glucose blood (ONE TOUCH TEST STRIPS) test strip 1 each by Other route daily. 100 each 12  . Insulin Lispro Prot & Lispro (HUMALOG 75/25 MIX) (75-25) 100 UNIT/ML Kwikpen Inject 30 Units into the skin 1 day or 1 dose. INJECT 30 UNITS INTO THE SKIN DAILY WITH SUPPER. (Patient taking differently: Inject 25 Units into the skin  at bedtime. ) 2 pen 0  . Insulin Pen Needle 32G X 4 MM MISC Use once daily. Dx E11.9 100 each 3  . lisinopril (PRINIVIL,ZESTRIL) 40 MG tablet Take 1 tablet (40 mg total) by mouth daily. 100 tablet 3  . metFORMIN (GLUCOPHAGE) 1000 MG tablet TAKE 1 TABLET BY MOUTH TWICE DAILY WITH MEALS 200 tablet 3  . metFORMIN (GLUCOPHAGE) 500 MG tablet Take 0.5 tablets (250 mg total) by mouth 2 (two) times daily with a meal. 30 tablet 3  . omeprazole (PRILOSEC) 20 MG capsule TAKE ONE CAPSULE BY MOUTH EVERY DAY 90 capsule 3  . spironolactone (ALDACTONE) 25 MG tablet Take 1 tablet (25 mg total) by mouth daily. 30 tablet 0  . nitroGLYCERIN (NITROSTAT) 0.4 MG SL tablet PLACE 1 TABLET (0.4 MG TOTAL) UNDER THE TONGUE EVERY 5 (FIVE) MINUTES AS NEEDED. (Patient not taking: Reported on 10/05/2015) 25 tablet 0   No current facility-administered medications on file prior to visit.    BP 158/72 mmHg  Pulse 64  Temp(Src) 97.7 F (36.5 C)  Wt 255 lb (115.667 kg)       Objective:   Physical Exam  Constitutional: He is oriented to person, place, and time. He appears well-developed and well-nourished. No distress.  Neck: Normal range of motion. Neck supple.  Cardiovascular: Normal rate, regular rhythm, normal heart sounds and intact distal pulses.  Exam reveals no gallop and no friction rub.   No murmur heard. Pulmonary/Chest: Effort normal and breath sounds normal. No respiratory distress. He has no wheezes. He has no rales. He exhibits no tenderness.  Musculoskeletal: Normal range of motion. He exhibits no edema or tenderness.  Neurological: He is alert and oriented to person, place, and time.  Skin: Skin is warm and dry. No rash noted. He is not diaphoretic. No erythema. No pallor.  Psychiatric: He has a normal mood and affect. His behavior is normal. Judgment and thought content normal.  Nursing note and vitals reviewed.      Assessment & Plan:  1. Type I diabetes mellitus with complication, uncontrolled  ([place]) -Restart taking Metformin 100mg  twice a day. Restart the Glipizide and restart your insulin. Start with 15 units. Monitor your blood sugars and adjust the insulin as needed. You can go up or down by 5 units at a time.  - Basic metabolic panel - Follow up in two weeks or sooner if blood sugars are low  or severely high 2. Bilateral edema of lower extremity -Continue with current therapy.  - Increase fluid intake one additional liter.

## 2015-10-05 NOTE — Patient Instructions (Signed)
It was great seeing you again.   Restart taking Metformin 100mg  twice a day. Restart the Glipizide and restart your insulin. Start with 15 units. Monitor your blood sugars and adjust the insulin as needed. You can go up or down by 5 units at a time.   We will follow up with this during your next visit.   If you need anything, please let me know.

## 2015-10-10 ENCOUNTER — Encounter: Payer: Self-pay | Admitting: Cardiology

## 2015-10-10 ENCOUNTER — Telehealth: Payer: Self-pay | Admitting: Cardiology

## 2015-10-12 NOTE — Telephone Encounter (Signed)
Close encounter 

## 2015-10-17 ENCOUNTER — Other Ambulatory Visit: Payer: Self-pay | Admitting: Family Medicine

## 2015-10-19 ENCOUNTER — Ambulatory Visit (INDEPENDENT_AMBULATORY_CARE_PROVIDER_SITE_OTHER): Payer: Medicare Other | Admitting: Adult Health

## 2015-10-19 ENCOUNTER — Other Ambulatory Visit: Payer: Self-pay | Admitting: Family Medicine

## 2015-10-19 ENCOUNTER — Encounter: Payer: Self-pay | Admitting: Adult Health

## 2015-10-19 VITALS — BP 120/60 | HR 67 | Temp 97.5°F | Resp 16 | Ht 66.5 in | Wt 253.2 lb

## 2015-10-19 DIAGNOSIS — E1065 Type 1 diabetes mellitus with hyperglycemia: Secondary | ICD-10-CM

## 2015-10-19 DIAGNOSIS — E108 Type 1 diabetes mellitus with unspecified complications: Secondary | ICD-10-CM

## 2015-10-19 DIAGNOSIS — Z23 Encounter for immunization: Secondary | ICD-10-CM | POA: Diagnosis not present

## 2015-10-19 DIAGNOSIS — I6523 Occlusion and stenosis of bilateral carotid arteries: Secondary | ICD-10-CM

## 2015-10-19 DIAGNOSIS — Z7189 Other specified counseling: Secondary | ICD-10-CM | POA: Diagnosis not present

## 2015-10-19 DIAGNOSIS — I10 Essential (primary) hypertension: Secondary | ICD-10-CM

## 2015-10-19 DIAGNOSIS — Z7689 Persons encountering health services in other specified circumstances: Secondary | ICD-10-CM

## 2015-10-19 DIAGNOSIS — IMO0002 Reserved for concepts with insufficient information to code with codable children: Secondary | ICD-10-CM

## 2015-10-19 NOTE — Progress Notes (Signed)
Pre visit review using our clinic review tool, if applicable. No additional management support is needed unless otherwise documented below in the visit note. 

## 2015-10-19 NOTE — Progress Notes (Signed)
HPI:  [name] is here to establish care. He is a pleasant caucasian male who  has a past medical history of Diabetes mellitus; GERD (gastroesophageal reflux disease); Hyperlipidemia; Hypertension; CVA (cerebral infarction) (2001, 2004); CAD (coronary artery disease); Carotid artery occlusion; Peripheral vascular disease ([place]) (2001); History of blood transfusion; Stroke [facility]); OSA (obstructive sleep apnea) (07/20/2015); and Acute diastolic congestive heart failure ([place]) (09/20/2015).  Last PCP and physical: 08/2014 - with MD Sherren Mocha Immunizations: UTD Diet: Is eating healthy and watching portion size Exercise: No formal exercise.  Colonoscopy: Due 2017 - 5 year plan   Has the following chronic problems that require follow up and concerns today:  Diabetes Type I -His blood sugars are still up and down. During the morning and afternoon his blood sugars have been in the 120-150's and in the evening his blood sugars have been in the 200-250's. He has not been using his insulin for fear of hypoglycemia. He denies feeling hypo/hyperglycemic.   HTN - He feels in his blood pressure is well controlled on current medication regimen. Ideally he would like to start decreasing some of his blood pressure medication as he loses more weight.  He has lost 25 pounds since his hospital admission in September 2016. He endorses portion control and eating more healthy meals.  Wt Readings from Last 3 Encounters:  10/19/15 253 lb 3.2 oz (114.851 kg)  10/05/15 255 lb (115.667 kg)  09/26/15 255 lb (115.667 kg)     ROS negative for unless reported above: fevers, chills,feeling poorly, unintentional weight loss, hearing or vision loss, chest pain, palpitations, leg claudication, struggling to breath,Not feeling congested in the chest, no orthopenia, no cough,no wheezing, normal appetite, no soft tissue swelling, no hemoptysis, melena, hematochezia, hematuria, falls, loc, si, or thoughts of self harm.        Past Medical History  Diagnosis Date  . Diabetes mellitus   . GERD (gastroesophageal reflux disease)   . Hyperlipidemia   . Hypertension   . CVA (cerebral infarction) 2001, 2004  . CAD (coronary artery disease)     a. 06/2007 PCI LAD->Cypher DES;  b. 02/2010 Cath LM 30-40, LAD 40-100m, patent stent, D2 50, RI 70 ost, LCX 50, OM 30, RCA 30-76m, 30d, PDA 50, RPL 40-50p, nl EF.  . Carotid artery occlusion     a. 07/2011 L CEA;  b. 02/2012 Carotid U/S: RICA [id number], LICA patent.  . Peripheral vascular disease ([place]) 2001  . History of blood transfusion   . Stroke ([place])   . OSA (obstructive sleep apnea) 07/20/2015  . Acute diastolic congestive heart failure ([place]) 09/20/2015    Past Surgical History  Procedure Laterality Date  . Angioplasty  2008, 2010    stent placement 2008  . Kidney stone removal  1969  . Inguinal hernia repair  1962    right  . Pr vein bypass graft,aorto-fem-pop  2008  . Carotid endarterectomy Left Aug. 24, 2012    cea  . Heart stent  06-30-07    Family History  Problem Relation Age of Onset  . Heart disease Mother     CHF Heart Disease before age 26  . Heart disease Father     Heart Disease before age 4  . Cancer Brother     Lung  . Cancer Brother     Lung  . COPD Brother     Social History   Social History  . Marital Status: Married    Spouse Name: N/A  . Number  of Children: N/A  . Years of Education: N/A   Occupational History  . Medical illustrator    Social History Main Topics  . Smoking status: Never Smoker   . Smokeless tobacco: Never Used  . Alcohol Use: No  . Drug Use: No  . Sexual Activity: Not on file   Other Topics Concern  . Not on file   Social History Narrative     Current outpatient prescriptions:  .  amLODipine (NORVASC) 10 MG tablet, TAKE 1 TABLET BY MOUTH ONCE DAILY, Disp: 90 tablet, Rfl: 3 .  aspirin 325 MG tablet, Take 325 mg by mouth daily. , Disp: , Rfl:  .  atenolol (TENORMIN) 100 MG tablet, TAKE 1 TABLET  TWICE A DAY, Disp: 200 tablet, Rfl: 3 .  atorvastatin (LIPITOR) 40 MG tablet, TAKE 1 TABLET (40 MG TOTAL) BY MOUTH DAILY., Disp: 90 tablet, Rfl: 2 .  furosemide (LASIX) 40 MG tablet, Take 1 tablet (40 mg total) by mouth daily., Disp: 30 tablet, Rfl: 0 .  glipiZIDE (GLUCOTROL) 10 MG tablet, TAKE 1 TABLET (10 MG TOTAL) BY MOUTH 2 (TWO) TIMES DAILY BEFORE A MEAL., Disp: 200 tablet, Rfl: 3 .  Insulin Pen Needle 32G X 4 MM MISC, Use once daily. Dx E11.9, Disp: 100 each, Rfl: 3 .  lisinopril (PRINIVIL,ZESTRIL) 40 MG tablet, Take 1 tablet (40 mg total) by mouth daily., Disp: 100 tablet, Rfl: 3 .  metFORMIN (GLUCOPHAGE) 1000 MG tablet, TAKE 1 TABLET BY MOUTH TWICE DAILY WITH MEALS, Disp: 200 tablet, Rfl: 3 .  nitroGLYCERIN (NITROSTAT) 0.4 MG SL tablet, PLACE 1 TABLET (0.4 MG TOTAL) UNDER THE TONGUE EVERY 5 (FIVE) MINUTES AS NEEDED., Disp: 25 tablet, Rfl: 0 .  omeprazole (PRILOSEC) 20 MG capsule, TAKE ONE CAPSULE BY MOUTH EVERY DAY, Disp: 90 capsule, Rfl: 3 .  ONE TOUCH ULTRA TEST test strip, TEST EVERY DAY, Disp: 100 each, Rfl: 3 .  spironolactone (ALDACTONE) 25 MG tablet, Take 1 tablet (25 mg total) by mouth daily., Disp: 30 tablet, Rfl: 0 .  Insulin Lispro Prot & Lispro (HUMALOG 75/25 MIX) (75-25) 100 UNIT/ML Kwikpen, Inject 30 Units into the skin 1 day or 1 dose. INJECT 30 UNITS INTO THE SKIN DAILY WITH SUPPER. (Patient not taking: Reported on 10/19/2015), Disp: 2 pen, Rfl: 0  EXAM:  There were no vitals filed for this visit.  There is no weight on file to calculate BMI.  GENERAL: vitals reviewed and listed above, alert, oriented, appears well hydrated and in no acute distress. Obese around abdomen  HEENT: atraumatic, conjunttiva clear, no obvious abnormalities on inspection of external nose and ears  NECK: Neck is soft and supple without masses, no adenopathy or thyromegaly, trachea midline, no JVD. Normal range of motion.   LUNGS: clear to auscultation bilaterally, no wheezes, rales or rhonchi,  good air movement  CV: Regular rate and rhythm, normal S1/S2, no audible murmurs, gallops, or rubs. No carotid bruit. Bilateral lower extremity +1 peripheral edema.   MS: moves all extremities without noticeable abnormality. No edema noted  Abd: soft/nontender/nondistended/normal bowel sounds.Obese   Skin: warm and dry, no rash   Extremities: No clubbing, cyanosis, or edema. Capillary refill is WNL. Pulses intact bilaterally in upper and lower extremities.   Neuro: CN II-XII intact, sensation and reflexes normal throughout, 5/5 muscle strength in bilateral upper and lower extremities. Normal finger to nose. Normal rapid alternating movements.  PSYCH: pleasant and cooperative, no obvious depression or anxiety  ASSESSMENT AND PLAN:  1. Encounter to establish  care with new doctor -Follow-up in December for Medicare wellness exam -Follow up sooner if needed -He is making great progress in weight loss; continue working on portion control and eating a heart healthy diet.  2. Essential hypertension -Controlled with his current medication regimen. Blood pressure in the office today was 120/60 -Consider decreasing lisinopril to 20 mg at next visit  3. Type I diabetes mellitus with complication, uncontrolled ([place]) - Continue with current medications - Add sliding scale Humalog - Continue to monitor.  -Follow up A1c in December 4. Need for prophylactic vaccination against Streptococcus pneumoniae (pneumococcus)  - Pneumococcal polysaccharide vaccine 23-valent greater than or equal to 2yo subcutaneous/IM  5. Encounter for immunization - High Dose Flu vaccination given    Discussed the following assessment and plan:  No diagnosis found. -We reviewed the PMH, PSH, FH, SH, Meds and Allergies. -We provided refills for any medications we will prescribe as needed. -We addressed current concerns per orders and patient instructions. -We have asked for records for pertinent exams, studies,  vaccines and notes from previous providers. -We have advised patient to follow up per instructions below.   -Patient advised to return or notify a provider immediately if symptoms worsen or persist or new concerns arise.  There are no Patient Instructions on file for this visit.   [employer]

## 2015-10-19 NOTE — Patient Instructions (Addendum)
It was great seeing you again!  Follow up with me in December for your complete physical.   Use the sliding scale if needed for your insulin regimen. Continue to monitor blood sugars.   If you need anything, please let me know.   Continue to work on diet and weight loss. You are doing an amazing job

## 2015-10-23 ENCOUNTER — Other Ambulatory Visit: Payer: Self-pay | Admitting: Family Medicine

## 2015-11-21 ENCOUNTER — Other Ambulatory Visit: Payer: Self-pay | Admitting: Family Medicine

## 2015-11-30 ENCOUNTER — Other Ambulatory Visit: Payer: Self-pay | Admitting: Family Medicine

## 2015-12-19 ENCOUNTER — Ambulatory Visit: Payer: Medicare Other | Admitting: Cardiology

## 2015-12-20 ENCOUNTER — Encounter: Payer: Self-pay | Admitting: Adult Health

## 2015-12-20 ENCOUNTER — Encounter: Payer: Medicare Other | Admitting: Adult Health

## 2015-12-20 ENCOUNTER — Ambulatory Visit (INDEPENDENT_AMBULATORY_CARE_PROVIDER_SITE_OTHER): Payer: Medicare Other | Admitting: Adult Health

## 2015-12-20 VITALS — BP 120/60 | Temp 98.1°F | Ht 66.5 in | Wt 255.8 lb

## 2015-12-20 DIAGNOSIS — E785 Hyperlipidemia, unspecified: Secondary | ICD-10-CM | POA: Diagnosis not present

## 2015-12-20 DIAGNOSIS — E109 Type 1 diabetes mellitus without complications: Secondary | ICD-10-CM

## 2015-12-20 DIAGNOSIS — E108 Type 1 diabetes mellitus with unspecified complications: Secondary | ICD-10-CM

## 2015-12-20 DIAGNOSIS — E1065 Type 1 diabetes mellitus with hyperglycemia: Secondary | ICD-10-CM | POA: Diagnosis not present

## 2015-12-20 DIAGNOSIS — E669 Obesity, unspecified: Secondary | ICD-10-CM

## 2015-12-20 DIAGNOSIS — IMO0002 Reserved for concepts with insufficient information to code with codable children: Secondary | ICD-10-CM

## 2015-12-20 DIAGNOSIS — I10 Essential (primary) hypertension: Secondary | ICD-10-CM

## 2015-12-20 DIAGNOSIS — Z0000 Encounter for general adult medical examination without abnormal findings: Secondary | ICD-10-CM

## 2015-12-20 DIAGNOSIS — I2510 Atherosclerotic heart disease of native coronary artery without angina pectoris: Secondary | ICD-10-CM

## 2015-12-20 LAB — BASIC METABOLIC PANEL
BUN: 28 mg/dL — ABNORMAL HIGH (ref 6–23)
CO2: 28 mEq/L (ref 19–32)
Calcium: 9.7 mg/dL (ref 8.4–10.5)
Chloride: 102 mEq/L (ref 96–112)
Creatinine, Ser: 1.04 mg/dL (ref 0.40–1.50)
GFR: 74.11 mL/min (ref >60.00)
Glucose, Bld: 138 mg/dL — ABNORMAL HIGH (ref 70–99)
Potassium: 4.5 mEq/L (ref 3.5–5.1)
Sodium: 141 mEq/L (ref 135–145)

## 2015-12-20 LAB — CBC WITH DIFFERENTIAL/PLATELET
Basophils Absolute: 0 10*3/uL (ref 0.0–0.1)
Basophils Relative: 0.4 % (ref 0.0–3.0)
Eosinophils Absolute: 0.3 10*3/uL (ref 0.0–0.7)
Eosinophils Relative: 2.7 % (ref 0.0–5.0)
HCT: 40.6 % (ref 39.0–52.0)
Hemoglobin: 13.2 g/dL (ref 13.0–17.0)
Lymphocytes Relative: 25.8 % (ref 12.0–46.0)
Lymphs Abs: 2.7 10*3/uL (ref 0.7–4.0)
MCHC: 32.5 g/dL (ref 30.0–36.0)
MCV: 90.4 fl (ref 78.0–100.0)
Monocytes Absolute: 0.9 10*3/uL (ref 0.1–1.0)
Monocytes Relative: 8.8 % (ref 3.0–12.0)
Neutro Abs: 6.6 10*3/uL (ref 1.4–7.7)
Neutrophils Relative %: 62.3 % (ref 43.0–77.0)
Platelets: 273 10*3/uL (ref 150.0–400.0)
RBC: 4.49 Mil/uL (ref 4.22–5.81)
RDW: 16.3 % — ABNORMAL HIGH (ref 11.5–15.5)
WBC: 10.6 10*3/uL — ABNORMAL HIGH (ref 4.0–10.5)

## 2015-12-20 LAB — POCT URINALYSIS DIPSTICK
Bilirubin, UA: NEGATIVE
Blood, UA: NEGATIVE
Glucose, UA: NEGATIVE
Ketones, UA: NEGATIVE
Leukocytes, UA: NEGATIVE
Nitrite, UA: NEGATIVE
Protein, UA: NEGATIVE
Spec Grav, UA: 1.015
Urobilinogen, UA: 0.2
pH, UA: 5

## 2015-12-20 LAB — HEMOGLOBIN A1C: Hgb A1c MFr Bld: 8.6 % — ABNORMAL HIGH (ref 4.6–6.5)

## 2015-12-20 LAB — HEPATIC FUNCTION PANEL
ALT: 14 U/L (ref 0–53)
AST: 14 U/L (ref 0–37)
Albumin: 4.1 g/dL (ref 3.5–5.2)
Alkaline Phosphatase: 72 U/L (ref 39–117)
Bilirubin, Direct: 0.2 mg/dL (ref 0.0–0.3)
Total Bilirubin: 0.9 mg/dL (ref 0.2–1.2)
Total Protein: 6.9 g/dL (ref 6.0–8.3)

## 2015-12-20 LAB — LIPID PANEL
Cholesterol: 127 mg/dL (ref 0–200)
HDL: 44.1 mg/dL (ref >39.00)
LDL Cholesterol: 61 mg/dL (ref 0–99)
NonHDL: 82.77
Total CHOL/HDL Ratio: 3
Triglycerides: 108 mg/dL (ref 0.0–149.0)
VLDL: 21.6 mg/dL (ref 0.0–40.0)

## 2015-12-20 LAB — TSH: TSH: 2.43 u[IU]/mL (ref 0.35–4.50)

## 2015-12-20 MED ORDER — AMLODIPINE BESYLATE 10 MG PO TABS: 10.0000 mg | ORAL_TABLET | Freq: Every day | ORAL | Status: DC

## 2015-12-20 MED ORDER — INSULIN PEN NEEDLE 32G X 4 MM MISC: Status: DC

## 2015-12-20 MED ORDER — GLIPIZIDE 10 MG PO TABS: ORAL_TABLET | ORAL | Status: DC

## 2015-12-20 MED ORDER — METFORMIN HCL 1000 MG PO TABS: 1000.0000 mg | ORAL_TABLET | Freq: Two times a day (BID) | ORAL | Status: DC

## 2015-12-20 MED ORDER — LISINOPRIL 40 MG PO TABS: 40.0000 mg | ORAL_TABLET | Freq: Every day | ORAL | Status: DC

## 2015-12-20 MED ORDER — INSULIN LISPRO PROT & LISPRO (75-25 MIX) 100 UNIT/ML KWIKPEN: 30.0000 [IU] | PEN_INJECTOR | SUBCUTANEOUS | Status: DC

## 2015-12-20 MED ORDER — NITROGLYCERIN 0.4 MG SL SUBL: SUBLINGUAL_TABLET | SUBLINGUAL | Status: DC

## 2015-12-20 MED ORDER — ATORVASTATIN CALCIUM 40 MG PO TABS
40.0000 mg | ORAL_TABLET | Freq: Every day | ORAL | Status: DC
Start: 2015-12-20 — End: 2016-08-29

## 2015-12-20 MED ORDER — ATENOLOL 100 MG PO TABS: 100.0000 mg | ORAL_TABLET | Freq: Two times a day (BID) | ORAL | Status: DC

## 2015-12-20 MED ORDER — SPIRONOLACTONE 25 MG PO TABS: 25.0000 mg | ORAL_TABLET | Freq: Every day | ORAL | Status: DC

## 2015-12-20 MED ORDER — OMEPRAZOLE 20 MG PO CPDR: DELAYED_RELEASE_CAPSULE | ORAL | Status: DC

## 2015-12-20 MED ORDER — FUROSEMIDE 20 MG PO TABS: 20.0000 mg | ORAL_TABLET | Freq: Every day | ORAL | Status: DC

## 2015-12-20 MED ORDER — FUROSEMIDE 40 MG PO TABS: 40.0000 mg | ORAL_TABLET | Freq: Every day | ORAL | Status: DC

## 2015-12-20 NOTE — Progress Notes (Signed)
Subjective:  Patient presents today for their annual wellness visit.     Preventive Screening-Counseling & Management  Smoking Status: Never Smoker Second Hand Smoking status: No smokers in home  Risk Factors Regular exercise: Does not exercise regularly.  Diet: Does not follow a diet Fall Risk: None   Cardiac risk factors:  advanced age (older than 73 for men, 65 for women)  Hyperlipidemia  diabetes.  Family History:  Heart disease with mom and dad Depression Screen None. 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   Cognitive Testing No reported trouble.   Normal 3 word recall  List the Names of Other Physician/Practitioners you currently use: 1.Cardiology - Kirk Ruths 2. Optho Dre[facility]  Immunization History  Administered Date(s) Administered  . Influenza Split 09/19/2011, 09/07/2012  . Influenza Whole 10/15/2007, 10/13/2008, 11/06/2009  . Influenza, High Dose Seasonal PF 10/19/2015  . Influenza,inj,Quad PF,36+ Mos 12/15/2013, 09/13/2014  . Pneumococcal Conjugate-13 09/13/2014  . Pneumococcal Polysaccharide-23 10/19/2015  . Td 12/30/1997, 07/07/2008  . Zoster 09/14/2011   Required Immunizations needed today: None  Screening tests- up to date Health Maintenance Due  Topic Date Due  . OPHTHALMOLOGY EXAM  10/31/2015    ROS- No pertinent positives discovered in course of AWV  The following were reviewed and entered/updated in epic: Past Medical History  Diagnosis Date  . Diabetes mellitus   . GERD (gastroesophageal reflux disease)   . Hyperlipidemia   . Hypertension   . CVA (cerebral infarction) 2001, 2004  . CAD (coronary artery disease)     a. 06/2007 PCI LAD->Cypher DES;  b. 02/2010 Cath LM 30-40, LAD 40-49m, patent stent, D2 50, RI 70 ost, LCX 50, OM 30, RCA 30-35m, 30d, PDA 50, RPL 40-50p,  nl EF.  . Carotid artery occlusion     a. 07/2011 L CEA;  b. 02/2012 Carotid U/S: RICA [id number], LICA patent.  . Peripheral vascular disease ([place]) 2001  . History of blood transfusion   . OSA (obstructive sleep apnea) 07/20/2015  . Acute diastolic congestive heart failure ([place]) 09/20/2015  . Chronic back pain    Patient Active Problem List   Diagnosis Date Noted  . Acute diastolic congestive heart failure ([place]) 09/20/2015  . Hypotension 09/09/2015  . CAP (community acquired pneumonia) 09/09/2015  . Leukocytosis 09/09/2015  . Blood poisoning ([place])   . Sepsis ([place]) 09/08/2015  . OSA (obstructive sleep apnea) 07/20/2015  . Dyspnea 04/26/2015  . Cerebrovascular disease 04/26/2015  . Type I diabetes mellitus with complication, uncontrolled ([place]) 10/10/2014  . Occlusion and stenosis of carotid artery without mention of cerebral infarction 03/18/2012  . Status post carotid endarterectomy 03/04/2012  . Carotid bruit 08/14/2011  . PREMATURE BEATS 11/12/2010  . ANGINA, STABLE/EXERTIONAL 03/21/2010  . Hyperlipidemia 03/15/2010  . Obesity 03/15/2010  . CAD, NATIVE VESSEL 03/15/2010  . BRADYCARDIA 03/15/2010  . CEREBROVASCULAR ACCIDENT, HX OF 07/07/2008  . ERECTILE DYSFUNCTION 05/28/2007  . Essential hypertension 05/28/2007   Past Surgical History  Procedure Laterality Date  . Angioplasty  2008, 2010    stent placement 2008  . Kidney stone removal  1969  . Inguinal hernia repair  1962    right  . Pr vein bypass graft,aorto-fem-pop  2008  . Carotid endarterectomy Left Aug. 24, 2012    cea  . Heart stent  06-30-07  Family History  Problem Relation Age of Onset  . Heart disease Mother     CHF Heart Disease before age 82  . Heart disease Father     Heart Disease before age 12  . Cancer Brother     Lung  . Cancer Brother     Lung  . COPD Brother     Medications- reviewed and updated Current Outpatient Prescriptions  Medication Sig Dispense Refill  . amLODipine (NORVASC) 10 MG  tablet TAKE 1 TABLET BY MOUTH ONCE DAILY 90 tablet 3  . aspirin 325 MG tablet Take 325 mg by mouth daily.     [name] atenolol (TENORMIN) 100 MG tablet TAKE 1 TABLET TWICE A DAY 200 tablet 1  . atorvastatin (LIPITOR) 40 MG tablet TAKE 1 TABLET (40 MG TOTAL) BY MOUTH DAILY. 90 tablet 2  . furosemide (LASIX) 40 MG tablet Take 1 tablet (40 mg total) by mouth daily. 30 tablet 0  . glipiZIDE (GLUCOTROL) 10 MG tablet TAKE 1 TABLET (10 MG TOTAL) BY MOUTH 2 (TWO) TIMES DAILY BEFORE A MEAL. 200 tablet 3  . Insulin Lispro Prot & Lispro (HUMALOG 75/25 MIX) (75-25) 100 UNIT/ML Kwikpen Inject 30 Units into the skin 1 day or 1 dose. INJECT 30 UNITS INTO THE SKIN DAILY WITH SUPPER. 2 pen 0  . Insulin Pen Needle 32G X 4 MM MISC Use once daily. Dx E11.9 100 each 3  . lisinopril (PRINIVIL,ZESTRIL) 40 MG tablet Take 1 tablet (40 mg total) by mouth daily. 100 tablet 3  . metFORMIN (GLUCOPHAGE) 1000 MG tablet TAKE 1 TABLET BY MOUTH TWICE DAILY WITH MEALS 200 tablet 0  . nitroGLYCERIN (NITROSTAT) 0.4 MG SL tablet PLACE 1 TABLET (0.4 MG TOTAL) UNDER THE TONGUE EVERY 5 (FIVE) MINUTES AS NEEDED. 25 tablet 0  . omeprazole (PRILOSEC) 20 MG capsule TAKE ONE CAPSULE BY MOUTH EVERY DAY 90 capsule 3  . ONE TOUCH ULTRA TEST test strip TEST EVERY DAY 100 each 3  . spironolactone (ALDACTONE) 25 MG tablet TAKE 1 TABLET BY MOUTH ONCE DAILY 90 tablet 2   No current facility-administered medications for this visit.    Allergies-reviewed and updated Allergies  Allergen Reactions  . Clopidogrel Bisulfate Itching and Rash    REACTION: rash, itching from Head to Toe   -  PLAVIX   . Adhesive [Tape] Rash    rash    Social History   Social History  . Marital Status: Married    Spouse Name: N/A  . Number of Children: N/A  . Years of Education: N/A   Occupational History  . Medical illustrator    Social History Main Topics  . Smoking status: Never Smoker   . Smokeless tobacco: Never Used  . Alcohol Use: No  . Drug Use: No  .  Sexual Activity: Not Asked   Other Topics Concern  . None   Social History Narrative   Works part time as an Medical illustrator.   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    One son who lives in [place] - son is adopted.    She takes care of his wife, she is oxygen continuously.     Objective: BP 120/60 mmHg  Temp(Src) 98.1 F (36.7 C) (Oral)  Ht 5' 6.5" (1.689 m)  Wt 255 lb 12.8 oz (116.03 kg)  BMI 40.67 kg/m2  GENERAL: vitals reviewed and listed above, alert, oriented, appears well hydrated and in no acute distress. Obese around abdomen  HEENT: atraumatic, conjunttiva clear, no obvious abnormalities on inspection of  external nose and ears  NECK: Neck is soft and supple without masses, no adenopathy or thyromegaly, trachea midline, no JVD. Normal range of motion.   LUNGS: clear to auscultation bilaterally, no wheezes, rales or rhonchi, good air movement  CV: Regular rate and rhythm, normal S1/S2, no audible murmurs, gallops, or rubs. No carotid bruit. Bilateral lower extremity +1 peripheral edema.   MS: moves all extremities without noticeable abnormality. No edema noted  Abd: soft/nontender/nondistended/normal bowel sounds.Obese   Skin: warm and dry, no rash   Rectal: Prostate smooth and symmetrical, normal rectal tone, guaiac negative.   Extremities: No clubbing, cyanosis, or edema. Capillary refill is WNL. Pulses intact bilaterally in upper and lower extremities.   Neuro: CN II-XII intact, sensation and reflexes normal throughout, 5/5 muscle strength in bilateral upper and lower extremities. Normal finger to nose. Normal rapid alternating movements.  PSYCH: pleasant and cooperative, no obvious depression or anxiety Assessment/Plan:  1. Hyperlipidemia - Basic metabolic panel - CBC with Differential/Platelet - Hemoglobin A1c - Hepatic function panel - Lipid panel - POCT urinalysis dipstick - TSH  2. Essential hypertension - lisinopril (PRINIVIL,ZESTRIL) 40 MG tablet; Take 1  tablet (40 mg total) by mouth daily.  Dispense: 100 tablet; Refill: 3 - Basic metabolic panel - CBC with Differential/Platelet - Hemoglobin A1c - Hepatic function panel - Lipid panel - POCT urinalysis dipstick - TSH - Continue to monitor at home  3. Type I diabetes mellitus with complication, uncontrolled (HCC)  - Insulin Lispro Prot & Lispro (HUMALOG 75/25 MIX) (75-25) 100 UNIT/ML Kwikpen; Inject 30 Units into the skin 1 day or 1 dose. INJECT 30 UNITS INTO THE SKIN DAILY WITH SUPPER.  Dispense: 2 pen; Refill: 3 - Basic metabolic panel - CBC with Differential/Platelet - Hemoglobin A1c - Hepatic function panel - Lipid panel - POCT urinalysis dipstick - TSH - Continue to monitor at home 4. OBESITY  - Basic metabolic panel - CBC with Differential/Platelet - Hemoglobin A1c - Hepatic function panel - Lipid panel - POCT urinalysis dipstick - TSH - He understands that he needs to work on diet and exercise  5. Atherosclerosis of native coronary artery of native heart without angina pectoris  - nitroGLYCERIN (NITROSTAT) 0.4 MG SL tablet; PLACE 1 TABLET (0.4 MG TOTAL) UNDER THE TONGUE EVERY 5 (FIVE) MINUTES AS NEEDED.  Dispense: 25 tablet; Refill: 0 - Basic metabolic panel - CBC with Differential/Platelet - Hemoglobin A1c - Hepatic function panel - Lipid panel - POCT urinalysis dipstick - TSH    Return precautions advised.

## 2015-12-20 NOTE — Patient Instructions (Signed)
.    Mr. Pharmacist, community , Thank you for taking time to come for your Medicare Wellness Visit. I appreciate your ongoing commitment to your health goals. Please review the following plan we discussed and let me know if I can assist you in the future.   These are the goals we discussed: Goals    . Decrease soda or juice intake    . Increase physical activity    . Increase water intake       This is a list of the screening recommended for you and due dates:  Health Maintenance  Topic Date Due  . Eye exam for diabetics  10/31/2015  . Hemoglobin A1C  03/08/2016  . Complete foot exam   04/16/2016  . Flu Shot  07/30/2016  . Colon Cancer Screening  10/23/2016  . Tetanus Vaccine  07/07/2018  . Shingles Vaccine  Completed  . Pneumonia vaccines  Completed

## 2015-12-20 NOTE — Progress Notes (Signed)
Pre visit review using our clinic review tool, if applicable. No additional management support is needed unless otherwise documented below in the visit note. 

## 2015-12-28 ENCOUNTER — Encounter: Payer: Self-pay | Admitting: *Deleted

## 2016-01-02 NOTE — Progress Notes (Signed)
HPI: FU CAD; s/p Cypher DES to the LAD 7/08. ETT-Myoview 02/04/13: Patient exercised for 4 minutes and complained of chest pain at peak exercise. ECG was felt to be abnormal with post exercise downsloping ST segments in the inferolateral leads. Images were normal with no evidence of ischemia, EF 59%. Also with chronic edema and cerebrovascular disease (followed by vascular surgery). Abd ultrasound 5/16 showed no aneurysm. Echo 9/16 showed normal LV function, mild LAE. Admitted 9/16 with pneumonia and CHF. At last ov, lasix decreased. Since last seen, He has some dyspnea on exertion but denies orthopnea, PND, palpitations, exertional chest pain or syncope. Chronic mild pedal edema but improved.  Current Outpatient Prescriptions  Medication Sig Dispense Refill  . amLODipine (NORVASC) 10 MG tablet Take 1 tablet (10 mg total) by mouth daily. 90 tablet 3  . aspirin 325 MG tablet Take 325 mg by mouth daily.     [name] atenolol (TENORMIN) 100 MG tablet Take 1 tablet (100 mg total) by mouth 2 (two) times daily. 200 tablet 1  . atorvastatin (LIPITOR) 40 MG tablet Take 1 tablet (40 mg total) by mouth daily at 6 PM. 90 tablet 3  . furosemide (LASIX) 20 MG tablet Take 1 tablet (20 mg total) by mouth daily. 90 tablet 3  . furosemide (LASIX) 40 MG tablet Take 1 tablet (40 mg total) by mouth daily. 90 tablet 3  . glipiZIDE (GLUCOTROL) 10 MG tablet TAKE 1 TABLET (10 MG TOTAL) BY MOUTH 2 (TWO) TIMES DAILY BEFORE A MEAL. 200 tablet 3  . Insulin Lispro Prot & Lispro (HUMALOG 75/25 MIX) (75-25) 100 UNIT/ML Kwikpen Inject 30 Units into the skin 1 day or 1 dose. INJECT 30 UNITS INTO THE SKIN DAILY WITH SUPPER. 2 pen 3  . Insulin Pen Needle 32G X 4 MM MISC Use once daily. Dx E11.9 100 each 3  . lisinopril (PRINIVIL,ZESTRIL) 40 MG tablet Take 1 tablet (40 mg total) by mouth daily. 100 tablet 3  . metFORMIN (GLUCOPHAGE) 1000 MG tablet Take 1 tablet (1,000 mg total) by mouth 2 (two) times daily with a meal. 200 tablet 0  .  nitroGLYCERIN (NITROSTAT) 0.4 MG SL tablet PLACE 1 TABLET (0.4 MG TOTAL) UNDER THE TONGUE EVERY 5 (FIVE) MINUTES AS NEEDED. 25 tablet 0  . omeprazole (PRILOSEC) 20 MG capsule TAKE ONE CAPSULE BY MOUTH EVERY DAY 90 capsule 3  . ONE TOUCH ULTRA TEST test strip TEST EVERY DAY 100 each 3  . spironolactone (ALDACTONE) 25 MG tablet Take 1 tablet (25 mg total) by mouth daily. 90 tablet 3   No current facility-administered medications for this visit.     Past Medical History  Diagnosis Date  . Diabetes mellitus   . GERD (gastroesophageal reflux disease)   . Hyperlipidemia   . Hypertension   . CVA (cerebral infarction) 2001, 2004  . CAD (coronary artery disease)     a. 06/2007 PCI LAD->Cypher DES;  b. 02/2010 Cath LM 30-40, LAD 40-87m, patent stent, D2 50, RI 70 ost, LCX 50, OM 30, RCA 30-30m, 30d, PDA 50, RPL 40-50p, nl EF.  . Carotid artery occlusion     a. 07/2011 L CEA;  b. 02/2012 Carotid U/S: RICA [id number], LICA patent.  . Peripheral vascular disease ([place]) 2001  . History of blood transfusion   . OSA (obstructive sleep apnea) 07/20/2015  . Acute diastolic congestive heart failure ([place]) 09/20/2015  . Chronic back pain     Past Surgical History  Procedure Laterality Date  .  Angioplasty  2008, 2010    stent placement 2008  . Kidney stone removal  1969  . Inguinal hernia repair  1962    right  . Pr vein bypass graft,aorto-fem-pop  2008  . Carotid endarterectomy Left Aug. 24, 2012    cea  . Heart stent  06-30-07    Social History   Social History  . Marital Status: Married    Spouse Name: N/A  . Number of Children: N/A  . Years of Education: N/A   Occupational History  . Medical illustrator    Social History Main Topics  . Smoking status: Never Smoker   . Smokeless tobacco: Never Used  . Alcohol Use: No  . Drug Use: No  . Sexual Activity: Not on file   Other Topics Concern  . Not on file   Social History Narrative   Works part time as an Medical illustrator.   Married for 48  years.    One son who lives in [place] - son is adopted.    She takes care of his wife, she is oxygen continuously.     ROS: no fevers or chills, productive cough, hemoptysis, dysphasia, odynophagia, melena, hematochezia, dysuria, hematuria, rash, seizure activity, orthopnea, PND, claudication. Remaining systems are negative.  Physical Exam: Well-developed obese in no acute distress.  Skin is warm and dry.  HEENT is normal.  Neck is supple.  Chest is clear to auscultation with normal expansion.  Cardiovascular exam is regular rate and rhythm. 1/6 systolic murmur Abdominal exam nontender or distended. No masses palpated. Extremities show n1+ edema. neuro grossly intact

## 2016-01-04 ENCOUNTER — Encounter: Payer: Self-pay | Admitting: *Deleted

## 2016-01-04 ENCOUNTER — Encounter: Payer: Self-pay | Admitting: Cardiology

## 2016-01-04 ENCOUNTER — Ambulatory Visit (INDEPENDENT_AMBULATORY_CARE_PROVIDER_SITE_OTHER): Payer: Medicare Other | Admitting: Cardiology

## 2016-01-04 VITALS — BP 138/60 | HR 70 | Ht 69.0 in | Wt 262.0 lb

## 2016-01-04 DIAGNOSIS — I679 Cerebrovascular disease, unspecified: Secondary | ICD-10-CM

## 2016-01-04 DIAGNOSIS — E785 Hyperlipidemia, unspecified: Secondary | ICD-10-CM

## 2016-01-04 DIAGNOSIS — I10 Essential (primary) hypertension: Secondary | ICD-10-CM

## 2016-01-04 DIAGNOSIS — R0600 Dyspnea, unspecified: Secondary | ICD-10-CM

## 2016-01-04 DIAGNOSIS — I5031 Acute diastolic (congestive) heart failure: Secondary | ICD-10-CM

## 2016-01-04 DIAGNOSIS — I2510 Atherosclerotic heart disease of native coronary artery without angina pectoris: Secondary | ICD-10-CM | POA: Diagnosis not present

## 2016-01-04 NOTE — Assessment & Plan Note (Signed)
Blood pressure controlled. Continue present medications. 

## 2016-01-04 NOTE — Assessment & Plan Note (Signed)
Continue aspirin and statin. Followed by vascular surgery. 

## 2016-01-04 NOTE — Assessment & Plan Note (Signed)
Symptoms have improved. Now euvolemic on examination. Continue present dose of diuretics.

## 2016-01-04 NOTE — Patient Instructions (Signed)
Medication Instructions:   NO CHANGE  Testing/Procedures:  Your physician has requested that you have a lexiscan myoview. For further information please visit [email or web address]. Please follow instruction sheet, as given.    Follow-Up:  Your physician wants you to follow-up in: 6 MONTHS WITH DR CRENSHAW You will receive a reminder letter in the mail two months in advance. If you don't receive a letter, please call our office to schedule the follow-up appointment.   If you need a refill on your cardiac medications before your next appointment, please call your pharmacy.    

## 2016-01-04 NOTE — Assessment & Plan Note (Signed)
As likely multifactorial including obesity, deconditioning, obstructive sleep apnea and chronic diastolic congestive heart failure. He did have dyspnea prior to his previous intervention. Plan nuclear study for risk stratification.

## 2016-01-04 NOTE — Assessment & Plan Note (Signed)
Continue aspirin and statin. 

## 2016-01-04 NOTE — Assessment & Plan Note (Signed)
Continue statin. 

## 2016-01-11 ENCOUNTER — Telehealth (HOSPITAL_COMMUNITY): Payer: Self-pay

## 2016-01-11 NOTE — Telephone Encounter (Signed)
Encounter complete. 

## 2016-01-16 ENCOUNTER — Ambulatory Visit (HOSPITAL_COMMUNITY)
Admission: RE | Admit: 2016-01-16 | Discharge: 2016-01-16 | Disposition: A | Discharge status: Home / Self Care | Payer: Medicare Other | Source: Ambulatory Visit | Attending: Cardiovascular Disease | Admitting: Cardiovascular Disease

## 2016-01-16 DIAGNOSIS — I517 Cardiomegaly: Secondary | ICD-10-CM | POA: Insufficient documentation

## 2016-01-16 DIAGNOSIS — R42 Dizziness and giddiness: Secondary | ICD-10-CM | POA: Diagnosis not present

## 2016-01-16 DIAGNOSIS — G4733 Obstructive sleep apnea (adult) (pediatric): Secondary | ICD-10-CM | POA: Insufficient documentation

## 2016-01-16 DIAGNOSIS — R079 Chest pain, unspecified: Secondary | ICD-10-CM | POA: Insufficient documentation

## 2016-01-16 DIAGNOSIS — I2510 Atherosclerotic heart disease of native coronary artery without angina pectoris: Secondary | ICD-10-CM | POA: Insufficient documentation

## 2016-01-16 DIAGNOSIS — I10 Essential (primary) hypertension: Secondary | ICD-10-CM | POA: Diagnosis not present

## 2016-01-16 DIAGNOSIS — E119 Type 2 diabetes mellitus without complications: Secondary | ICD-10-CM | POA: Insufficient documentation

## 2016-01-16 DIAGNOSIS — I779 Disorder of arteries and arterioles, unspecified: Secondary | ICD-10-CM | POA: Insufficient documentation

## 2016-01-16 DIAGNOSIS — Z8249 Family history of ischemic heart disease and other diseases of the circulatory system: Secondary | ICD-10-CM | POA: Diagnosis not present

## 2016-01-16 DIAGNOSIS — Z6838 Body mass index (BMI) 38.0-38.9, adult: Secondary | ICD-10-CM | POA: Diagnosis not present

## 2016-01-16 DIAGNOSIS — E669 Obesity, unspecified: Secondary | ICD-10-CM | POA: Diagnosis not present

## 2016-01-16 DIAGNOSIS — R9439 Abnormal result of other cardiovascular function study: Secondary | ICD-10-CM | POA: Diagnosis not present

## 2016-01-16 DIAGNOSIS — R0609 Other forms of dyspnea: Secondary | ICD-10-CM | POA: Insufficient documentation

## 2016-01-16 DIAGNOSIS — I739 Peripheral vascular disease, unspecified: Secondary | ICD-10-CM | POA: Insufficient documentation

## 2016-01-16 LAB — MYOCARDIAL PERFUSION IMAGING
LV dias vol: 155 mL
LV sys vol: 72 mL
Peak HR: 82 {beats}/min
Rest HR: 62 {beats}/min
SDS: 0
SRS: 5
SSS: 5
TID: 1.32

## 2016-01-16 MED ORDER — AMINOPHYLLINE 25 MG/ML IV SOLN
75.0000 mg | Freq: Once | INTRAVENOUS | Status: AC
  Administered 2016-01-16: 75 mg via INTRAVENOUS

## 2016-01-16 MED ORDER — REGADENOSON 0.4 MG/5ML IV SOLN
0.4000 mg | Freq: Once | INTRAVENOUS | Status: AC
  Administered 2016-01-16: 0.4 mg via INTRAVENOUS

## 2016-01-16 MED ORDER — TECHNETIUM TC 99M SESTAMIBI GENERIC - CARDIOLITE
32.0000 | Freq: Once | INTRAVENOUS | Status: AC | PRN
  Administered 2016-01-16: 32 via INTRAVENOUS

## 2016-01-16 MED ORDER — TECHNETIUM TC 99M SESTAMIBI GENERIC - CARDIOLITE
10.9000 | Freq: Once | INTRAVENOUS | Status: AC | PRN
  Administered 2016-01-16: 10.9 via INTRAVENOUS

## 2016-01-31 ENCOUNTER — Encounter: Payer: Self-pay | Admitting: Adult Health

## 2016-01-31 ENCOUNTER — Ambulatory Visit (INDEPENDENT_AMBULATORY_CARE_PROVIDER_SITE_OTHER): Payer: Medicare Other | Admitting: Adult Health

## 2016-01-31 ENCOUNTER — Ambulatory Visit (INDEPENDENT_AMBULATORY_CARE_PROVIDER_SITE_OTHER)
Admission: RE | Admit: 2016-01-31 | Discharge: 2016-01-31 | Disposition: A | Discharge status: Home / Self Care | Payer: Medicare Other | Source: Ambulatory Visit | Attending: Adult Health | Admitting: Adult Health

## 2016-01-31 VITALS — BP 138/68 | HR 83 | Temp 98.3°F | Ht 69.0 in | Wt 261.6 lb

## 2016-01-31 DIAGNOSIS — I2510 Atherosclerotic heart disease of native coronary artery without angina pectoris: Secondary | ICD-10-CM

## 2016-01-31 DIAGNOSIS — R05 Cough: Secondary | ICD-10-CM | POA: Diagnosis not present

## 2016-01-31 DIAGNOSIS — R0602 Shortness of breath: Secondary | ICD-10-CM

## 2016-01-31 MED ORDER — ALBUTEROL SULFATE HFA 108 (90 BASE) MCG/ACT IN AERS: 2.0000 | INHALATION_SPRAY | Freq: Four times a day (QID) | RESPIRATORY_TRACT | Status: DC | PRN

## 2016-01-31 MED ORDER — AMOXICILLIN-POT CLAVULANATE 875-125 MG PO TABS: 1.0000 | ORAL_TABLET | Freq: Two times a day (BID) | ORAL | Status: DC

## 2016-01-31 MED ORDER — IPRATROPIUM-ALBUTEROL 0.5-2.5 (3) MG/3ML IN SOLN
3.0000 mL | Freq: Once | RESPIRATORY_TRACT | Status: AC
  Administered 2016-01-31: 3 mL via RESPIRATORY_TRACT

## 2016-01-31 NOTE — Progress Notes (Signed)
Pre visit review using our clinic review tool, if applicable. No additional management support is needed unless otherwise documented below in the visit note. 

## 2016-01-31 NOTE — Patient Instructions (Signed)
It was great seeing you today   I will follow up with you about your chest xray.   Start with 10 units of the Basaglar and go up by one unit every other day until you are close to 130-140. Let me know how you are doing in a week.

## 2016-01-31 NOTE — Progress Notes (Signed)
Subjective:    Patient ID: [name], male    DOB: 1941-06-07, 75 y.o.   MRN: [id number]  HPI  75 year old male who  has a past medical history of Diabetes mellitus; GERD (gastroesophageal reflux disease); Hyperlipidemia; Hypertension; CVA (cerebral infarction) (2001, 2004); CAD (coronary artery disease); Carotid artery occlusion; Peripheral vascular disease ([place]) (2001); History of blood transfusion; OSA (obstructive sleep apnea) (07/20/2015); Acute diastolic congestive heart failure ([place]) (09/20/2015); and Chronic back pain.   Presents today with non productive cough, chest congestion and runny nose x 4 or 5 days. He reports that he has been feeling wheezy and short of breath for the same time frame  He had pneumonia last year and wants to make sure that he doesn't have it again.      Review of Systems  Constitutional: Negative.   HENT: Positive for rhinorrhea and sinus pressure. Negative for ear discharge, ear pain, postnasal drip and sore throat.   Respiratory: Positive for cough, shortness of breath and wheezing. Negative for apnea and chest tightness.   Cardiovascular: Negative.   Neurological: Negative.   All other systems reviewed and are negative.  Past Medical History  Diagnosis Date  . Diabetes mellitus   . GERD (gastroesophageal reflux disease)   . Hyperlipidemia   . Hypertension   . CVA (cerebral infarction) 2001, 2004  . CAD (coronary artery disease)     a. 06/2007 PCI LAD->Cypher DES;  b. 02/2010 Cath LM 30-40, LAD 40-38m, patent stent, D2 50, RI 70 ost, LCX 50, OM 30, RCA 30-21m, 30d, PDA 50, RPL 40-50p, nl EF.  . Carotid artery occlusion     a. 07/2011 L CEA;  b. 02/2012 Carotid U/S: RICA [id number], LICA patent.  . Peripheral vascular disease ([place]) 2001  . History of blood transfusion   . OSA (obstructive sleep apnea) 07/20/2015  . Acute diastolic congestive heart failure ([place]) 09/20/2015  . Chronic back pain     Social History   Social History  . Marital  Status: Married    Spouse Name: N/A  . Number of Children: N/A  . Years of Education: N/A   Occupational History  . Medical illustrator    Social History Main Topics  . Smoking status: Never Smoker   . Smokeless tobacco: Never Used  . Alcohol Use: No  . Drug Use: No  . Sexual Activity: Not on file   Other Topics Concern  . Not on file   Social History Narrative   Works part time as an Medical illustrator.   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    One son who lives in [place] - son is adopted.    She takes care of his wife, she is oxygen continuously.     Past Surgical History  Procedure Laterality Date  . Angioplasty  2008, 2010    stent placement 2008  . Kidney stone removal  1969  . Inguinal hernia repair  1962    right  . Pr vein bypass graft,aorto-fem-pop  2008  . Carotid endarterectomy Left Aug. 24, 2012    cea  . Heart stent  06-30-07    Family History  Problem Relation Age of Onset  . Heart disease Mother     CHF Heart Disease before age 2  . Heart disease Father     Heart Disease before age 46  . Cancer Brother     Lung  . Cancer Brother     Lung  . COPD Brother  Allergies  Allergen Reactions  . Clopidogrel Bisulfate Itching and Rash    REACTION: rash, itching from Head to Toe   -  PLAVIX   . Adhesive [Tape] Rash    rash    Current Outpatient Prescriptions on File Prior to Visit  Medication Sig Dispense Refill  . amLODipine (NORVASC) 10 MG tablet Take 1 tablet (10 mg total) by mouth daily. 90 tablet 3  . aspirin 325 MG tablet Take 325 mg by mouth daily.     [name] atenolol (TENORMIN) 100 MG tablet Take 1 tablet (100 mg total) by mouth 2 (two) times daily. 200 tablet 1  . atorvastatin (LIPITOR) 40 MG tablet Take 1 tablet (40 mg total) by mouth daily at 6 PM. 90 tablet 3  . furosemide (LASIX) 20 MG tablet Take 1 tablet (20 mg total) by mouth daily. 90 tablet 3  . furosemide (LASIX) 40 MG tablet Take 1 tablet (40 mg total) by mouth daily. 90 tablet 3  . glipiZIDE  (GLUCOTROL) 10 MG tablet TAKE 1 TABLET (10 MG TOTAL) BY MOUTH 2 (TWO) TIMES DAILY BEFORE A MEAL. 200 tablet 3  . Insulin Lispro Prot & Lispro (HUMALOG 75/25 MIX) (75-25) 100 UNIT/ML Kwikpen Inject 30 Units into the skin 1 day or 1 dose. INJECT 30 UNITS INTO THE SKIN DAILY WITH SUPPER. 2 pen 3  . Insulin Pen Needle 32G X 4 MM MISC Use once daily. Dx E11.9 100 each 3  . lisinopril (PRINIVIL,ZESTRIL) 40 MG tablet Take 1 tablet (40 mg total) by mouth daily. 100 tablet 3  . metFORMIN (GLUCOPHAGE) 1000 MG tablet Take 1 tablet (1,000 mg total) by mouth 2 (two) times daily with a meal. 200 tablet 0  . nitroGLYCERIN (NITROSTAT) 0.4 MG SL tablet PLACE 1 TABLET (0.4 MG TOTAL) UNDER THE TONGUE EVERY 5 (FIVE) MINUTES AS NEEDED. 25 tablet 0  . omeprazole (PRILOSEC) 20 MG capsule TAKE ONE CAPSULE BY MOUTH EVERY DAY 90 capsule 3  . ONE TOUCH ULTRA TEST test strip TEST EVERY DAY 100 each 3  . spironolactone (ALDACTONE) 25 MG tablet Take 1 tablet (25 mg total) by mouth daily. 90 tablet 3   No current facility-administered medications on file prior to visit.    BP 138/68 mmHg  Pulse 83  Temp(Src) 98.3 F (36.8 C) (Oral)  Ht 5\' 9"  (1.753 m)  Wt 261 lb 9.6 oz (118.661 kg)  BMI 38.61 kg/m2  SpO2 96%       Objective:   Physical Exam  Constitutional: He is oriented to person, place, and time. He appears well-developed and well-nourished. No distress.  Cardiovascular: Normal rate, regular rhythm and intact distal pulses.  Exam reveals no gallop and no friction rub.   No murmur heard. Pulmonary/Chest: Effort normal. No respiratory distress. He has wheezes in the right upper field, the right middle field, the right lower field, the left upper field, the left middle field and the left lower field. He has rhonchi in the right middle field, the right lower field, the left middle field and the left lower field. He has no rales. He exhibits no tenderness.  Neurological: He is alert and oriented to person, place, and  time.  Skin: He is not diaphoretic.  Nursing note and vitals reviewed.     Assessment & Plan:  1. SOB (shortness of breath) - ipratropium-albuterol (DUONEB) 0.5-2.5 (3) MG/3ML nebulizer solution 3 mL; Take 3 mLs by nebulization once. - DG Chest 2 View; Future - Negative for active lung disease. Stable  scarring or atelectasis at the lung bases right greater than left.  - After nebulizer treatment patient reports having an easier time breathing. He continued to sound wheezy with rhonchi in bases but was moving oxygen better.  - amoxicillin-clavulanate (AUGMENTIN) 875-125 MG tablet; Take 1 tablet by mouth 2 (two) times daily.  Dispense: 14 tablet; Refill: 0 - albuterol (PROVENTIL HFA;VENTOLIN HFA) 108 (90 Base) MCG/ACT inhaler; Inhale 2 puffs into the lungs every 6 (six) hours as needed for wheezing or shortness of breath.  Dispense: 1 Inhaler; Refill: 2

## 2016-02-02 ENCOUNTER — Encounter: Payer: Self-pay | Admitting: Cardiology

## 2016-02-09 ENCOUNTER — Other Ambulatory Visit: Payer: Self-pay | Admitting: Family Medicine

## 2016-02-22 ENCOUNTER — Ambulatory Visit (INDEPENDENT_AMBULATORY_CARE_PROVIDER_SITE_OTHER): Payer: Medicare Other | Admitting: Pulmonary Disease

## 2016-02-22 ENCOUNTER — Encounter: Payer: Self-pay | Admitting: Pulmonary Disease

## 2016-02-22 VITALS — BP 128/62 | HR 60 | Ht 69.0 in | Wt 262.8 lb

## 2016-02-22 DIAGNOSIS — Z9989 Dependence on other enabling machines and devices: Secondary | ICD-10-CM

## 2016-02-22 DIAGNOSIS — I2510 Atherosclerotic heart disease of native coronary artery without angina pectoris: Secondary | ICD-10-CM

## 2016-02-22 DIAGNOSIS — G4733 Obstructive sleep apnea (adult) (pediatric): Secondary | ICD-10-CM | POA: Diagnosis not present

## 2016-02-22 NOTE — Patient Instructions (Signed)
Follow up in 1 year.

## 2016-02-22 NOTE — Progress Notes (Signed)
Current Outpatient Prescriptions on File Prior to Visit  Medication Sig  . albuterol (PROVENTIL HFA;VENTOLIN HFA) 108 (90 Base) MCG/ACT inhaler Inhale 2 puffs into the lungs every 6 (six) hours as needed for wheezing or shortness of breath.  [name] amLODipine (NORVASC) 10 MG tablet Take 1 tablet (10 mg total) by mouth daily.  [name] aspirin 325 MG tablet Take 325 mg by mouth daily.   [name] atenolol (TENORMIN) 100 MG tablet Take 1 tablet (100 mg total) by mouth 2 (two) times daily.  [name] atorvastatin (LIPITOR) 40 MG tablet Take 1 tablet (40 mg total) by mouth daily at 6 PM.  . furosemide (LASIX) 20 MG tablet Take 1 tablet (20 mg total) by mouth daily.  . furosemide (LASIX) 40 MG tablet Take 1 tablet (40 mg total) by mouth daily.  . furosemide (LASIX) 40 MG tablet TAKE 1 TABLET BY MOUTH IN THE AM, 1/2 TABLET IN THE EVENING AS DIRECTED *TIME FOR AN OFFICE VISIT*  . glipiZIDE (GLUCOTROL) 10 MG tablet TAKE 1 TABLET (10 MG TOTAL) BY MOUTH 2 (TWO) TIMES DAILY BEFORE A MEAL.  [name] Insulin Lispro Prot & Lispro (HUMALOG 75/25 MIX) (75-25) 100 UNIT/ML Kwikpen Inject 30 Units into the skin 1 day or 1 dose. INJECT 30 UNITS INTO THE SKIN DAILY WITH SUPPER.  . Insulin Pen Needle 32G X 4 MM MISC Use once daily. Dx E11.9  . lisinopril (PRINIVIL,ZESTRIL) 40 MG tablet Take 1 tablet (40 mg total) by mouth daily.  . metFORMIN (GLUCOPHAGE) 1000 MG tablet Take 1 tablet (1,000 mg total) by mouth 2 (two) times daily with a meal.  . nitroGLYCERIN (NITROSTAT) 0.4 MG SL tablet PLACE 1 TABLET (0.4 MG TOTAL) UNDER THE TONGUE EVERY 5 (FIVE) MINUTES AS NEEDED.  [name] omeprazole (PRILOSEC) 20 MG capsule TAKE ONE CAPSULE BY MOUTH EVERY DAY  . ONE TOUCH ULTRA TEST test strip TEST EVERY DAY  . spironolactone (ALDACTONE) 25 MG tablet Take 1 tablet (25 mg total) by mouth daily.   No current facility-administered medications on file prior to visit.     Chief Complaint  Patient presents with  . Follow-up    Wears CPAP nightly x 6-7hrs. Denies any  issues with mask/pressure. Pt states that he sleeps well with CPAP. Pt does c/o some daytime somnolence. DME: [facility]      Tests Echo 02/02/13 >> mild LVH, EF 55 to 123456, grade 1 diastolic dysfx, mild/mod LA dilation, mild RA dilation PFT 07/20/15 >> FEV1 2.54 (89%), FEV1% 82, TLC 5.32 (80%), DLCO 81%, no BD HST 07/13/15 >> AHI 33.5, SaO2 low 67% Auto CPAP 01/21/16 to 02/19/16 >> used on 30 of 30 nights with average 6 hrs and 58 min.  Average AHI is 3 with median CPAP 7 cm H2O and 95 th percentile CPAP 9 cm H20.   Past medical hx DM, GERD, HLD, HTN, CAD s/p DES, Diastolic CHF, CVA, PAD  Past surgical hx, Allergies, Family hx, Social hx all reviewed.  Vital Signs BP 128/62 mmHg  Pulse 60  Ht 5\' 9"  (1.753 m)  Wt 262 lb 12.8 oz (119.205 kg)  BMI 38.79 kg/m2  SpO2 94%  History of Present Illness [name] is a 75 y.o. male with OSA.  He has been sleeping much better since starting CPAP.  He has nasal cup mask, and this fits well.  He feels more rested.  He is surprised how much better he is feeling.   Physical Exam  General - No distress ENT - No sinus tenderness,  no oral exudate, no LAN, MP 3, 2+ tonsils, elongated uvula Cardiac - s1s2 regular, no murmur Chest - No wheeze/rales/dullness Back - No focal tenderness Abd - Soft, non-tender Ext - No edema Neuro - Normal strength Skin - No rashes Psych - normal mood, and behavior   Assessment/Plan  Obstructive sleep apnea. He is compliant with therapy and reports benefit. Plan: - continue auto CPAP  Obesity. Plan: - discussed importance of weight loss   Patient Instructions  Follow up in 1 year     Chesley Mires, MD [place] Pager:  [phone]

## 2016-03-13 ENCOUNTER — Encounter: Payer: Self-pay | Admitting: Family

## 2016-03-20 ENCOUNTER — Ambulatory Visit (HOSPITAL_COMMUNITY)
Admission: RE | Admit: 2016-03-20 | Discharge: 2016-03-20 | Disposition: A | Discharge status: Home / Self Care | Payer: Medicare Other | Source: Ambulatory Visit | Attending: Family | Admitting: Family

## 2016-03-20 ENCOUNTER — Encounter: Payer: Self-pay | Admitting: Family

## 2016-03-20 ENCOUNTER — Ambulatory Visit (INDEPENDENT_AMBULATORY_CARE_PROVIDER_SITE_OTHER): Payer: Medicare Other | Admitting: Family

## 2016-03-20 VITALS — BP 133/57 | HR 66 | Temp 97.6°F | Resp 16 | Ht 68.5 in | Wt 260.0 lb

## 2016-03-20 DIAGNOSIS — I6522 Occlusion and stenosis of left carotid artery: Secondary | ICD-10-CM

## 2016-03-20 DIAGNOSIS — G4733 Obstructive sleep apnea (adult) (pediatric): Secondary | ICD-10-CM | POA: Diagnosis not present

## 2016-03-20 DIAGNOSIS — E785 Hyperlipidemia, unspecified: Secondary | ICD-10-CM | POA: Diagnosis not present

## 2016-03-20 DIAGNOSIS — Z9889 Other specified postprocedural states: Secondary | ICD-10-CM | POA: Diagnosis not present

## 2016-03-20 DIAGNOSIS — I872 Venous insufficiency (chronic) (peripheral): Secondary | ICD-10-CM | POA: Diagnosis not present

## 2016-03-20 DIAGNOSIS — I6523 Occlusion and stenosis of bilateral carotid arteries: Secondary | ICD-10-CM | POA: Insufficient documentation

## 2016-03-20 DIAGNOSIS — Z8673 Personal history of transient ischemic attack (TIA), and cerebral infarction without residual deficits: Secondary | ICD-10-CM

## 2016-03-20 DIAGNOSIS — I2510 Atherosclerotic heart disease of native coronary artery without angina pectoris: Secondary | ICD-10-CM | POA: Diagnosis not present

## 2016-03-20 DIAGNOSIS — K219 Gastro-esophageal reflux disease without esophagitis: Secondary | ICD-10-CM | POA: Insufficient documentation

## 2016-03-20 DIAGNOSIS — E119 Type 2 diabetes mellitus without complications: Secondary | ICD-10-CM | POA: Diagnosis not present

## 2016-03-20 DIAGNOSIS — I10 Essential (primary) hypertension: Secondary | ICD-10-CM | POA: Diagnosis not present

## 2016-03-20 DIAGNOSIS — Z48812 Encounter for surgical aftercare following surgery on the circulatory system: Secondary | ICD-10-CM

## 2016-03-20 DIAGNOSIS — I739 Peripheral vascular disease, unspecified: Secondary | ICD-10-CM | POA: Insufficient documentation

## 2016-03-20 NOTE — Progress Notes (Signed)
Chief Complaint: Extracranial Carotid Artery Stenosis   History of Present Illness  [name] is a 75 y.o. male patient of Dr. Scot Dock who is s/p left CEA on 08/23/2011. He returns today for follow up.  Patient has Positive history of stroke symptom, hemorrhagic stroke about 2000 as manifested by leaning to the left, resolved in a week, no further stroke or TIA events. The patient denies amaurosis fugax or monocular blindness. The patient denies facial drooping. Pt. denies hemiplegia. The patient denies receptive or expressive aphasia.   He denies claudication symptoms with walking, he had a cardiac stent placed, denies history of MI.[name]  Patient reports New Medical or Surgical History: was hospitalized at 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 He has dyspnea, states this is baseline for him.  Pt Diabetic: Yes, 8.6 A1C in December 2016 (review of records) Pt smoker: non-smoker, but exposed to father's secondhand smoke as a child   Pt meds include: Statin : Yes ASA: Yes Other anticoagulants/antiplatelets: no    Past Medical History  Diagnosis Date  . Diabetes mellitus   . GERD (gastroesophageal reflux disease)   . Hyperlipidemia   . Hypertension   . CVA (cerebral infarction) 2001, 2004  . CAD (coronary artery disease)     a. 06/2007 PCI LAD->Cypher DES;  b. 02/2010 Cath LM 30-40, LAD 40-51m, patent stent, D2 50, RI 70 ost, LCX 50, OM 30, RCA 30-70m, 30d, PDA 50, RPL 40-50p, nl EF.  . Carotid artery occlusion     a. 07/2011 L CEA;  b. 02/2012 Carotid U/S: RICA [id number], LICA patent.  . Peripheral vascular disease ([place]) 2001  . History of blood transfusion   . OSA (obstructive sleep apnea) 07/20/2015  . Acute diastolic congestive heart failure ([place]) 09/20/2015  . Chronic back pain     Social History Social History  Substance Use Topics  . Smoking status: Never Smoker   .  Smokeless tobacco: Never Used  . Alcohol Use: No    Family History Family History  Problem Relation Age of Onset  . Heart disease Mother     CHF Heart Disease before age 77  . Heart disease Father     Heart Disease before age 69  . Cancer Brother     Lung  . Cancer Brother     Lung  . COPD Brother     Surgical History Past Surgical History  Procedure Laterality Date  . Angioplasty  2008, 2010    stent placement 2008  . Kidney stone removal  1969  . Inguinal hernia repair  1962    right  . Pr vein bypass graft,aorto-fem-pop  2008  . Carotid endarterectomy Left Aug. 24, 2012    cea  . Heart stent  06-30-07    Allergies  Allergen Reactions  . Clopidogrel Bisulfate Itching and Rash    REACTION: rash, itching from Head to Toe   -  PLAVIX   . Adhesive [Tape] Rash    rash    Current Outpatient Prescriptions  Medication Sig Dispense Refill  . albuterol (PROVENTIL HFA;VENTOLIN HFA) 108 (90 Base) MCG/ACT inhaler Inhale 2 puffs into the lungs every 6 (six) hours as needed for wheezing or shortness of breath. 1 Inhaler 2  . amLODipine (NORVASC) 10 MG tablet Take 1 tablet (10 mg total) by mouth daily. 90 tablet 3  . aspirin 325 MG tablet Take 325 mg by mouth  daily.     . atenolol (TENORMIN) 100 MG tablet Take 1 tablet (100 mg total) by mouth 2 (two) times daily. 200 tablet 1  . atorvastatin (LIPITOR) 40 MG tablet Take 1 tablet (40 mg total) by mouth daily at 6 PM. 90 tablet 3  . furosemide (LASIX) 20 MG tablet Take 1 tablet (20 mg total) by mouth daily. 90 tablet 3  . furosemide (LASIX) 40 MG tablet Take 1 tablet (40 mg total) by mouth daily. 90 tablet 3  . furosemide (LASIX) 40 MG tablet TAKE 1 TABLET BY MOUTH IN THE AM, 1/2 TABLET IN THE EVENING AS DIRECTED *TIME FOR AN OFFICE VISIT* 60 tablet 0  . glipiZIDE (GLUCOTROL) 10 MG tablet TAKE 1 TABLET (10 MG TOTAL) BY MOUTH 2 (TWO) TIMES DAILY BEFORE A MEAL. 200 tablet 3  . lisinopril (PRINIVIL,ZESTRIL) 40 MG tablet Take 1 tablet (40  mg total) by mouth daily. 100 tablet 3  . metFORMIN (GLUCOPHAGE) 1000 MG tablet Take 1 tablet (1,000 mg total) by mouth 2 (two) times daily with a meal. 200 tablet 0  . nitroGLYCERIN (NITROSTAT) 0.4 MG SL tablet PLACE 1 TABLET (0.4 MG TOTAL) UNDER THE TONGUE EVERY 5 (FIVE) MINUTES AS NEEDED. 25 tablet 0  . omeprazole (PRILOSEC) 20 MG capsule TAKE ONE CAPSULE BY MOUTH EVERY DAY 90 capsule 3  . ONE TOUCH ULTRA TEST test strip TEST EVERY DAY 100 each 3  . spironolactone (ALDACTONE) 25 MG tablet Take 1 tablet (25 mg total) by mouth daily. 90 tablet 3  . Insulin Lispro Prot & Lispro (HUMALOG 75/25 MIX) (75-25) 100 UNIT/ML Kwikpen Inject 30 Units into the skin 1 day or 1 dose. INJECT 30 UNITS INTO THE SKIN DAILY WITH SUPPER. (Patient not taking: Reported on 03/20/2016) 2 pen 3  . Insulin Pen Needle 32G X 4 MM MISC Use once daily. Dx E11.9 (Patient not taking: Reported on 03/20/2016) 100 each 3   No current facility-administered medications for this visit.    Review of Systems : See HPI for pertinent positives and negatives.  Physical Examination  Filed Vitals:   03/20/16 1500 03/20/16 1502  BP: 127/57 133/57  Pulse: 66 66  Temp: 97.6 F (36.4 C)   Resp: 16   Height: 5' 8.5" (1.74 m)   Weight: 260 lb (117.935 kg)   SpO2: 95%    Body mass index is 38.95 kg/(m^2).  General: WDWN obese male in NAD GAIT: normal Eyes: PERRLA Pulmonary: Respirations are non-labored, CTAB Cardiac: regular rhythm, no detected murmur.  VASCULAR EXAM Carotid Bruits Left Right   Negative Negative   Radial pulses are 2+ palpable and equal.      LE Pulses LEFT RIGHT   POPLITEAL not palpable  not palpable    Gastrointestinal: soft, nontender, BS WNL, no r/g,no palpable masses.  Musculoskeletal: No muscle atrophy/wasting. M/S 5/5 throughout,  Extremities without ischemic changes. 2+ bilateral pretibial pitting edema, no venous stasis changes.  Neurologic: A&O X 3; Appropriate Affect, sensation is normal, Speech is normal CN 2-12 intact, Pain and light touch intact in extremities, Motor exam as listed above.                Non-Invasive Vascular Imaging CAROTID DUPLEX 03/20/2016   Right ICA: <40% stenosis. Left ICA: CEA site with no restenosis. No significant change compared to last exam on 03/17/15.    Assessment: [name] is a 75 y.o. male who is s/p left CEA on 08/23/2011. He had a hemorrhagic stroke in 2000 with  resolution of symptoms in a week, no strokes or TIA's since then.  Today's carotid Duplex suggests <40% right ICA stenosis. Left ICA: CEA site with no restenosis. No significant change compared to last exam on 03/17/15.   Venous insufficiency with 1-2+ pitting edema in ankles/feet/lower legs: information about proper fitting and use of graduated knee high compression hose given.  Printed information about ETI given.    Plan: Follow-up in 1 year with Carotid Duplex scan.   I discussed in depth with the patient the nature of atherosclerosis, and emphasized the importance of maximal medical management including strict control of blood pressure, blood glucose, and lipid levels, obtaining regular exercise, and continued cessation of smoking.  The patient is aware that without maximal medical management the underlying atherosclerotic disease process will progress, limiting the benefit of any interventions. The patient was given information about stroke prevention and what symptoms should prompt the patient to seek immediate medical care. Thank you for allowing [place] to participate in this patient's care.  Clemon Chambers, RN, MSN, FNP-C [facility] Office: [phone]  Clinic Physician: Scot Dock  03/20/2016 3:06 PM

## 2016-03-20 NOTE — Patient Instructions (Signed)
Stroke Prevention Some medical conditions and behaviors are associated with an increased chance of having a stroke. You may prevent a stroke by making healthy choices and managing medical conditions. HOW CAN I REDUCE MY RISK OF HAVING A STROKE?   Stay physically active. Get at least 30 minutes of activity on most or all days.  Do not smoke. It may also be helpful to avoid exposure to secondhand smoke.  Limit alcohol use. Moderate alcohol use is considered to be:  No more than 2 drinks per day for men.  No more than 1 drink per day for nonpregnant women.  Eat healthy foods. This involves:  Eating 5 or more servings of fruits and vegetables a day.  Making dietary changes that address high blood pressure (hypertension), high cholesterol, diabetes, or obesity.  Manage your cholesterol levels.  Making food choices that are high in fiber and low in saturated fat, trans fat, and cholesterol may control cholesterol levels.  Take any prescribed medicines to control cholesterol as directed by your health care provider.  Manage your diabetes.  Controlling your carbohydrate and sugar intake is recommended to manage diabetes.  Take any prescribed medicines to control diabetes as directed by your health care provider.  Control your hypertension.  Making food choices that are low in salt (sodium), saturated fat, trans fat, and cholesterol is recommended to manage hypertension.  Ask your health care provider if you need treatment to lower your blood pressure. Take any prescribed medicines to control hypertension as directed by your health care provider.  If you are 18-39 years of age, have your blood pressure checked every 3-5 years. If you are 40 years of age or older, have your blood pressure checked every year.  Maintain a healthy weight.  Reducing calorie intake and making food choices that are low in sodium, saturated fat, trans fat, and cholesterol are recommended to manage  weight.  Stop drug abuse.  Avoid taking birth control pills.  Talk to your health care provider about the risks of taking birth control pills if you are over 35 years old, smoke, get migraines, or have ever had a blood clot.  Get evaluated for sleep disorders (sleep apnea).  Talk to your health care provider about getting a sleep evaluation if you snore a lot or have excessive sleepiness.  Take medicines only as directed by your health care provider.  For some people, aspirin or blood thinners (anticoagulants) are helpful in reducing the risk of forming abnormal blood clots that can lead to stroke. If you have the irregular heart rhythm of atrial fibrillation, you should be on a blood thinner unless there is a good reason you cannot take them.  Understand all your medicine instructions.  Make sure that other conditions (such as anemia or atherosclerosis) are addressed. SEEK IMMEDIATE MEDICAL CARE IF:   You have sudden weakness or numbness of the face, arm, or leg, especially on one side of the body.  Your face or eyelid droops to one side.  You have sudden confusion.  You have trouble speaking (aphasia) or understanding.  You have sudden trouble seeing in one or both eyes.  You have sudden trouble walking.  You have dizziness.  You have a loss of balance or coordination.  You have a sudden, severe headache with no known cause.  You have new chest pain or an irregular heartbeat. Any of these symptoms may represent a serious problem that is an emergency. Do not wait to see if the symptoms will   go away. Get medical help at once. Call your local emergency services (911 in [place] Do not drive yourself to the hospital.   This information is not intended to replace advice given to you by your health care provider. Make sure you discuss any questions you have with your health care provider.   Document Released: 01/23/2005 Document Revised: 01/06/2015 Document Reviewed:  06/18/2013 Elsevier Interactive Patient Education 2016 Elsevier Inc.  

## 2016-03-21 ENCOUNTER — Other Ambulatory Visit: Payer: Self-pay | Admitting: *Deleted

## 2016-03-21 DIAGNOSIS — I6523 Occlusion and stenosis of bilateral carotid arteries: Secondary | ICD-10-CM

## 2016-05-23 IMAGING — CR DG CHEST 2V
2 series · 2 of 2 positions shown · non-contrast
Comparison: PA and lateral chest x-ray September 10, 2015

CLINICAL DATA: Follow-up of pneumonia, currently asymptomatic,
history of coronary artery disease and stent placement and diabetes.

EXAM:
CHEST  2 VIEW

[view not recorded (1 of 2)]
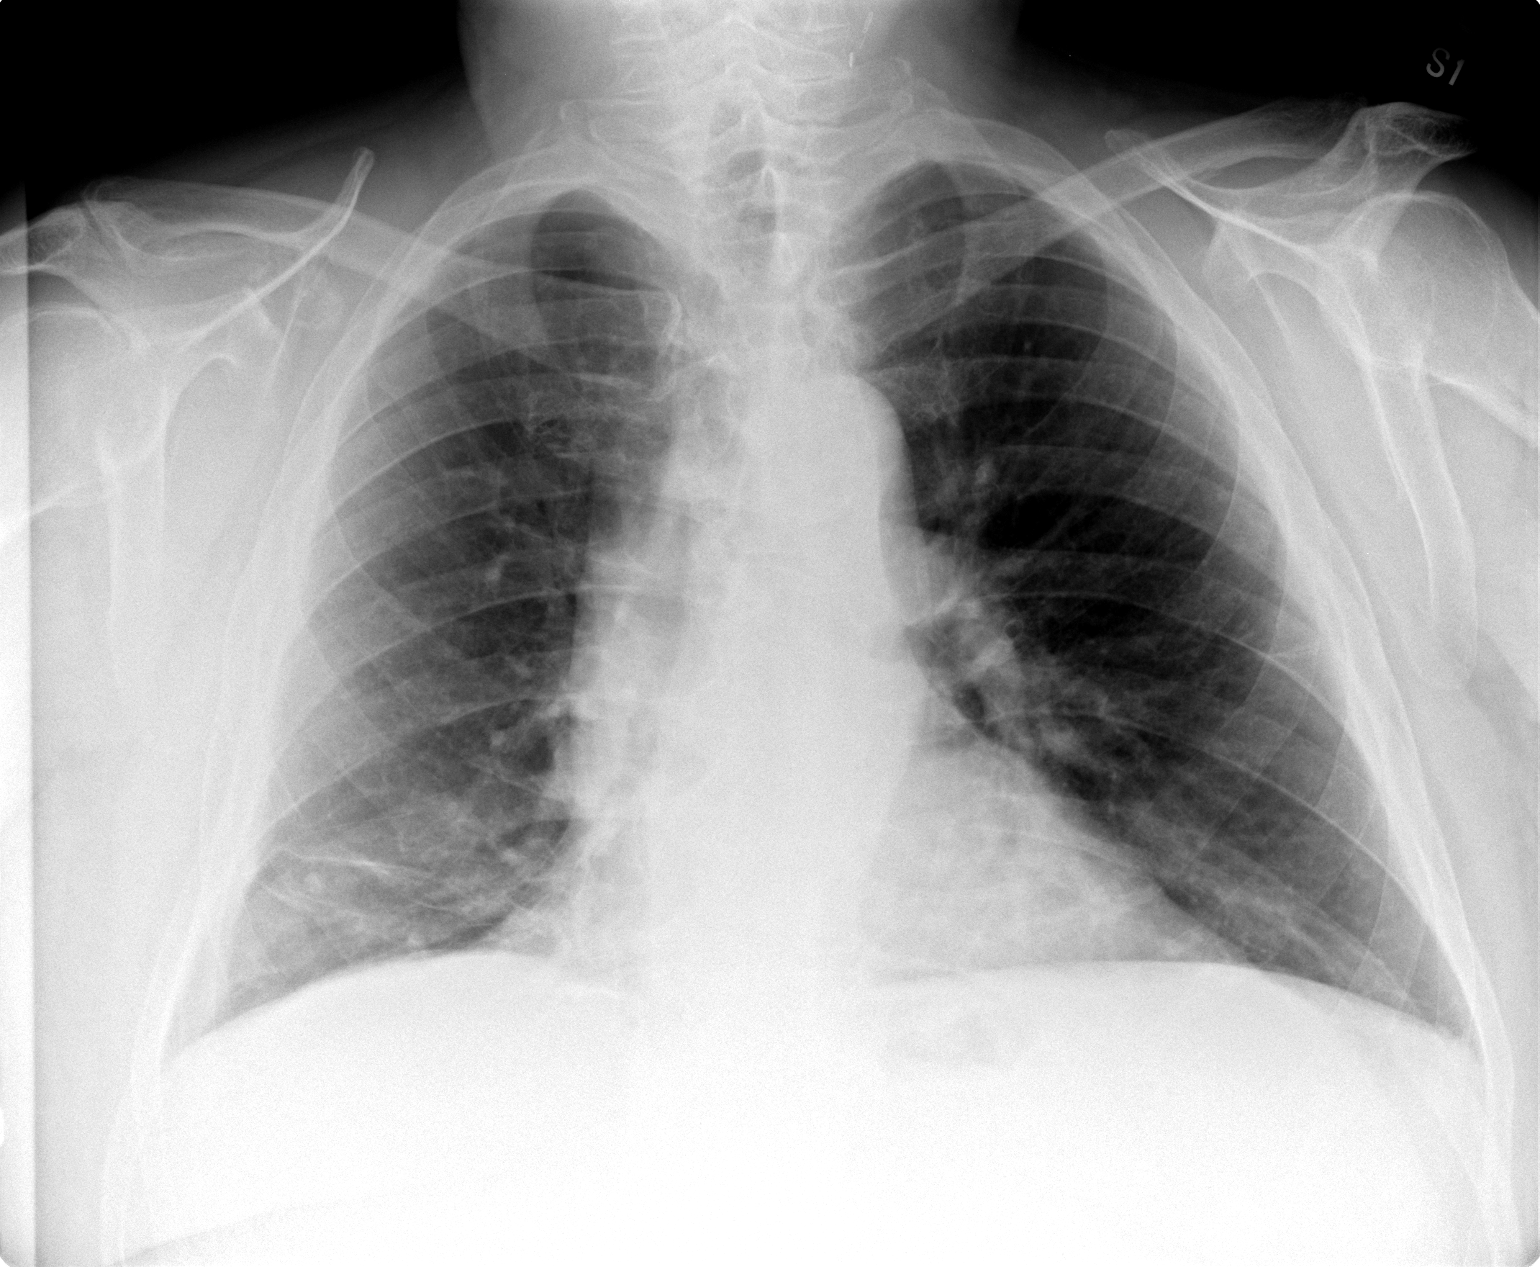

[view not recorded (2 of 2)]
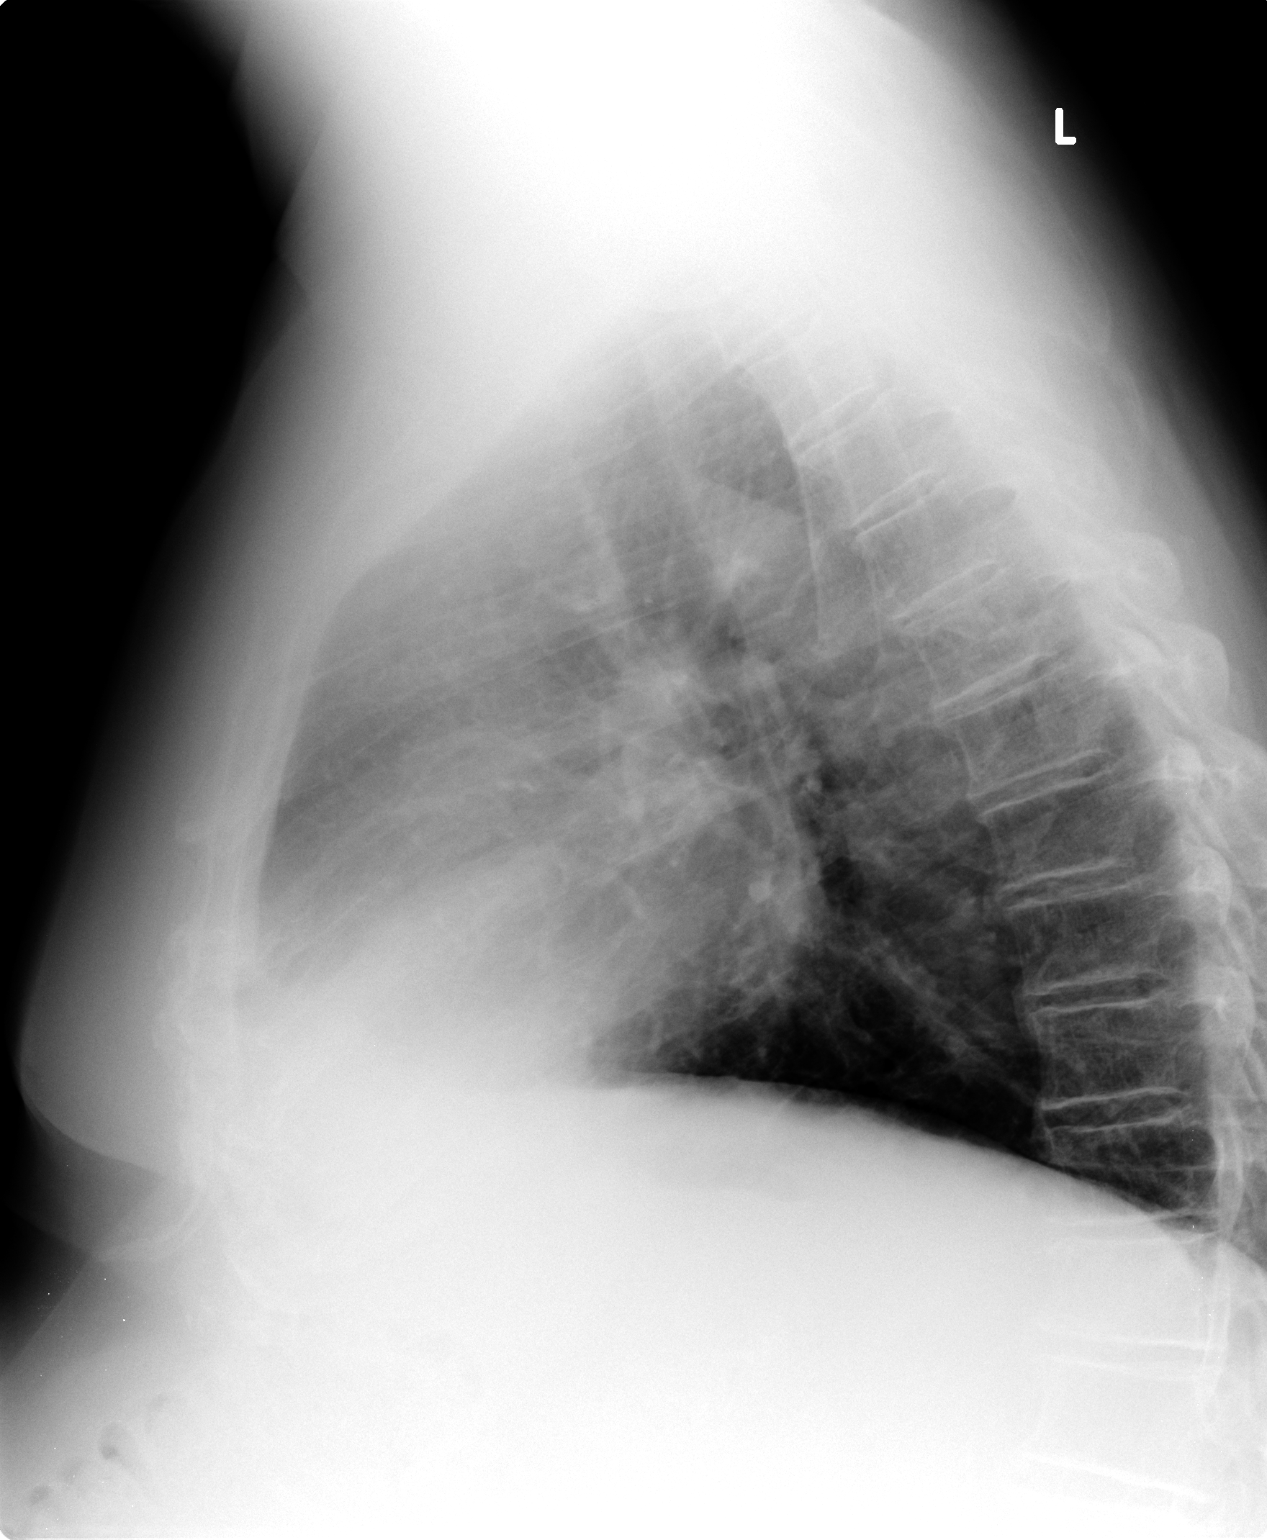

[2 of 2 positions shown; findings below may reference images not displayed]

FINDINGS: The lungs are adequately inflated. There is stable linear density at
the right lung base. The pulmonary interstitial markings are less
conspicuous bilaterally. The tiny left pleural effusion has
resolved. The heart and pulmonary vascularity are normal. There is
tortuosity of the descending thoracic aorta. The bony thorax
exhibits no acute abnormality.
IMPRESSION: Improvement in the pulmonary interstitium bilaterally with
resolution of the small left pleural effusion. There is stable
scarring at the right lung base.

## 2016-05-29 ENCOUNTER — Other Ambulatory Visit: Payer: Self-pay

## 2016-05-29 NOTE — Patient Outreach (Signed)
[facility] [facility]) Care Management  05/29/2016  [name] June 27, 1941 [id number]   Telephone call to patient from [facility] prevent referral.  Patient reports today is not a good time to talk. Advised patient that health coach would call another time.  He verbalized understanding.   Plan: RN Health Coach will attempt patient within 1-2 weeks.    Jone Baseman, RN, MSN [place] [phone]

## 2016-06-03 ENCOUNTER — Other Ambulatory Visit: Payer: Self-pay

## 2016-06-03 DIAGNOSIS — E118 Type 2 diabetes mellitus with unspecified complications: Secondary | ICD-10-CM

## 2016-06-03 NOTE — Patient Outreach (Signed)
[place] [facility]) Care Management  06/03/2016  Voss Kesselring Ritter [birth year] [id number]   Referral Date: 05-21-16  Referral Source: EMMI Prevent  Outreach Attempt: Second Attempt Successful  Social: Patient lives in the home with his wife.  Patient still drives and is independent with activities of daily living.  Patient reports that he is the caregiver for his wife.  He does have a son but he lives in [place].    Conditions: Patient admits to Diabetes, HTN, and sleep apnea. Patient reports his last A1C was 8.6 and his physician gave him samples of Basaglar but is not taking.  However, patient reports that he is going to start taking it as his morning blood sugars are in the 160-170 range.  Patient only checks blood sugar twice a week. Patient reports that his blood pressure is usually less than 140/90 with medications.  Patient wears CPAP machine faithfully at night.      Medications: Patient is non-compliant with insulin at this time but plans to start taking it.  Patient has no questions or concerns about medications.  Services: Patient in agreement to community nurse visiting to help patient with getting back on track with his insulin and getting blood sugars under better control.    Plan: RN Health Coach will do referral for community nurse for diabetes education and management.    Jone Baseman, RN, MSN [place] [phone]

## 2016-06-04 DIAGNOSIS — E13319 Other specified diabetes mellitus with unspecified diabetic retinopathy without macular edema: Secondary | ICD-10-CM | POA: Diagnosis not present

## 2016-06-04 DIAGNOSIS — H25813 Combined forms of age-related cataract, bilateral: Secondary | ICD-10-CM | POA: Diagnosis not present

## 2016-06-04 DIAGNOSIS — E119 Type 2 diabetes mellitus without complications: Secondary | ICD-10-CM | POA: Diagnosis not present

## 2016-06-04 DIAGNOSIS — H5213 Myopia, bilateral: Secondary | ICD-10-CM | POA: Diagnosis not present

## 2016-06-06 ENCOUNTER — Other Ambulatory Visit: Payer: Self-pay | Admitting: *Deleted

## 2016-06-06 NOTE — Patient Outreach (Addendum)
Referral received from health coach, D. Dia Sitter, requesting community assistance in management of diabetes (A1C - 8.6) and insulin.  According to chart, he also has a history of hypertension, coronary artery disease, congestive heart failure, and obstructive sleep apnea.  Call placed to schedule initial home visit, male answering phone state he is not available.  Attempted to leave callback number, but she state that she is not in a position to write the number.  Will follow up within the next 3 business days.  [name], [place], MSN [place] [phone]

## 2016-06-10 ENCOUNTER — Other Ambulatory Visit: Payer: Self-pay | Admitting: *Deleted

## 2016-06-10 NOTE — Patient Outreach (Signed)
Call placed to member earlier this morning in attempt to discuss [facility] services and schedule initial home visit, he state that he was on another call and requested this care manager to call back.  Call placed back to member this afternoon as requested, he again state that this is not a good time.  He state that he has been having computer issues for the past 2 weeks and would like to finish with the technician.  Will follow up within the the week.  Valente David, [place], MSN [place] [phone]

## 2016-06-12 ENCOUNTER — Other Ambulatory Visit: Payer: Self-pay | Admitting: *Deleted

## 2016-06-12 NOTE — Patient Outreach (Signed)
Call placed to member as he requested earlier this week.  He again states that this is not a good time to talk, stating he is just waking up and have to get his wife to an appointment.  He requests this care manager to call back tomorrow after 10am.  Will follow up tomorrow.  Valente David, [place], MSN [place] [phone]

## 2016-06-13 ENCOUNTER — Other Ambulatory Visit: Payer: Self-pay | Admitting: *Deleted

## 2016-06-13 NOTE — Patient Outreach (Signed)
Call placed back to member today per his request to schedule initial home visit.  He denies any urgent concerns, stating his biggest issues are diabetes management and complications (starting to get numbness in feet).  Discussed obtaining an appointment with a podiatrist, denies seeing one in the past.  Initial home visit scheduled for later this month.  Provided contact information, encouraged to contact with any questions.  [name], [place], MSN [place] [phone]

## 2016-06-17 ENCOUNTER — Other Ambulatory Visit: Payer: Self-pay | Admitting: Adult Health

## 2016-06-17 ENCOUNTER — Other Ambulatory Visit: Payer: Self-pay | Admitting: Family Medicine

## 2016-06-17 NOTE — Telephone Encounter (Signed)
Refill sent to pharmacy.   

## 2016-06-26 ENCOUNTER — Encounter: Payer: Self-pay | Admitting: *Deleted

## 2016-06-26 ENCOUNTER — Other Ambulatory Visit: Payer: Self-pay | Admitting: *Deleted

## 2016-06-26 VITALS — BP 128/64 | HR 62 | Resp 20 | Ht 69.5 in | Wt 260.0 lb

## 2016-06-26 DIAGNOSIS — I10 Essential (primary) hypertension: Secondary | ICD-10-CM

## 2016-06-26 DIAGNOSIS — E1169 Type 2 diabetes mellitus with other specified complication: Secondary | ICD-10-CM

## 2016-06-26 NOTE — Patient Outreach (Signed)
[facility]) Care Management   06/26/2016  [name] 13-Oct-1941 [id number]  [name] is an 75 y.o. male  Subjective:   Member states that he is doing "fairly well."  He denies any pain or discomfort at this time, stating he only has pain in his back when he is mobile.  He also reports some shortness of breath with activity.  He state he feel that his shortness of breath is due to conditioning, reports interest in becoming more active and losing weight.  He is aware of his [employer]ne[employer] benefit, state he will look into gym memberships.  Informed that [place] has a program at the [facility], encouraged to consider.  Objective:   Review of Systems  Constitutional: Negative.   HENT: Negative.   Eyes: Negative.   Respiratory: Positive for shortness of breath.        With activity  Cardiovascular: Negative.   Gastrointestinal: Negative.   Genitourinary: Negative.   Musculoskeletal: Negative.   Skin: Negative.   Neurological: Negative.   Endo/Heme/Allergies: Negative.   Psychiatric/Behavioral: Negative.     Physical Exam  Constitutional: He is oriented to person, place, and time. He appears well-developed and well-nourished.  Neck: Normal range of motion.  Cardiovascular: Normal rate, regular rhythm and normal heart sounds.   Respiratory: Effort normal and breath sounds normal.  GI: Soft. Bowel sounds are normal.  Musculoskeletal: Normal range of motion.  Neurological: He is alert and oriented to person, place, and time.  Skin: Skin is warm and dry.    BP 128/64 mmHg  Pulse 62  Resp 20  Ht 1.765 m (5' 9.5")  Wt 260 lb (117.935 kg)  BMI 37.86 kg/m2  SpO2 98%   Encounter Medications:   Outpatient Encounter Prescriptions as of 06/26/2016  Medication Sig  . albuterol (PROVENTIL HFA;VENTOLIN HFA) 108 (90 Base) MCG/ACT inhaler Inhale 2 puffs into the lungs every 6 (six) hours as needed for wheezing or shortness of breath.  [name] amLODipine  (NORVASC) 10 MG tablet Take 1 tablet (10 mg total) by mouth daily.  [name] aspirin 325 MG tablet Take 325 mg by mouth daily.   [name] atenolol (TENORMIN) 100 MG tablet Take 1 tablet (100 mg total) by mouth 2 (two) times daily.  [name] atorvastatin (LIPITOR) 40 MG tablet Take 1 tablet (40 mg total) by mouth daily at 6 PM.  . furosemide (LASIX) 20 MG tablet Take 1 tablet (20 mg total) by mouth daily.  . furosemide (LASIX) 40 MG tablet Take 1 tablet (40 mg total) by mouth daily.  . furosemide (LASIX) 40 MG tablet TAKE 1 TABLET BY MOUTH IN THE AM, 1/2 TABLET IN THE EVENING AS DIRECTED *TIME FOR AN OFFICE VISIT*  . glipiZIDE (GLUCOTROL) 10 MG tablet TAKE 1 TABLET (10 MG TOTAL) BY MOUTH 2 (TWO) TIMES DAILY BEFORE A MEAL.  [name] Insulin Glargine (BASAGLAR KWIKPEN) 100 UNIT/ML SOPN Inject 10 Units into the skin at bedtime.  . Insulin Pen Needle 32G X 4 MM MISC Use once daily. Dx E11.9  . lisinopril (PRINIVIL,ZESTRIL) 40 MG tablet Take 1 tablet (40 mg total) by mouth daily.  . metFORMIN (GLUCOPHAGE) 1000 MG tablet Take 1 tablet (1,000 mg total) by mouth 2 (two) times daily with a meal.  . metFORMIN (GLUCOPHAGE) 1000 MG tablet TAKE 1 TABLET BY MOUTH TWICE DAILY WITH MEALS  . nitroGLYCERIN (NITROSTAT) 0.4 MG SL tablet PLACE 1 TABLET (0.4 MG TOTAL) UNDER THE TONGUE EVERY 5 (FIVE) MINUTES AS NEEDED.  [name]  omeprazole (PRILOSEC) 20 MG capsule TAKE ONE CAPSULE BY MOUTH EVERY DAY  . ONE TOUCH ULTRA TEST test strip TEST EVERY DAY  . spironolactone (ALDACTONE) 25 MG tablet Take 1 tablet (25 mg total) by mouth daily.  . Insulin Lispro Prot & Lispro (HUMALOG 75/25 MIX) (75-25) 100 UNIT/ML Kwikpen Inject 30 Units into the skin 1 day or 1 dose. INJECT 30 UNITS INTO THE SKIN DAILY WITH SUPPER. (Patient not taking: Reported on 03/20/2016)   No facility-administered encounter medications on file as of 06/26/2016.    Functional Status:   In your present state of health, do you have any difficulty performing the following activities: 06/26/2016  09/09/2015  Hearing? N N  Vision? N N  Difficulty concentrating or making decisions? N N  Walking or climbing stairs? Y Y  Dressing or bathing? N N  Doing errands, shopping? N N  Preparing Food and eating ? N -  Using the Toilet? N -  In the past six months, have you accidently leaked urine? N -  Do you have problems with loss of bowel control? N -  Managing your Medications? N -  Managing your Finances? N -  Housekeeping or managing your Housekeeping? N -    Fall/Depression Screening:    PHQ 2/9 Scores 06/26/2016 06/03/2016 10/19/2015 10/19/2015 09/13/2014  PHQ - 2 Score 0 0 0 0 0   Fall Risk  06/26/2016 10/19/2015 10/19/2015 09/13/2014  Falls in the past year? No No No No    Assessment:    Arrived at [facility] home at scheduled time.  [facility] care management services again explained, consent signed.  Wife present during assessment.  Member discusses how he is the primary caregiver for his wife, and that he is still working part time.  He expresses how difficult it is to maintain his home, work, and care for her.  They state that their son has been trying to get them to move to [place] with him, but they are more interested in assisted living options in this area.  Advised that a Education officer, museum could assist with providing resources.  He agrees that he does need assistance with diabetes management.  He received a sample insulin from his PCP on the last visit, 01/31/16, but denies starting it.  Glucose meter reviewed, blood sugars range from 190s - 260s.  Re-educated on physician orders for insulin (according to PCP note, Basaglar, start at 10 units, increasing by 1 unit every other day until blood sugar reaches 130-140).  He verbalizes understanding, and will begin to document his readings in effort to help manage titrations.    All medications reviewed, he reports that he was not taking his Spironolactone, but will restart today.  He reports taking all other medications as prescribed using a pill  box.  He is provided with a new one as his old one does not have enough space for all meds and the writing is fading off.    Member denies any further concerns, contact information provided.  Advised to contact with questions.  Plan:   Will place referral to social worker for resources for assisted living options. Will follow up with care coordination call within two weeks. Routine home visit scheduled for next month.  [facility] CM Care Plan Problem One        Most Recent Value   Care Plan Problem One  Elevated A1C   Role Documenting the Problem One  Care Management [place] for Problem One  Active  [facility] Long Term Goal (31-90 days)  Member's A1C will be decreased within the next 3 months   [facility]N Long Term Goal Start Date  06/26/16   [facility] CM Short Term Goal #1 (0-30 days)  Member will report checking blood sugar as instructed (1-2 times a day) and document over the next 4 weeks   [facility] CM Short Term Goal #1 Start Date  06/26/16   Interventions for Short Term Goal #1  Verified that member had working glucose meter, advised on the importance of checking daily and docucmenting as it assists with management of diabetes   [facility] CM Short Term Goal #2 (0-30 days)  Member will report taking insulin as prescribed, increasing according to orders, over the next 4 weeks   [facility] CM Short Term Goal #2 Start Date  06/26/16   Interventions for Short Term Goal #2  Discussed physician instructions regarding titration of insulin (start at 10 units, increasing by 1 unit every other day until blood sugars are 130-140 range). Teachback method used for understanding.       Valente David, [place], MSN [place] [phone]

## 2016-06-28 ENCOUNTER — Encounter: Payer: Self-pay | Admitting: *Deleted

## 2016-07-08 ENCOUNTER — Other Ambulatory Visit: Payer: Self-pay | Admitting: *Deleted

## 2016-07-08 NOTE — Patient Outreach (Signed)
[facility]) Care Management  07/08/2016  [name] 1941-07-28 [id number]  CSW received a new referral on patient from Valente David, [facility] with [place] [facility], reporting that patient and wife would both benefit from social work services and resources to assist with long-term care placement into an assisted living facility.  Patient owns his own home and will need recommendations on how to put his home on the market, as well as liquidate all their assets.  Patient has a son that lives in [place], wanting his son to move closer to patient and his wife, as their health continues to fail and they require more medical assistance.   CSW made an initial attempt to try and contact patient today to perform phone assessment, as well as assess and assist with social needs and services, without success.  A HIPAA complaint message was left for patient on voicemail.  CSW is currently awaiting a return call.  In the meantime, CSW will mail patient a Forensic scientist Guide for their review. Nat Christen, BSW, MSW, LCSW  Licensed Education officer, environmental Health System  Mailing [place] [address], [place], [place] [address] Physical Address-[address], [place], [place] [address] Toll Free Main # [phone] Fax # [phone] Cell # [phone]  Fax # [phone]  [email or web address]

## 2016-07-19 ENCOUNTER — Other Ambulatory Visit: Payer: Self-pay | Admitting: *Deleted

## 2016-07-19 ENCOUNTER — Encounter: Payer: Self-pay | Admitting: *Deleted

## 2016-07-19 ENCOUNTER — Telehealth: Payer: Self-pay | Admitting: Adult Health

## 2016-07-19 NOTE — Patient Outreach (Signed)
[facility]) Care Management  07/19/2016  [place] 01-20-1941 [id number]   CSW was able to make initial contact with patient today to perform phone assessment, as well as assess and assist with social work needs and services.  CSW introduced self, explained role and types of services provided through [name] [facility] ([place] [facility]).  CSW further explained to patient that CSW works with patient's RNCM, also with [place] [facility], [name]. CSW then explained the reason for the call, indicating that [name] thought that patient would benefit from social work services and resources to assist with possible long-term care placement into an assisted living facility.  CSW obtained two HIPAA compliant identifiers from patient, which included patient's name and date of birth. Patient admits that he and his wife, [name] had considered assisted living placement in [facility], before viewing various facilities and learning of the cost.  Patient reports that his son, [name] lives in [place], [place], roughly two hours from patient's current place of residence, and that he and [name] plan to move closer to them within the next few months.  [name] considered renovating his existing basement for patient and [name], but have since decided that they will purchase a new home with an in-law suite.  Patient and [name] will be meeting with their son next weekend to finalize their moving arrangements.  Patient agreed to follow-up with CSW to report the outcome. CSW will perform a case closure on patient, as all goals of treatment have been met from social work standpoint and no additional social work needs have been identified at this time.  CSW will notify patient's RNCM with [place] [facility], [name] of CSW's plans to close patient's case.  CSW will  fax an update to [name], Dr.  Claudia [name] to ensure that they are aware of CSW's involvement with patient's plan of care.  CSW will submit a case closure request to [name], Care Management Assistant with [place] [facility], in the form of an [employer].  CSW will ensure that [name] is aware of [name], RNCM with [place] [facility], continued involvement with patient's care. [name], BSW, MSW, LCSW  Licensed Education officer, environmental Health System  Mailing [place] [address], [place], [place] [address] Physical Address-[address], [place], [place] [address] Toll Free Main # [phone] Fax # [phone] Cell # [phone]  Fax # [phone]  [name]

## 2016-07-19 NOTE — Telephone Encounter (Signed)
Pt last seen cory  in feb 2017 and was given a sample of  basaglar insulin. Pt would like another sample

## 2016-07-19 NOTE — Telephone Encounter (Signed)
We have samples. He can pick one up

## 2016-07-19 NOTE — Telephone Encounter (Signed)
Please advise 

## 2016-07-19 NOTE — Telephone Encounter (Signed)
Attempted to contact patient - phone line kept ringing.

## 2016-07-23 NOTE — Telephone Encounter (Signed)
Patient states he will be here today to pick up samples. Thanks.

## 2016-07-24 ENCOUNTER — Other Ambulatory Visit: Payer: Self-pay | Admitting: *Deleted

## 2016-07-24 NOTE — Patient Outreach (Signed)
[facility]) Care Management   07/24/2016  Geordie Nooney [place] Sep 26, 1941 [id number]  Kyrel Leighton Rabold is an 75 y.o. male  Subjective:   Member reports that he is "doing pretty good."  He denies any complications with his diabetes, stating that he has been using his insulin as instructed, and just received another sample pen from the PCP office on Monday.  He state he is up to 15 units daily, and report his blood sugar today being 155.  He state that he will continue to titrate as instructed, and monitor daily.    Objective:   Review of Systems  Constitutional: Negative.   HENT: Negative.   Eyes: Negative.   Respiratory: Negative.   Cardiovascular: Negative.   Gastrointestinal: Negative.   Genitourinary: Negative.   Musculoskeletal: Positive for back pain.  Skin: Negative.   Neurological: Negative.   Endo/Heme/Allergies: Negative.   Psychiatric/Behavioral: Negative.     Physical Exam  Constitutional: He is oriented to person, place, and time. He appears well-developed and well-nourished.  Neck: Normal range of motion.  Cardiovascular: Normal rate, regular rhythm and normal heart sounds.   Respiratory: Effort normal and breath sounds normal.  GI: Soft. Bowel sounds are normal.  Musculoskeletal: Normal range of motion.  Neurological: He is alert and oriented to person, place, and time.  Skin: Skin is warm.    BP 136/78 (BP Location: Left Arm, Patient Position: Sitting, Cuff Size: Normal)   Pulse 65   Resp 18   SpO2 95%    Encounter Medications:   Outpatient Encounter Prescriptions as of 07/24/2016  Medication Sig Note  . albuterol (PROVENTIL HFA;VENTOLIN HFA) 108 (90 Base) MCG/ACT inhaler Inhale 2 puffs into the lungs every 6 (six) hours as needed for wheezing or shortness of breath.   [name] amLODipine (NORVASC) 10 MG tablet Take 1 tablet (10 mg total) by mouth daily.   [name] aspirin 325 MG tablet Take 325 mg by mouth daily.    [name] atenolol (TENORMIN)  100 MG tablet Take 1 tablet (100 mg total) by mouth 2 (two) times daily.   [name] atorvastatin (LIPITOR) 40 MG tablet Take 1 tablet (40 mg total) by mouth daily at 6 PM.   . furosemide (LASIX) 20 MG tablet Take 1 tablet (20 mg total) by mouth daily.   . furosemide (LASIX) 40 MG tablet Take 1 tablet (40 mg total) by mouth daily.   . furosemide (LASIX) 40 MG tablet TAKE 1 TABLET BY MOUTH IN THE AM, 1/2 TABLET IN THE EVENING AS DIRECTED *TIME FOR AN OFFICE VISIT*   . glipiZIDE (GLUCOTROL) 10 MG tablet TAKE 1 TABLET (10 MG TOTAL) BY MOUTH 2 (TWO) TIMES DAILY BEFORE A MEAL.   [name] Insulin Glargine (BASAGLAR KWIKPEN) 100 UNIT/ML SOPN Inject 10 Units into the skin at bedtime. 07/24/2016: Taking 15 units daily  . Insulin Pen Needle 32G X 4 MM MISC Use once daily. Dx E11.9   . lisinopril (PRINIVIL,ZESTRIL) 40 MG tablet Take 1 tablet (40 mg total) by mouth daily.   . metFORMIN (GLUCOPHAGE) 1000 MG tablet Take 1 tablet (1,000 mg total) by mouth 2 (two) times daily with a meal.   . metFORMIN (GLUCOPHAGE) 1000 MG tablet TAKE 1 TABLET BY MOUTH TWICE DAILY WITH MEALS   . nitroGLYCERIN (NITROSTAT) 0.4 MG SL tablet PLACE 1 TABLET (0.4 MG TOTAL) UNDER THE TONGUE EVERY 5 (FIVE) MINUTES AS NEEDED.   [name] omeprazole (PRILOSEC) 20 MG capsule TAKE ONE CAPSULE BY MOUTH EVERY DAY   . ONE  TOUCH ULTRA TEST test strip TEST EVERY DAY   . spironolactone (ALDACTONE) 25 MG tablet Take 1 tablet (25 mg total) by mouth daily.   . Insulin Lispro Prot & Lispro (HUMALOG 75/25 MIX) (75-25) 100 UNIT/ML Kwikpen Inject 30 Units into the skin 1 day or 1 dose. INJECT 30 UNITS INTO THE SKIN DAILY WITH SUPPER. (Patient not taking: Reported on 07/24/2016)    No facility-administered encounter medications on file as of 07/24/2016.     Functional Status:   In your present state of health, do you have any difficulty performing the following activities: 07/19/2016 06/26/2016  Hearing? N N  Vision? N N  Difficulty concentrating or making decisions? N N   Walking or climbing stairs? N Y  Dressing or bathing? N N  Doing errands, shopping? N N  Preparing Food and eating ? N N  Using the Toilet? N N  In the past six months, have you accidently leaked urine? N N  Do you have problems with loss of bowel control? N N  Managing your Medications? N N  Managing your Finances? N N  Housekeeping or managing your Housekeeping? N N  Some recent data might be hidden    Fall/Depression Screening:    PHQ 2/9 Scores 07/19/2016 06/26/2016 06/03/2016 10/19/2015 10/19/2015 09/13/2014  PHQ - 2 Score 0 0 0 0 0 0  Exception Documentation (No Data) - - - - -    Assessment:    Met with member at scheduled time, wife present during visit.  Discussed possible long term treatment of diabetes (current insulin vs alternate medication).  He state that as of right now, he is to continue current insulin.  Noted that he has not had an A1C done since December.  Advised to continue insulin regime for another 1-2 weeks and notify PCP of progression toward goal.  Also advised to request order for A1C to assess treatment progress.  He verbalizes understanding.  Member states that he still has not scheduled an appointment with the podiatrist, but state that he will as soon as possible.  He does confirm that he has had a vision test and state no concerns.    He denies any other concerns at this time.  Discussed the possibility of involvement with health coach to provide ongoing education for diabetes, he denies the need at this time, but will consider.  He report that he has been in contact with J. Saporito, LCSW, regarding possible placement vs moving with his son.  He state that he is planning to discuss further with son and provide follow up call to Ms. Saporito.    Member advised to contact this care manager with questions, provided again with contact information.  Plan:   Will follow up next month via phone call regarding blood sugar trend.  Will discuss further involvement  (health coach vs community vs case closure) with [facility] at that time.    [facility] CM Care Plan Problem One   Flowsheet Row Most Recent Value  Care Plan Problem One  Elevated A1C  Role Documenting the Problem One  Care Management Coordinator  Care Plan for Problem One  Active  [facility] Long Term Goal (31-90 days)  Member's A1C will be decreased within the next 3 months  [facility] Long Term Goal Start Date  06/26/16  [facility] CM Short Term Goal #1 (0-30 days)  Member will report checking blood sugar as instructed (1-2 times a day) and document over the next 4 weeks  [facility] CM Short  Term Goal #1 Start Date  06/26/16  [facility] CM Short Term Goal #1 Met Date  07/24/16  Interventions for Short Term Goal #1  Verified that member had working glucose meter, advised on the importance of checking daily and docucmenting as it assists with management of diabetes  [facility] CM Short Term Goal #2 (0-30 days)  Member will report taking insulin as prescribed, increasing according to orders, over the next 4 weeks  [facility] CM Short Term Goal #2 Start Date  06/26/16  [facility] CM Short Term Goal #2 Met Date  07/24/16  Interventions for Short Term Goal #2  Discussed physician instructions regarding titration of insulin (start at 10 units, increasing by 1 unit every other day until blood sugars are 130-140 range). Teachback method used for understanding.       Valente David, [place], MSN [place] [phone]

## 2016-08-14 ENCOUNTER — Other Ambulatory Visit: Payer: Self-pay | Admitting: *Deleted

## 2016-08-14 ENCOUNTER — Other Ambulatory Visit: Payer: Self-pay | Admitting: Adult Health

## 2016-08-14 DIAGNOSIS — I2510 Atherosclerotic heart disease of native coronary artery without angina pectoris: Secondary | ICD-10-CM

## 2016-08-14 DIAGNOSIS — E1169 Type 2 diabetes mellitus with other specified complication: Secondary | ICD-10-CM

## 2016-08-14 NOTE — Patient Outreach (Signed)
[facility]) Care Management  08/14/2016  Benyamin Nakamura [place] 01/15/1941 [id number]   Call placed to follow up on member's current status.  He reports that he is doing well, but state that his blood sugars remain in the 150s.  He state that he has attempted to contact his PCP office to schedule a follow up appointment but was unsuccessful after being on hold for an extended period of time.  He state that he will call again this today to schedule.    Member state that he feels he is managing his insulin "pretty good" and is agreeable to be involved with health coach at this time.  All needs for community case management/ has been addressed.  Will place referral to health coach.  He is made aware that should the need for a home visit arise, he is free to notify the health coach for referral back to community.   Valente David, [place], MSN [place] [phone]

## 2016-08-16 ENCOUNTER — Encounter: Payer: Self-pay | Admitting: Internal Medicine

## 2016-08-19 ENCOUNTER — Other Ambulatory Visit: Payer: Self-pay

## 2016-08-19 ENCOUNTER — Telehealth: Payer: Self-pay | Admitting: Cardiology

## 2016-08-19 NOTE — Patient Outreach (Signed)
[facility]) Care Management  08/19/2016  [name] [birth year] [id number]   Telephone call to patient for monthly call. Patient reports he is doing ok. He reports having some chest pain on Saturday that was resolved with his nitroglycerin. Patient states that he is notifying his cardiologist as he was instructed to notify if he ever had to take his nitroglycerin.  Patient states this was his first ever episode.  Patient receptive to health coach role.    Plan: RN Health Coach will contact patient in the month of September and patient agrees to next outreach.  Jone Baseman, RN, MSN [place] [phone]

## 2016-08-19 NOTE — Telephone Encounter (Signed)
Spoke with pt, he had mild chest pain on Tuesday and Thursday last week that only lasted a few seconds and then went away. Saturday he had deep chest discomfort that was persistent and went up into his neck. He reports it felt like indigestion. This occurred while watching TV. He took 3 NTG and after the 3rd the discomfort went away and he has felt great since then. He reports SOB when walking to the mailbox and back but that has been his normal for about one year per his report. He has never had this type of pain before, he had SOB prior to his previous stents. He has had no pain since Saturday. Follow up scheduled with hao meng Wednesday this week. Patient voiced understanding to call with problems prior to the appointment.

## 2016-08-19 NOTE — Telephone Encounter (Signed)
New message      FYI     Pt states that he had to take 3 nitroglycerin last week for chest pain. Not having any chest pain currently. Please call

## 2016-08-21 ENCOUNTER — Other Ambulatory Visit: Payer: Self-pay | Admitting: Physician Assistant

## 2016-08-21 ENCOUNTER — Encounter: Payer: Self-pay | Admitting: Cardiovascular Disease

## 2016-08-21 ENCOUNTER — Ambulatory Visit (INDEPENDENT_AMBULATORY_CARE_PROVIDER_SITE_OTHER): Payer: Medicare Other | Admitting: Physician Assistant

## 2016-08-21 ENCOUNTER — Encounter: Payer: Self-pay | Admitting: Physician Assistant

## 2016-08-21 VITALS — BP 140/72 | HR 59 | Ht 69.5 in | Wt 265.4 lb

## 2016-08-21 DIAGNOSIS — I5032 Chronic diastolic (congestive) heart failure: Secondary | ICD-10-CM | POA: Diagnosis not present

## 2016-08-21 DIAGNOSIS — I63239 Cerebral infarction due to unspecified occlusion or stenosis of unspecified carotid arteries: Secondary | ICD-10-CM | POA: Diagnosis not present

## 2016-08-21 DIAGNOSIS — I6523 Occlusion and stenosis of bilateral carotid arteries: Secondary | ICD-10-CM

## 2016-08-21 DIAGNOSIS — E785 Hyperlipidemia, unspecified: Secondary | ICD-10-CM

## 2016-08-21 DIAGNOSIS — G4733 Obstructive sleep apnea (adult) (pediatric): Secondary | ICD-10-CM | POA: Diagnosis not present

## 2016-08-21 DIAGNOSIS — Z9989 Dependence on other enabling machines and devices: Secondary | ICD-10-CM

## 2016-08-21 DIAGNOSIS — I10 Essential (primary) hypertension: Secondary | ICD-10-CM

## 2016-08-21 DIAGNOSIS — I25119 Atherosclerotic heart disease of native coronary artery with unspecified angina pectoris: Secondary | ICD-10-CM | POA: Diagnosis not present

## 2016-08-21 LAB — CBC WITH DIFFERENTIAL/PLATELET
Basophils Absolute: 0 cells/uL (ref 0–200)
Basophils Relative: 0 %
Eosinophils Absolute: 273 cells/uL (ref 15–500)
Eosinophils Relative: 3 %
HCT: 40.3 % (ref 38.5–50.0)
Hemoglobin: 13 g/dL — ABNORMAL LOW (ref 13.2–17.1)
Lymphocytes Relative: 27 %
Lymphs Abs: 2457 cells/uL (ref 850–3900)
MCH: 28.8 pg (ref 27.0–33.0)
MCHC: 32.3 g/dL (ref 32.0–36.0)
MCV: 89.4 fL (ref 80.0–100.0)
MPV: 8.9 fL (ref 7.5–12.5)
Monocytes Absolute: 910 cells/uL (ref 200–950)
Monocytes Relative: 10 %
Neutro Abs: 5460 cells/uL (ref 1500–7800)
Neutrophils Relative %: 60 %
Platelets: 252 10*3/uL (ref 140–400)
RBC: 4.51 MIL/uL (ref 4.20–5.80)
RDW: 16.1 % — ABNORMAL HIGH (ref 11.0–15.0)
WBC: 9.1 10*3/uL (ref 3.8–10.8)

## 2016-08-21 LAB — BASIC METABOLIC PANEL
BUN: 15 mg/dL (ref 7–25)
CO2: 26 mmol/L (ref 20–31)
Calcium: 9.8 mg/dL (ref 8.6–10.3)
Chloride: 102 mmol/L (ref 98–110)
Creat: 0.87 mg/dL (ref 0.70–1.18)
Glucose, Bld: 188 mg/dL — ABNORMAL HIGH (ref 65–99)
Potassium: 5.1 mmol/L (ref 3.5–5.3)
Sodium: 138 mmol/L (ref 135–146)

## 2016-08-21 NOTE — Patient Instructions (Signed)
Medication Instructions:  Your physician recommends that you continue on your current medications as directed. Please refer to the Current Medication list given to you today.   Labwork: Bmet, Cbc, Pt/Inr today  Testing/Procedures: Your physician has requested that you have a cardiac catheterization. Cardiac catheterization is used to diagnose and/or treat various heart conditions. Doctors may recommend this procedure for a number of different reasons. The most common reason is to evaluate chest pain. Chest pain can be a symptom of coronary artery disease (CAD), and cardiac catheterization can show whether plaque is narrowing or blocking your heart's arteries. This procedure is also used to evaluate the valves, as well as measure the blood flow and oxygen levels in different parts of your heart. For further information please visit [email or web address]. Please follow instruction sheet, as given.    Follow-Up: Your physician recommends that you schedule a follow-up appointment pending cath findings.   Any Other Special Instructions Will Be Listed Below (If Applicable). Please seek emergency care for chest pain lasting >30 minute not relieved with Nitroglycerin     If you need a refill on your cardiac medications before your next appointment, please call your pharmacy.

## 2016-08-21 NOTE — Progress Notes (Signed)
Cardiology Office Note    Date:  08/21/2016   ID:  BARTT HA, DOB 11-Sep-1941, MRN [id number]  PCP:  Dorothyann Peng, NP  Cardiologist:  Dr. Stanford Breed  Chief Complaint  Patient presents with  . Follow-up    seen for Dr. Stanford Breed    History of Present Illness:  [name] is a 75 y.o. male with PMH of CAD s/p Cypher DES to LAD [id number], Chronic diastolic heart failure, OSA on CPAP followed by Dr. Halford Chessman Pulmonology, HTN, HLD, CVA and PAD s/p L CEA in 8/12. He had an ETT Myoview on 02/04/2015, he did have chest pain during the Myoview at peak exercise with EKG changes post exercise including downsloping ST segment in the inferolateral leads, however image was normal with no evidence of ischemia, EF 59%. He also has chronic edema and history of CVA. Abdominal ultrasound obtained in 5/16 showed no aneurysm. Echocardiogram obtained in September 2016 showed normal LV function, mild LAE. Patient was last seen in cardiology clinic by Dr. Stanford Breed on 01/04/2016, at which time he still have some dyspnea on exertion however denies any orthopnea, PND or palpitation. It was felt his dyspnea is likely multifactorial including obesity, deconditioning, obstructive sleep apnea and a chronic diastolic congestive heart failure. He did undergo a repeat outpatient Myoview on 01/16/2016 which showed EF 54%, no significant ST changes, small mild, fixed apical and inferior basal defect consistent with thinning, and no ischemia was noted, overall low risk study. He presents today for 11-month cardiology follow-up.  He was seen by Dr. Halford Chessman in February 2017, at which time he was compliant with CPAP machine, it was recommended for him to continue on current sleep apnea. He was seen by vascular surgery in March 26 17, he was doing well from carotid perspective, follow-up in one year with carotid duplex scan was recommended. However, since last week, he has had 3 episodes of chest pain, all occurred at rest. He says  he never had chest pain with previous angina, as always been worsening dyspnea on exertion. He does have chronic dyspnea on exertion at baseline which he says is only slightly worse in the last year. The worst episode chest pain was last Saturday, and it lasted roughly 25 minutes and resolved only after 3 nitroglycerin. He did not seek medical attention at the time. He does have trace edema on physical exam, however since he did not take his Lasix today. His fluid status is usually very well-managed on the current dose of Lasix. His lung is clear. He is blood pressure today is mildly elevated however he says his blood pressure is usually well in the 120s when he visited his PCP. I would not increase blood pressure medication at this time. As far as his chest pain, although he did not have chest pain with previous angina, his EKG today does show some more prominent T-wave flattening in the lateral leads. I have discussed with Dr. Sallyanne Kuster, I do not think it is beneficial for him to have a repeat Myoview since he just had one in January. After discussing various risk and benefit, he eventually agreed to undergo diagnostic cardiac catheterization.    Past Medical History:  Diagnosis Date  . Acute diastolic congestive heart failure ([place]) 09/20/2015  . CAD (coronary artery disease)    a. 06/2007 PCI LAD->Cypher DES;  b. 02/2010 Cath LM 30-40, LAD 40-32m, patent stent, D2 50, RI 70 ost, LCX 50, OM 30, RCA 30-64m, 30d, PDA 50, RPL 40-50p, nl  EF.  . Carotid artery occlusion    a. 07/2011 L CEA;  b. 02/2012 Carotid U/S: RICA [id number], LICA patent.  . Chronic back pain   . CVA (cerebral infarction) 2001, 2004  . Diabetes mellitus   . GERD (gastroesophageal reflux disease)   . History of blood transfusion   . Hyperlipidemia   . Hypertension   . OSA (obstructive sleep apnea) 07/20/2015  . Peripheral vascular disease ([place]) 2001    Past Surgical History:  Procedure Laterality Date  . ANGIOPLASTY  2008, 2010    stent placement 2008  . CAROTID ENDARTERECTOMY Left Aug. 24, 2012   cea  . Heart stent  06-30-07  . [place]   right  . kidney stone removal  1969  . PR VEIN BYPASS GRAFT,AORTO-FEM-POP  2008    Current Medications: Outpatient Medications Prior to Visit  Medication Sig Dispense Refill  . albuterol (PROVENTIL HFA;VENTOLIN HFA) 108 (90 Base) MCG/ACT inhaler Inhale 2 puffs into the lungs every 6 (six) hours as needed for wheezing or shortness of breath. 1 Inhaler 2  . amLODipine (NORVASC) 10 MG tablet Take 1 tablet (10 mg total) by mouth daily. 90 tablet 3  . aspirin 325 MG tablet Take 325 mg by mouth daily.     [name] atenolol (TENORMIN) 100 MG tablet Take 1 tablet (100 mg total) by mouth 2 (two) times daily. 200 tablet 1  . atorvastatin (LIPITOR) 40 MG tablet Take 1 tablet (40 mg total) by mouth daily at 6 PM. 90 tablet 3  . furosemide (LASIX) 20 MG tablet Take 1 tablet (20 mg total) by mouth daily. 90 tablet 3  . furosemide (LASIX) 40 MG tablet Take 1 tablet (40 mg total) by mouth daily. 90 tablet 3  . glipiZIDE (GLUCOTROL) 10 MG tablet TAKE 1 TABLET (10 MG TOTAL) BY MOUTH 2 (TWO) TIMES DAILY BEFORE A MEAL. 200 tablet 3  . Insulin Glargine (BASAGLAR KWIKPEN) 100 UNIT/ML SOPN Inject 15 Units into the skin at bedtime.     [name] lisinopril (PRINIVIL,ZESTRIL) 40 MG tablet Take 1 tablet (40 mg total) by mouth daily. 100 tablet 3  . metFORMIN (GLUCOPHAGE) 1000 MG tablet TAKE 1 TABLET BY MOUTH TWICE DAILY WITH MEALS 200 tablet 0  . nitroGLYCERIN (NITROSTAT) 0.4 MG SL tablet PLACE 1 TABLET (0.4 MG TOTAL) UNDER THE TONGUE EVERY 5 (FIVE) MINUTES AS NEEDED. 25 tablet 0  . omeprazole (PRILOSEC) 20 MG capsule TAKE ONE CAPSULE BY MOUTH EVERY DAY 90 capsule 3  . spironolactone (ALDACTONE) 25 MG tablet Take 1 tablet (25 mg total) by mouth daily. 90 tablet 3  . Insulin Pen Needle 32G X 4 MM MISC Use once daily. Dx E11.9 100 each 3  . ONE TOUCH ULTRA TEST test strip TEST EVERY DAY 100 each 3  .  furosemide (LASIX) 40 MG tablet TAKE 1 TABLET BY MOUTH IN THE AM, 1/2 TABLET IN THE EVENING AS DIRECTED *TIME FOR AN OFFICE VISIT* 60 tablet 0  . Insulin Lispro Prot & Lispro (HUMALOG 75/25 MIX) (75-25) 100 UNIT/ML Kwikpen Inject 30 Units into the skin 1 day or 1 dose. INJECT 30 UNITS INTO THE SKIN DAILY WITH SUPPER. (Patient not taking: Reported on 07/24/2016) 2 pen 3  . metFORMIN (GLUCOPHAGE) 1000 MG tablet Take 1 tablet (1,000 mg total) by mouth 2 (two) times daily with a meal. 200 tablet 0   No facility-administered medications prior to visit.      Allergies:   Clopidogrel bisulfate and Adhesive [tape]  Social History   Social History  . Marital status: Married    Spouse name: N/A  . Number of children: N/A  . Years of education: N/A   Occupational History  . Medical illustrator    Social History Main Topics  . Smoking status: Never Smoker  . Smokeless tobacco: Never Used  . Alcohol use No  . Drug use: No  . Sexual activity: Not Asked   Other Topics Concern  . None   Social History Narrative   Works part time as an Medical illustrator.   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    One son who lives in [place] - son is adopted.    She takes care of his wife, she is oxygen continuously.      Family History:  The patient's family history includes COPD in his brother; Cancer in his brother and brother; Heart disease in his father and mother.   ROS:   Please see the history of present illness.    ROS All other systems reviewed and are negative.   PHYSICAL EXAM:   VS:  BP 140/72   Pulse (!) 59   Ht 5' 9.5" (1.765 m)   Wt 265 lb 6.4 oz (120.4 kg)   BMI 38.63 kg/m    GEN: Well nourished, well developed, in no acute distress  HEENT: normal  Neck: no JVD, carotid bruits, or masses Cardiac: RRR; no murmurs, rubs, or gallops,no edema  Respiratory:  clear to auscultation bilaterally, normal work of breathing GI: soft, nontender, nondistended, + BS MS: no deformity or atrophy  Skin: warm and dry,  no rash Neuro:  Alert and Oriented x 3, Strength and sensation are intact Psych: euthymic mood, full affect  Wt Readings from Last 3 Encounters:  08/21/16 265 lb 6.4 oz (120.4 kg)  06/26/16 260 lb (117.9 kg)  03/20/16 260 lb (117.9 kg)      Studies/Labs Reviewed:   EKG:  EKG is ordered today.  The ekg ordered today demonstrates sinus brady with T wave flattening in lateral leads  Recent Labs: 09/09/2015: B Natriuretic Peptide 347.5 09/12/2015: Magnesium 2.1 12/20/2015: ALT 14; BUN 28; Creatinine, Ser 1.04; Hemoglobin 13.2; Platelets 273.0; Potassium 4.5; Sodium 141; TSH 2.43   Lipid Panel    Component Value Date/Time   CHOL 127 12/20/2015 0946   TRIG 108.0 12/20/2015 0946   TRIG 114 12/18/2006 1228   HDL 44.10 12/20/2015 0946   CHOLHDL 3 12/20/2015 0946   VLDL 21.6 12/20/2015 0946   LDLCALC 61 12/20/2015 0946    Additional studies/ records that were reviewed today include:  Echo 09/11/2015 LV EF: 55% -   60%  ------------------------------------------------------------------- Indications:      CHF - 428.0.  ------------------------------------------------------------------- History:   PMH:  Reflux. Obstructive sleep apnea.  Coronary artery disease.  Stroke.  Risk factors:  Hypertension. Diabetes mellitus. Dyslipidemia.  ------------------------------------------------------------------- Study Conclusions  - Left ventricle: The cavity size was normal. Wall thickness was   normal. Systolic function was normal. The estimated ejection   fraction was in the range of 55% to 60%. Wall motion was normal;   there were no regional wall motion abnormalities. Left   ventricular diastolic function parameters were normal. - Left atrium: The atrium was mildly dilated.   Myoview 01/16/2016  Nuclear stress EF: 54%.  The left ventricular ejection fraction is mildly decreased (45-54%).  There was no ST segment deviation noted during stress.  This is a low risk study.     Low risk stress nuclear study  with small, mild, fixed apical and inferior basal defects consistent with thinning; no ischemia; EF 54 with normal wall motion; mild LVE.   ASSESSMENT:    1. Coronary artery disease involving native coronary artery of native heart with angina pectoris ([place])   2. Chronic diastolic heart failure ([facility])   3. Carotid artery stenosis with cerebral infarction ([place])   4. OSA on CPAP   5. Essential hypertension   6. Hyperlipidemia      PLAN:  In order of problems listed above:  1. Progressive chest pain   - He says he never had any chest pain prior to the previous PCI, however EKG showed T-wave flattening in the lateral leads compared to the previous EKG. He already had 3 episodes last week, the longest episode was on Saturday lasting roughly 25 minutes resolved with 3 nitroglycerin. Since his previous cath was seen 2008, I have discussed with Dr. Sallyanne Kuster who agrees it is reasonable to pursue another diagnostic cardiac catheterization as repeat Myoview at this point likely will not offer much benefit given his just had one in January. He wish to have cardiac cath early next week as he still have some things to take care of this week.  - Risk and benefit of procedure explained to the patient who display clear understanding and agree to proceed. Discussed with patient possible procedural risk include bleeding, vascular injury, renal injury, arrythmia, MI, stroke and loss of limb or life.  2. CAD s/p Cypher DES to LAD 7/08  -  ETT Myoview on 02/04/2015, he did have chest pain during the Myoview at peak exercise with EKG changes post exercise including downsloping ST segment in the inferolateral leads, however image was normal with no evidence of ischemia, EF 59%.  - Myoview on 01/16/2016 which showed EF 54%, no significant ST changes, small mild, fixed apical and inferior basal defect consistent with thinning, and no ischemia was noted, overall low risk study  3. Chronic  diastolic heart failure: Last echo September 2016 which revealed EF 55-60%. Usually very well controlled on Lasix, does have trace pitting edema on physical exam today, however he did not take his Lasix today. His lung isclear, no other sign of significant heart failure.  4. PAD s/p L CEA in 8/12: Last seen by vascular surgery in March 2017, recommended one-year follow-up with repeat carotid Doppler  5. OSA on CPAP followed by Dr. Halford Chessman Pulmonology: Has seen in the office in February 2017, appears to be stable.  5. HTN: BP stable on current medication, systolic blood pressure mildly high in 140 today. However according to the patient, his blood pressure is usually in the 120s to 130s at his PCPs office. Will not treat this isolated blood pressure.  6. HLD: Lipitor 40 mg daily. Last lipid profile obtained in December 2016 shows cholesterol 127, triglyceride 108, HDL 44, LDL 61. Consider repeat lipid panel in December 2017.    Medication Adjustments/Labs and Tests Ordered: Current medicines are reviewed at length with the patient today.  Concerns regarding medicines are outlined above.  Medication changes, Labs and Tests ordered today are listed in the Patient Instructions below. Patient Instructions  Medication Instructions:  Your physician recommends that you continue on your current medications as directed. Please refer to the Current Medication list given to you today.   Labwork: Bmet, Cbc, Pt/Inr today  Testing/Procedures: Your physician has requested that you have a cardiac catheterization. Cardiac catheterization is used to diagnose and/or treat various heart conditions. Doctors may recommend  this procedure for a number of different reasons. The most common reason is to evaluate chest pain. Chest pain can be a symptom of coronary artery disease (CAD), and cardiac catheterization can show whether plaque is narrowing or blocking your heart's arteries. This procedure is also used to evaluate  the valves, as well as measure the blood flow and oxygen levels in different parts of your heart. For further information please visit [email or web address]. Please follow instruction sheet, as given.    Follow-Up: Your physician recommends that you schedule a follow-up appointment pending cath findings.   Any Other Special Instructions Will Be Listed Below (If Applicable). Please seek emergency care for chest pain lasting >30 minute not relieved with Nitroglycerin     If you need a refill on your cardiac medications before your next appointment, please call your pharmacy.      Hilbert Corrigan, [place]  08/21/2016 12:57 PM    [address], [place], [place]  [address] Phone: [phone]; Fax: [phone]

## 2016-08-22 LAB — PROTIME-INR
INR: 1
Prothrombin Time: 10.3 s (ref 9.0–11.5)

## 2016-08-27 ENCOUNTER — Inpatient Hospital Stay (HOSPITAL_COMMUNITY)
Admission: RE | Admit: 2016-08-27 | Discharge: 2016-08-29 | DRG: 247 | Disposition: A | Discharge status: Home / Self Care | Payer: Medicare Other | Source: Ambulatory Visit | Attending: Cardiovascular Disease | Admitting: Cardiovascular Disease

## 2016-08-27 ENCOUNTER — Encounter (HOSPITAL_COMMUNITY): Payer: Self-pay | Admitting: General Practice

## 2016-08-27 ENCOUNTER — Encounter (HOSPITAL_COMMUNITY)
Admission: RE | Disposition: A | Discharge status: Home / Self Care | Payer: Self-pay | Source: Ambulatory Visit | Attending: Cardiovascular Disease

## 2016-08-27 DIAGNOSIS — Z794 Long term (current) use of insulin: Secondary | ICD-10-CM

## 2016-08-27 DIAGNOSIS — E1151 Type 2 diabetes mellitus with diabetic peripheral angiopathy without gangrene: Secondary | ICD-10-CM | POA: Diagnosis not present

## 2016-08-27 DIAGNOSIS — I25110 Atherosclerotic heart disease of native coronary artery with unstable angina pectoris: Principal | ICD-10-CM

## 2016-08-27 DIAGNOSIS — Z8673 Personal history of transient ischemic attack (TIA), and cerebral infarction without residual deficits: Secondary | ICD-10-CM

## 2016-08-27 DIAGNOSIS — E785 Hyperlipidemia, unspecified: Secondary | ICD-10-CM | POA: Diagnosis present

## 2016-08-27 DIAGNOSIS — I200 Unstable angina: Secondary | ICD-10-CM | POA: Diagnosis not present

## 2016-08-27 DIAGNOSIS — G4733 Obstructive sleep apnea (adult) (pediatric): Secondary | ICD-10-CM | POA: Diagnosis not present

## 2016-08-27 DIAGNOSIS — R079 Chest pain, unspecified: Secondary | ICD-10-CM | POA: Diagnosis not present

## 2016-08-27 DIAGNOSIS — Z7982 Long term (current) use of aspirin: Secondary | ICD-10-CM

## 2016-08-27 DIAGNOSIS — E1065 Type 1 diabetes mellitus with hyperglycemia: Secondary | ICD-10-CM

## 2016-08-27 DIAGNOSIS — Z955 Presence of coronary angioplasty implant and graft: Secondary | ICD-10-CM | POA: Diagnosis not present

## 2016-08-27 DIAGNOSIS — IMO0002 Reserved for concepts with insufficient information to code with codable children: Secondary | ICD-10-CM

## 2016-08-27 DIAGNOSIS — I110 Hypertensive heart disease with heart failure: Secondary | ICD-10-CM | POA: Diagnosis present

## 2016-08-27 DIAGNOSIS — E108 Type 1 diabetes mellitus with unspecified complications: Secondary | ICD-10-CM

## 2016-08-27 DIAGNOSIS — I5031 Acute diastolic (congestive) heart failure: Secondary | ICD-10-CM

## 2016-08-27 DIAGNOSIS — I5032 Chronic diastolic (congestive) heart failure: Secondary | ICD-10-CM | POA: Diagnosis present

## 2016-08-27 HISTORY — DX: Dependence on other enabling machines and devices: Z99.89

## 2016-08-27 HISTORY — DX: Unspecified osteoarthritis, unspecified site: M19.90

## 2016-08-27 HISTORY — DX: Obstructive sleep apnea (adult) (pediatric): G47.33

## 2016-08-27 HISTORY — DX: Unspecified chronic bronchitis: J42

## 2016-08-27 HISTORY — DX: Cerebral infarction, unspecified: I63.9

## 2016-08-27 HISTORY — DX: Type 2 diabetes mellitus without complications: E11.9

## 2016-08-27 HISTORY — PX: CARDIAC CATHETERIZATION: SHX172

## 2016-08-27 HISTORY — DX: Calculus of kidney: N20.0

## 2016-08-27 HISTORY — DX: Pneumonia, unspecified organism: J18.9

## 2016-08-27 HISTORY — DX: Cardiac murmur, unspecified: R01.1

## 2016-08-27 LAB — GLUCOSE, CAPILLARY
Glucose-Capillary: 221 mg/dL — ABNORMAL HIGH (ref 65–99)
Glucose-Capillary: 223 mg/dL — ABNORMAL HIGH (ref 65–99)
Glucose-Capillary: 155 mg/dL — ABNORMAL HIGH (ref 65–99)
Glucose-Capillary: 187 mg/dL — ABNORMAL HIGH (ref 65–99)

## 2016-08-27 SURGERY — LEFT HEART CATH AND CORONARY ANGIOGRAPHY

## 2016-08-27 MED ORDER — LIDOCAINE HCL (PF) 1 % IJ SOLN
INTRAMUSCULAR | Status: AC
  Filled 2016-08-27: qty 30

## 2016-08-27 MED ORDER — HEPARIN (PORCINE) IN NACL 2-0.9 UNIT/ML-% IJ SOLN
INTRAMUSCULAR | Status: AC
  Filled 2016-08-27: qty 1000

## 2016-08-27 MED ORDER — ACETAMINOPHEN 325 MG PO TABS: 650.0000 mg | ORAL_TABLET | ORAL | Status: DC | PRN

## 2016-08-27 MED ORDER — SODIUM CHLORIDE 0.9 % WEIGHT BASED INFUSION
3.0000 mL/kg/h | INTRAVENOUS | Status: DC
  Administered 2016-08-27: 3 mL/kg/h via INTRAVENOUS

## 2016-08-27 MED ORDER — INSULIN ASPART 100 UNIT/ML ~~LOC~~ SOLN
0.0000 [IU] | Freq: Three times a day (TID) | SUBCUTANEOUS | Status: DC
  Administered 2016-08-27: 3 [IU] via SUBCUTANEOUS
  Administered 2016-08-27: 13:00:00 5 [IU] via SUBCUTANEOUS
  Administered 2016-08-28 – 2016-08-29 (×2): 3 [IU] via SUBCUTANEOUS

## 2016-08-27 MED ORDER — MIDAZOLAM HCL 2 MG/2ML IJ SOLN
INTRAMUSCULAR | Status: AC
  Filled 2016-08-27: qty 2

## 2016-08-27 MED ORDER — FENTANYL CITRATE (PF) 100 MCG/2ML IJ SOLN
INTRAMUSCULAR | Status: DC | PRN
  Administered 2016-08-27: 50 ug via INTRAVENOUS

## 2016-08-27 MED ORDER — INSULIN ASPART PROT & ASPART (70-30 MIX) 100 UNIT/ML ~~LOC~~ SUSP
25.0000 [IU] | Freq: Every day | SUBCUTANEOUS | Status: DC
  Administered 2016-08-27: 25 [IU] via SUBCUTANEOUS
  Filled 2016-08-27 (×2): qty 10

## 2016-08-27 MED ORDER — PANTOPRAZOLE SODIUM 40 MG PO TBEC
40.0000 mg | DELAYED_RELEASE_TABLET | Freq: Every day | ORAL | Status: DC
  Administered 2016-08-27 – 2016-08-28 (×2): 40 mg via ORAL
  Filled 2016-08-27 (×3): qty 1

## 2016-08-27 MED ORDER — LISINOPRIL 40 MG PO TABS
40.0000 mg | ORAL_TABLET | Freq: Every day | ORAL | Status: DC
  Administered 2016-08-28: 10:00:00 40 mg via ORAL
  Filled 2016-08-27 (×2): qty 1

## 2016-08-27 MED ORDER — TICLOPIDINE HCL 250 MG PO TABS
250.0000 mg | ORAL_TABLET | Freq: Two times a day (BID) | ORAL | Status: DC
Start: 2016-08-27 — End: 2016-08-27

## 2016-08-27 MED ORDER — SODIUM CHLORIDE 0.9% FLUSH: 3.0000 mL | INTRAVENOUS | Status: DC | PRN

## 2016-08-27 MED ORDER — NITROGLYCERIN 1 MG/10 ML FOR IR/CATH LAB
INTRA_ARTERIAL | Status: AC
  Filled 2016-08-27: qty 10

## 2016-08-27 MED ORDER — VERAPAMIL HCL 2.5 MG/ML IV SOLN
INTRAVENOUS | Status: AC
  Filled 2016-08-27: qty 2

## 2016-08-27 MED ORDER — ASPIRIN 81 MG PO CHEW: 81.0000 mg | CHEWABLE_TABLET | ORAL | Status: DC

## 2016-08-27 MED ORDER — SPIRONOLACTONE 25 MG PO TABS
25.0000 mg | ORAL_TABLET | Freq: Every day | ORAL | Status: DC
  Administered 2016-08-28: 10:00:00 25 mg via ORAL
  Filled 2016-08-27 (×2): qty 1

## 2016-08-27 MED ORDER — VERAPAMIL HCL 2.5 MG/ML IV SOLN
INTRAVENOUS | Status: DC | PRN
  Administered 2016-08-27: 10 mL via INTRA_ARTERIAL

## 2016-08-27 MED ORDER — ONDANSETRON HCL 4 MG/2ML IJ SOLN: 4.0000 mg | Freq: Four times a day (QID) | INTRAMUSCULAR | Status: DC | PRN

## 2016-08-27 MED ORDER — HEPARIN (PORCINE) IN NACL 2-0.9 UNIT/ML-% IJ SOLN
INTRAMUSCULAR | Status: DC | PRN
  Administered 2016-08-27: 1000 mL

## 2016-08-27 MED ORDER — SODIUM CHLORIDE 0.9% FLUSH: 3.0000 mL | Freq: Two times a day (BID) | INTRAVENOUS | Status: DC

## 2016-08-27 MED ORDER — SODIUM CHLORIDE 0.9 % IV SOLN: 250.0000 mL | INTRAVENOUS | Status: DC | PRN

## 2016-08-27 MED ORDER — MIDAZOLAM HCL 2 MG/2ML IJ SOLN
INTRAMUSCULAR | Status: DC | PRN
  Administered 2016-08-27: 2 mg via INTRAVENOUS
  Administered 2016-08-27: 1 mg via INTRAVENOUS

## 2016-08-27 MED ORDER — NITROGLYCERIN 1 MG/10 ML FOR IR/CATH LAB
INTRA_ARTERIAL | Status: DC | PRN
  Administered 2016-08-27: 200 ug via INTRA_ARTERIAL

## 2016-08-27 MED ORDER — IOPAMIDOL (ISOVUE-370) INJECTION 76%
INTRAVENOUS | Status: AC
  Filled 2016-08-27: qty 50

## 2016-08-27 MED ORDER — FUROSEMIDE 10 MG/ML IJ SOLN
20.0000 mg | Freq: Once | INTRAMUSCULAR | Status: AC
  Administered 2016-08-28: 20:00:00 20 mg via INTRAVENOUS
  Filled 2016-08-27 (×2): qty 2

## 2016-08-27 MED ORDER — NITROGLYCERIN 0.4 MG SL SUBL
0.4000 mg | SUBLINGUAL_TABLET | SUBLINGUAL | Status: DC | PRN
Start: 2016-08-27 — End: 2016-08-29

## 2016-08-27 MED ORDER — IOPAMIDOL (ISOVUE-370) INJECTION 76%
INTRAVENOUS | Status: DC | PRN
  Administered 2016-08-27: 90 mL

## 2016-08-27 MED ORDER — LIDOCAINE HCL (PF) 1 % IJ SOLN
INTRAMUSCULAR | Status: DC | PRN
  Administered 2016-08-27: 3 mL

## 2016-08-27 MED ORDER — ASPIRIN 81 MG PO CHEW
81.0000 mg | CHEWABLE_TABLET | Freq: Every day | ORAL | Status: DC
  Administered 2016-08-28 – 2016-08-29 (×2): 81 mg via ORAL
  Filled 2016-08-27 (×2): qty 1

## 2016-08-27 MED ORDER — SODIUM CHLORIDE 0.9 % IV SOLN
INTRAVENOUS | Status: DC
  Administered 2016-08-27: 18:00:00 via INTRAVENOUS

## 2016-08-27 MED ORDER — SODIUM CHLORIDE 0.9% FLUSH
3.0000 mL | Freq: Two times a day (BID) | INTRAVENOUS | Status: DC
  Administered 2016-08-27 – 2016-08-29 (×3): 3 mL via INTRAVENOUS

## 2016-08-27 MED ORDER — HEPARIN SODIUM (PORCINE) 1000 UNIT/ML IJ SOLN
INTRAMUSCULAR | Status: DC | PRN
  Administered 2016-08-27: 6000 [IU] via INTRAVENOUS

## 2016-08-27 MED ORDER — AMLODIPINE BESYLATE 10 MG PO TABS
10.0000 mg | ORAL_TABLET | Freq: Every day | ORAL | Status: DC
  Administered 2016-08-28 – 2016-08-29 (×2): 10 mg via ORAL
  Filled 2016-08-27 (×2): qty 1

## 2016-08-27 MED ORDER — ATENOLOL 50 MG PO TABS
100.0000 mg | ORAL_TABLET | Freq: Two times a day (BID) | ORAL | Status: DC
  Administered 2016-08-28 – 2016-08-29 (×3): 100 mg via ORAL
  Filled 2016-08-27 (×4): qty 2

## 2016-08-27 MED ORDER — ALPRAZOLAM 0.5 MG PO TABS
1.0000 mg | ORAL_TABLET | Freq: Two times a day (BID) | ORAL | Status: DC | PRN
Start: 2016-08-27 — End: 2016-08-29

## 2016-08-27 MED ORDER — HEPARIN SODIUM (PORCINE) 1000 UNIT/ML IJ SOLN
INTRAMUSCULAR | Status: AC
  Filled 2016-08-27: qty 1

## 2016-08-27 MED ORDER — FENTANYL CITRATE (PF) 100 MCG/2ML IJ SOLN
INTRAMUSCULAR | Status: AC
  Filled 2016-08-27: qty 2

## 2016-08-27 MED ORDER — ZOLPIDEM TARTRATE 5 MG PO TABS
5.0000 mg | ORAL_TABLET | Freq: Every evening | ORAL | Status: DC | PRN
Start: 2016-08-27 — End: 2016-08-29

## 2016-08-27 MED ORDER — SODIUM CHLORIDE 0.9 % IV SOLN
INTRAVENOUS | Status: DC
  Administered 2016-08-27: 10:00:00 via INTRAVENOUS

## 2016-08-27 MED ORDER — DIAZEPAM 5 MG PO TABS: 5.0000 mg | ORAL_TABLET | Freq: Four times a day (QID) | ORAL | Status: DC | PRN

## 2016-08-27 MED ORDER — ATORVASTATIN CALCIUM 40 MG PO TABS
40.0000 mg | ORAL_TABLET | Freq: Every day | ORAL | Status: DC
  Administered 2016-08-27: 17:00:00 40 mg via ORAL
  Filled 2016-08-27: qty 1

## 2016-08-27 MED ORDER — IOPAMIDOL (ISOVUE-370) INJECTION 76%
INTRAVENOUS | Status: AC
  Filled 2016-08-27: qty 100

## 2016-08-27 MED ORDER — SODIUM CHLORIDE 0.9 % WEIGHT BASED INFUSION: 1.0000 mL/kg/h | INTRAVENOUS | Status: DC

## 2016-08-27 SURGICAL SUPPLY — 14 items
CATH INFINITI 4FR 145 PIGTAIL (CATHETERS) ×2 IMPLANT
CATH INFINITI 5 FR JL3.5 (CATHETERS) ×2 IMPLANT
CATH INFINITI 5FR ANG PIGTAIL (CATHETERS) ×2 IMPLANT
CATH INFINITI 5FR JL4 (CATHETERS) ×2 IMPLANT
CATH OPTITORQUE JACKY 4.0 5F (CATHETERS) ×2 IMPLANT
DEVICE RAD COMP TR BAND LRG (VASCULAR PRODUCTS) ×2 IMPLANT
GLIDESHEATH SLEND SS 6F .021 (SHEATH) ×2 IMPLANT
KIT HEART LEFT (KITS) ×2 IMPLANT
PACK CARDIAC CATHETERIZATION (CUSTOM PROCEDURE TRAY) ×2 IMPLANT
SYR MEDRAD MARK V 150ML (SYRINGE) ×2 IMPLANT
TRANSDUCER W/STOPCOCK (MISCELLANEOUS) ×2 IMPLANT
TUBING CIL FLEX 10 FLL-RA (TUBING) ×2 IMPLANT
WIRE HI TORQ VERSACORE-J 145CM (WIRE) ×2 IMPLANT
WIRE SAFE-T 1.5MM-J .035X260CM (WIRE) ×4 IMPLANT

## 2016-08-27 NOTE — Research (Signed)
LEADERS FREE II Informed Consent   Subject Name: [name]  Subject met inclusion and exclusion criteria.  The informed consent form, study requirements and expectations were reviewed with the subject and questions and concerns were addressed prior to the signing of the consent form.  The subject verbalized understanding of the trail requirements.  The subject agreed to participate in the LEADERS FREE II trial and signed the informed consent.  The informed consent was obtained prior to performance of any protocol-specific procedures for the subject.  A copy of the signed informed consent was given to the subject and a copy was placed in the subject's medical record.  Jacob Bathe Jr. 08/27/2016, 9536 AM

## 2016-08-27 NOTE — Interval H&P Note (Signed)
Cath Lab Visit (complete for each Cath Lab visit)  Clinical Evaluation Leading to the Procedure:   ACS: No.  Non-ACS:    Anginal Classification: CCS III  Anti-ischemic medical therapy: Maximal Therapy (2 or more classes of medications)  Non-Invasive Test Results: No non-invasive testing performed  Prior CABG: No previous CABG      History and Physical Interval Note:  08/27/2016 7:50 AM  [name]  has presented today for surgery, with the diagnosis of cad  The various methods of treatment have been discussed with the patient and family. After consideration of risks, benefits and other options for treatment, the patient has consented to  Procedure(s): Left Heart Cath and Coronary Angiography (N/A) as a surgical intervention .  The patient's history has been reviewed, patient examined, no change in status, stable for surgery.  I have reviewed the patient's chart and labs.  Questions were answered to the patient's satisfaction.     Shelva Majestic

## 2016-08-27 NOTE — H&P (View-Only) (Signed)
Cardiology Office Note    Date:  08/21/2016   ID:  [name], DOB October 28, 1941, MRN [id number]  PCP:  Dorothyann Peng, NP  Cardiologist:  Dr. Stanford Breed  Chief Complaint  Patient presents with  . Follow-up    seen for Dr. Stanford Breed    History of Present Illness:  [name] is a 75 y.o. male with PMH of CAD s/p Cypher DES to LAD Q000111Q, Chronic diastolic heart failure, OSA on CPAP followed by Dr. Halford Chessman Pulmonology, HTN, HLD, CVA and PAD s/p L CEA in 8/12. He had an ETT Myoview on 02/04/2015, he did have chest pain during the Myoview at peak exercise with EKG changes post exercise including downsloping ST segment in the inferolateral leads, however image was normal with no evidence of ischemia, EF 59%. He also has chronic edema and history of CVA. Abdominal ultrasound obtained in 5/16 showed no aneurysm. Echocardiogram obtained in September 2016 showed normal LV function, mild LAE. Patient was last seen in cardiology clinic by Dr. Stanford Breed on 01/04/2016, at which time he still have some dyspnea on exertion however denies any orthopnea, PND or palpitation. It was felt his dyspnea is likely multifactorial including obesity, deconditioning, obstructive sleep apnea and a chronic diastolic congestive heart failure. He did undergo a repeat outpatient Myoview on 01/16/2016 which showed EF 54%, no significant ST changes, small mild, fixed apical and inferior basal defect consistent with thinning, and no ischemia was noted, overall low risk study. He presents today for 73-month cardiology follow-up.  He was seen by Dr. Halford Chessman in February 2017, at which time he was compliant with CPAP machine, it was recommended for him to continue on current sleep apnea. He was seen by vascular surgery in March 26 17, he was doing well from carotid perspective, follow-up in one year with carotid duplex scan was recommended. However, since last week, he has had 3 episodes of chest pain, all occurred at rest. He says  he never had chest pain with previous angina, as always been worsening dyspnea on exertion. He does have chronic dyspnea on exertion at baseline which he says is only slightly worse in the last year. The worst episode chest pain was last Saturday, and it lasted roughly 25 minutes and resolved only after 3 nitroglycerin. He did not seek medical attention at the time. He does have trace edema on physical exam, however since he did not take his Lasix today. His fluid status is usually very well-managed on the current dose of Lasix. His lung is clear. He is blood pressure today is mildly elevated however he says his blood pressure is usually well in the 120s when he visited his PCP. I would not increase blood pressure medication at this time. As far as his chest pain, although he did not have chest pain with previous angina, his EKG today does show some more prominent T-wave flattening in the lateral leads. I have discussed with Dr. Sallyanne Kuster, I do not think it is beneficial for him to have a repeat Myoview since he just had one in January. After discussing various risk and benefit, he eventually agreed to undergo diagnostic cardiac catheterization.    Past Medical History:  Diagnosis Date  . Acute diastolic congestive heart failure ([place]) 09/20/2015  . CAD (coronary artery disease)    a. 06/2007 PCI LAD->Cypher DES;  b. 02/2010 Cath LM 30-40, LAD 40-83m, patent stent, D2 50, RI 70 ost, LCX 50, OM 30, RCA 30-74m, 30d, PDA 50, RPL 40-50p, nl  EF.  . Carotid artery occlusion    a. 07/2011 L CEA;  b. 02/2012 Carotid U/S: RICA [id number], LICA patent.  . Chronic back pain   . CVA (cerebral infarction) 2001, 2004  . Diabetes mellitus   . GERD (gastroesophageal reflux disease)   . History of blood transfusion   . Hyperlipidemia   . Hypertension   . OSA (obstructive sleep apnea) 07/20/2015  . Peripheral vascular disease ([place]) 2001    Past Surgical History:  Procedure Laterality Date  . ANGIOPLASTY  2008, 2010    stent placement 2008  . CAROTID ENDARTERECTOMY Left Aug. 24, 2012   cea  . Heart stent  06-30-07  . [place]   right  . kidney stone removal  1969  . PR VEIN BYPASS GRAFT,AORTO-FEM-POP  2008    Current Medications: Outpatient Medications Prior to Visit  Medication Sig Dispense Refill  . albuterol (PROVENTIL HFA;VENTOLIN HFA) 108 (90 Base) MCG/ACT inhaler Inhale 2 puffs into the lungs every 6 (six) hours as needed for wheezing or shortness of breath. 1 Inhaler 2  . amLODipine (NORVASC) 10 MG tablet Take 1 tablet (10 mg total) by mouth daily. 90 tablet 3  . aspirin 325 MG tablet Take 325 mg by mouth daily.     [name] atenolol (TENORMIN) 100 MG tablet Take 1 tablet (100 mg total) by mouth 2 (two) times daily. 200 tablet 1  . atorvastatin (LIPITOR) 40 MG tablet Take 1 tablet (40 mg total) by mouth daily at 6 PM. 90 tablet 3  . furosemide (LASIX) 20 MG tablet Take 1 tablet (20 mg total) by mouth daily. 90 tablet 3  . furosemide (LASIX) 40 MG tablet Take 1 tablet (40 mg total) by mouth daily. 90 tablet 3  . glipiZIDE (GLUCOTROL) 10 MG tablet TAKE 1 TABLET (10 MG TOTAL) BY MOUTH 2 (TWO) TIMES DAILY BEFORE A MEAL. 200 tablet 3  . Insulin Glargine (BASAGLAR KWIKPEN) 100 UNIT/ML SOPN Inject 15 Units into the skin at bedtime.     [name] lisinopril (PRINIVIL,ZESTRIL) 40 MG tablet Take 1 tablet (40 mg total) by mouth daily. 100 tablet 3  . metFORMIN (GLUCOPHAGE) 1000 MG tablet TAKE 1 TABLET BY MOUTH TWICE DAILY WITH MEALS 200 tablet 0  . nitroGLYCERIN (NITROSTAT) 0.4 MG SL tablet PLACE 1 TABLET (0.4 MG TOTAL) UNDER THE TONGUE EVERY 5 (FIVE) MINUTES AS NEEDED. 25 tablet 0  . omeprazole (PRILOSEC) 20 MG capsule TAKE ONE CAPSULE BY MOUTH EVERY DAY 90 capsule 3  . spironolactone (ALDACTONE) 25 MG tablet Take 1 tablet (25 mg total) by mouth daily. 90 tablet 3  . Insulin Pen Needle 32G X 4 MM MISC Use once daily. Dx E11.9 100 each 3  . ONE TOUCH ULTRA TEST test strip TEST EVERY DAY 100 each 3  .  furosemide (LASIX) 40 MG tablet TAKE 1 TABLET BY MOUTH IN THE AM, 1/2 TABLET IN THE EVENING AS DIRECTED *TIME FOR AN OFFICE VISIT* 60 tablet 0  . Insulin Lispro Prot & Lispro (HUMALOG 75/25 MIX) (75-25) 100 UNIT/ML Kwikpen Inject 30 Units into the skin 1 day or 1 dose. INJECT 30 UNITS INTO THE SKIN DAILY WITH SUPPER. (Patient not taking: Reported on 07/24/2016) 2 pen 3  . metFORMIN (GLUCOPHAGE) 1000 MG tablet Take 1 tablet (1,000 mg total) by mouth 2 (two) times daily with a meal. 200 tablet 0   No facility-administered medications prior to visit.      Allergies:   Clopidogrel bisulfate and Adhesive [tape]  Social History   Social History  . Marital status: Married    Spouse name: N/A  . Number of children: N/A  . Years of education: N/A   Occupational History  . Medical illustrator    Social History Main Topics  . Smoking status: Never Smoker  . Smokeless tobacco: Never Used  . Alcohol use No  . Drug use: No  . Sexual activity: Not Asked   Other Topics Concern  . None   Social History Narrative   Works part time as an Medical illustrator.   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    One son who lives in [place] - son is adopted.    She takes care of his wife, she is oxygen continuously.      Family History:  The patient's family history includes COPD in his brother; Cancer in his brother and brother; Heart disease in his father and mother.   ROS:   Please see the history of present illness.    ROS All other systems reviewed and are negative.   PHYSICAL EXAM:   VS:  BP 140/72   Pulse (!) 59   Ht 5' 9.5" (1.765 m)   Wt 265 lb 6.4 oz (120.4 kg)   BMI 38.63 kg/m    GEN: Well nourished, well developed, in no acute distress  HEENT: normal  Neck: no JVD, carotid bruits, or masses Cardiac: RRR; no murmurs, rubs, or gallops,no edema  Respiratory:  clear to auscultation bilaterally, normal work of breathing GI: soft, nontender, nondistended, + BS MS: no deformity or atrophy  Skin: warm and dry,  no rash Neuro:  Alert and Oriented x 3, Strength and sensation are intact Psych: euthymic mood, full affect  Wt Readings from Last 3 Encounters:  08/21/16 265 lb 6.4 oz (120.4 kg)  06/26/16 260 lb (117.9 kg)  03/20/16 260 lb (117.9 kg)      Studies/Labs Reviewed:   EKG:  EKG is ordered today.  The ekg ordered today demonstrates sinus brady with T wave flattening in lateral leads  Recent Labs: 09/09/2015: B Natriuretic Peptide 347.5 09/12/2015: Magnesium 2.1 12/20/2015: ALT 14; BUN 28; Creatinine, Ser 1.04; Hemoglobin 13.2; Platelets 273.0; Potassium 4.5; Sodium 141; TSH 2.43   Lipid Panel    Component Value Date/Time   CHOL 127 12/20/2015 0946   TRIG 108.0 12/20/2015 0946   TRIG 114 12/18/2006 1228   HDL 44.10 12/20/2015 0946   CHOLHDL 3 12/20/2015 0946   VLDL 21.6 12/20/2015 0946   LDLCALC 61 12/20/2015 0946    Additional studies/ records that were reviewed today include:  Echo 09/11/2015 LV EF: 55% -   60%  ------------------------------------------------------------------- Indications:      CHF - 428.0.  ------------------------------------------------------------------- History:   PMH:  Reflux. Obstructive sleep apnea.  Coronary artery disease.  Stroke.  Risk factors:  Hypertension. Diabetes mellitus. Dyslipidemia.  ------------------------------------------------------------------- Study Conclusions  - Left ventricle: The cavity size was normal. Wall thickness was   normal. Systolic function was normal. The estimated ejection   fraction was in the range of 55% to 60%. Wall motion was normal;   there were no regional wall motion abnormalities. Left   ventricular diastolic function parameters were normal. - Left atrium: The atrium was mildly dilated.   Myoview 01/16/2016  Nuclear stress EF: 54%.  The left ventricular ejection fraction is mildly decreased (45-54%).  There was no ST segment deviation noted during stress.  This is a low risk study.     Low risk stress nuclear study  with small, mild, fixed apical and inferior basal defects consistent with thinning; no ischemia; EF 54 with normal wall motion; mild LVE.   ASSESSMENT:    1. Coronary artery disease involving native coronary artery of native heart with angina pectoris ([place])   2. Chronic diastolic heart failure (H[facility])   3. Carotid artery stenosis with cerebral infarction ([place])   4. OSA on CPAP   5. Essential hypertension   6. Hyperlipidemia      PLAN:  In order of problems listed above:  1. Progressive chest pain   - He says he never had any chest pain prior to the previous PCI, however EKG showed T-wave flattening in the lateral leads compared to the previous EKG. He already had 3 episodes last week, the longest episode was on Saturday lasting roughly 25 minutes resolved with 3 nitroglycerin. Since his previous cath was seen 2008, I have discussed with Dr. Sallyanne Kuster who agrees it is reasonable to pursue another diagnostic cardiac catheterization as repeat Myoview at this point likely will not offer much benefit given his just had one in January. He wish to have cardiac cath early next week as he still have some things to take care of this week.  - Risk and benefit of procedure explained to the patient who display clear understanding and agree to proceed. Discussed with patient possible procedural risk include bleeding, vascular injury, renal injury, arrythmia, MI, stroke and loss of limb or life.  2. CAD s/p Cypher DES to LAD 7/08  -  ETT Myoview on 02/04/2015, he did have chest pain during the Myoview at peak exercise with EKG changes post exercise including downsloping ST segment in the inferolateral leads, however image was normal with no evidence of ischemia, EF 59%.  - Myoview on 01/16/2016 which showed EF 54%, no significant ST changes, small mild, fixed apical and inferior basal defect consistent with thinning, and no ischemia was noted, overall low risk study  3. Chronic  diastolic heart failure: Last echo September 2016 which revealed EF 55-60%. Usually very well controlled on Lasix, does have trace pitting edema on physical exam today, however he did not take his Lasix today. His lung isclear, no other sign of significant heart failure.  4. PAD s/p L CEA in 8/12: Last seen by vascular surgery in March 2017, recommended one-year follow-up with repeat carotid Doppler  5. OSA on CPAP followed by Dr. Halford Chessman Pulmonology: Has seen in the office in February 2017, appears to be stable.  5. HTN: BP stable on current medication, systolic blood pressure mildly high in 140 today. However according to the patient, his blood pressure is usually in the 120s to 130s at his PCPs office. Will not treat this isolated blood pressure.  6. HLD: Lipitor 40 mg daily. Last lipid profile obtained in December 2016 shows cholesterol 127, triglyceride 108, HDL 44, LDL 61. Consider repeat lipid panel in December 2017.    Medication Adjustments/Labs and Tests Ordered: Current medicines are reviewed at length with the patient today.  Concerns regarding medicines are outlined above.  Medication changes, Labs and Tests ordered today are listed in the Patient Instructions below. Patient Instructions  Medication Instructions:  Your physician recommends that you continue on your current medications as directed. Please refer to the Current Medication list given to you today.   Labwork: Bmet, Cbc, Pt/Inr today  Testing/Procedures: Your physician has requested that you have a cardiac catheterization. Cardiac catheterization is used to diagnose and/or treat various heart conditions. Doctors may recommend  this procedure for a number of different reasons. The most common reason is to evaluate chest pain. Chest pain can be a symptom of coronary artery disease (CAD), and cardiac catheterization can show whether plaque is narrowing or blocking your heart's arteries. This procedure is also used to evaluate  the valves, as well as measure the blood flow and oxygen levels in different parts of your heart. For further information please visit [email or web address]. Please follow instruction sheet, as given.    Follow-Up: Your physician recommends that you schedule a follow-up appointment pending cath findings.   Any Other Special Instructions Will Be Listed Below (If Applicable). Please seek emergency care for chest pain lasting >30 minute not relieved with Nitroglycerin     If you need a refill on your cardiac medications before your next appointment, please call your pharmacy.      Hilbert Corrigan, [place]  08/21/2016 12:57 PM    [address], [place], [place]  [address] Phone: [phone]; Fax: [phone]

## 2016-08-28 ENCOUNTER — Encounter (HOSPITAL_COMMUNITY)
Admission: RE | Disposition: A | Discharge status: Home / Self Care | Payer: Self-pay | Source: Ambulatory Visit | Attending: Cardiovascular Disease

## 2016-08-28 ENCOUNTER — Encounter (HOSPITAL_COMMUNITY): Payer: Self-pay | Admitting: Cardiovascular Disease

## 2016-08-28 DIAGNOSIS — I25110 Atherosclerotic heart disease of native coronary artery with unstable angina pectoris: Secondary | ICD-10-CM | POA: Diagnosis not present

## 2016-08-28 DIAGNOSIS — I200 Unstable angina: Secondary | ICD-10-CM

## 2016-08-28 HISTORY — PX: CARDIAC CATHETERIZATION: SHX172

## 2016-08-28 LAB — BASIC METABOLIC PANEL
Anion gap: 7 (ref 5–15)
BUN: 11 mg/dL (ref 6–20)
CO2: 23 mmol/L (ref 22–32)
Calcium: 8.4 mg/dL — ABNORMAL LOW (ref 8.9–10.3)
Chloride: 109 mmol/L (ref 101–111)
Creatinine, Ser: 0.69 mg/dL (ref 0.61–1.24)
GFR calc Af Amer: >60 mL/min (ref >60)
GFR calc non Af Amer: >60 mL/min (ref >60)
Glucose, Bld: 140 mg/dL — ABNORMAL HIGH (ref 65–99)
Potassium: 4 mmol/L (ref 3.5–5.1)
Sodium: 139 mmol/L (ref 135–145)

## 2016-08-28 LAB — GLUCOSE, CAPILLARY
Glucose-Capillary: 136 mg/dL — ABNORMAL HIGH (ref 65–99)
Glucose-Capillary: 173 mg/dL — ABNORMAL HIGH (ref 65–99)
Glucose-Capillary: 179 mg/dL — ABNORMAL HIGH (ref 65–99)
Glucose-Capillary: 251 mg/dL — ABNORMAL HIGH (ref 65–99)

## 2016-08-28 LAB — CBC
HCT: 35.6 % — ABNORMAL LOW (ref 39.0–52.0)
Hemoglobin: 11.5 g/dL — ABNORMAL LOW (ref 13.0–17.0)
MCH: 28.5 pg (ref 26.0–34.0)
MCHC: 32.3 g/dL (ref 30.0–36.0)
MCV: 88.1 fL (ref 78.0–100.0)
Platelets: 188 10*3/uL (ref 150–400)
RBC: 4.04 MIL/uL — ABNORMAL LOW (ref 4.22–5.81)
RDW: 15.7 % — ABNORMAL HIGH (ref 11.5–15.5)
WBC: 8.8 10*3/uL (ref 4.0–10.5)

## 2016-08-28 LAB — POCT ACTIVATED CLOTTING TIME: Activated Clotting Time: 384 seconds

## 2016-08-28 SURGERY — CORONARY STENT INTERVENTION

## 2016-08-28 MED ORDER — BIVALIRUDIN BOLUS VIA INFUSION - CUPID
INTRAVENOUS | Status: DC | PRN
  Administered 2016-08-28: 91.5 mg via INTRAVENOUS

## 2016-08-28 MED ORDER — SODIUM CHLORIDE 0.9 % IV SOLN
INTRAVENOUS | Status: DC
  Administered 2016-08-28 – 2016-08-29 (×2): via INTRAVENOUS

## 2016-08-28 MED ORDER — HEPARIN (PORCINE) IN NACL 2-0.9 UNIT/ML-% IJ SOLN
INTRAMUSCULAR | Status: AC
  Filled 2016-08-28: qty 1000

## 2016-08-28 MED ORDER — ATORVASTATIN CALCIUM 80 MG PO TABS
80.0000 mg | ORAL_TABLET | Freq: Every day | ORAL | Status: DC
  Administered 2016-08-28: 80 mg via ORAL
  Filled 2016-08-28: qty 1

## 2016-08-28 MED ORDER — SODIUM CHLORIDE 0.9% FLUSH
3.0000 mL | Freq: Two times a day (BID) | INTRAVENOUS | Status: DC
  Administered 2016-08-28: 3 mL via INTRAVENOUS

## 2016-08-28 MED ORDER — SODIUM CHLORIDE 0.9 % IV SOLN: INTRAVENOUS | Status: DC

## 2016-08-28 MED ORDER — HEPARIN (PORCINE) IN NACL 2-0.9 UNIT/ML-% IJ SOLN
INTRAMUSCULAR | Status: AC
  Filled 2016-08-28: qty 500

## 2016-08-28 MED ORDER — TICAGRELOR 90 MG PO TABS
180.0000 mg | ORAL_TABLET | ORAL | Status: AC
  Administered 2016-08-28: 180 mg via ORAL
  Filled 2016-08-28: qty 2

## 2016-08-28 MED ORDER — ACETAMINOPHEN 325 MG PO TABS: 650.0000 mg | ORAL_TABLET | ORAL | Status: DC | PRN

## 2016-08-28 MED ORDER — HEPARIN (PORCINE) IN NACL 2-0.9 UNIT/ML-% IJ SOLN
INTRAMUSCULAR | Status: DC | PRN
  Administered 2016-08-28: 16:00:00

## 2016-08-28 MED ORDER — SODIUM CHLORIDE 0.9% FLUSH: 3.0000 mL | INTRAVENOUS | Status: DC | PRN

## 2016-08-28 MED ORDER — SODIUM CHLORIDE 0.9 % IV SOLN
INTRAVENOUS | Status: DC | PRN
  Administered 2016-08-28 (×2): 1.75 mg/kg/h via INTRAVENOUS

## 2016-08-28 MED ORDER — NITROGLYCERIN 1 MG/10 ML FOR IR/CATH LAB
INTRA_ARTERIAL | Status: AC
  Filled 2016-08-28: qty 10

## 2016-08-28 MED ORDER — SODIUM CHLORIDE 0.9 % IV SOLN
INTRAVENOUS | Status: DC
  Administered 2016-08-28: 12:00:00 via INTRAVENOUS

## 2016-08-28 MED ORDER — FENTANYL CITRATE (PF) 100 MCG/2ML IJ SOLN
INTRAMUSCULAR | Status: AC
  Filled 2016-08-28: qty 2

## 2016-08-28 MED ORDER — SODIUM CHLORIDE 0.9% FLUSH
3.0000 mL | Freq: Two times a day (BID) | INTRAVENOUS | Status: DC
  Administered 2016-08-28 – 2016-08-29 (×2): 3 mL via INTRAVENOUS

## 2016-08-28 MED ORDER — SODIUM CHLORIDE 0.9 % IV SOLN: 250.0000 mL | INTRAVENOUS | Status: DC | PRN

## 2016-08-28 MED ORDER — IOPAMIDOL (ISOVUE-370) INJECTION 76%
INTRAVENOUS | Status: AC
Start: 2016-08-28 — End: 2016-08-28
  Filled 2016-08-28: qty 50

## 2016-08-28 MED ORDER — ASPIRIN 81 MG PO CHEW: 81.0000 mg | CHEWABLE_TABLET | ORAL | Status: DC

## 2016-08-28 MED ORDER — ONDANSETRON HCL 4 MG/2ML IJ SOLN: 4.0000 mg | Freq: Four times a day (QID) | INTRAMUSCULAR | Status: DC | PRN

## 2016-08-28 MED ORDER — IOPAMIDOL (ISOVUE-370) INJECTION 76%
INTRAVENOUS | Status: DC | PRN
  Administered 2016-08-28: 245 mL via INTRAVENOUS

## 2016-08-28 MED ORDER — ASPIRIN 81 MG PO CHEW: 81.0000 mg | CHEWABLE_TABLET | Freq: Every day | ORAL | Status: DC

## 2016-08-28 MED ORDER — FENTANYL CITRATE (PF) 100 MCG/2ML IJ SOLN
INTRAMUSCULAR | Status: DC | PRN
  Administered 2016-08-28: 50 ug via INTRAVENOUS

## 2016-08-28 MED ORDER — IOPAMIDOL (ISOVUE-370) INJECTION 76%
INTRAVENOUS | Status: AC
  Filled 2016-08-28: qty 100

## 2016-08-28 MED ORDER — LIDOCAINE HCL (PF) 1 % IJ SOLN
INTRAMUSCULAR | Status: DC | PRN
  Administered 2016-08-28: 15 mL via SUBCUTANEOUS

## 2016-08-28 MED ORDER — NITROGLYCERIN 1 MG/10 ML FOR IR/CATH LAB
INTRA_ARTERIAL | Status: DC | PRN
Start: 2016-08-28 — End: 2016-08-28
  Administered 2016-08-28 (×3): 200 ug via INTRACORONARY

## 2016-08-28 MED ORDER — MIDAZOLAM HCL 2 MG/2ML IJ SOLN
INTRAMUSCULAR | Status: DC | PRN
  Administered 2016-08-28: 2 mg via INTRAVENOUS

## 2016-08-28 MED ORDER — TICAGRELOR 90 MG PO TABS: 90.0000 mg | ORAL_TABLET | Freq: Two times a day (BID) | ORAL | Status: DC

## 2016-08-28 MED ORDER — BIVALIRUDIN 250 MG IV SOLR
INTRAVENOUS | Status: AC
  Filled 2016-08-28: qty 250

## 2016-08-28 MED ORDER — MIDAZOLAM HCL 2 MG/2ML IJ SOLN
INTRAMUSCULAR | Status: AC
  Filled 2016-08-28: qty 2

## 2016-08-28 MED ORDER — IOPAMIDOL (ISOVUE-370) INJECTION 76%
INTRAVENOUS | Status: AC
  Filled 2016-08-28: qty 50

## 2016-08-28 MED ORDER — LIDOCAINE HCL (PF) 1 % IJ SOLN
INTRAMUSCULAR | Status: AC
  Filled 2016-08-28: qty 30

## 2016-08-28 MED ORDER — MORPHINE SULFATE (PF) 2 MG/ML IV SOLN: 2.0000 mg | INTRAVENOUS | Status: DC | PRN

## 2016-08-28 MED ORDER — TICAGRELOR 90 MG PO TABS
90.0000 mg | ORAL_TABLET | Freq: Two times a day (BID) | ORAL | Status: DC
  Administered 2016-08-28 – 2016-08-29 (×2): 90 mg via ORAL
  Filled 2016-08-28 (×2): qty 1

## 2016-08-28 SURGICAL SUPPLY — 17 items
BALLN EMERGE MR 2.0X15 (BALLOONS) ×2
BALLN ~~LOC~~ TREK RX 2.75X15 (BALLOONS) ×2
BALLOON EMERGE MR 2.0X15 (BALLOONS) ×1 IMPLANT
BALLOON ~~LOC~~ TREK RX 2.75X15 (BALLOONS) ×1 IMPLANT
CATH VISTA GUIDE 6FR JR4 (CATHETERS) ×2 IMPLANT
DEVICE CONTINUOUS FLUSH (MISCELLANEOUS) ×2 IMPLANT
KIT ENCORE 26 ADVANTAGE (KITS) ×2 IMPLANT
KIT HEART LEFT (KITS) ×2 IMPLANT
PACK CARDIAC CATHETERIZATION (CUSTOM PROCEDURE TRAY) ×2 IMPLANT
SHEATH PINNACLE 6F 10CM (SHEATH) ×2 IMPLANT
STENT SYNERGY DES 2.25X20 (Permanent Stent) ×2 IMPLANT
STENT SYNERGY DES 2.5X24 (Permanent Stent) ×2 IMPLANT
TRANSDUCER W/STOPCOCK (MISCELLANEOUS) ×2 IMPLANT
TUBING CIL FLEX 10 FLL-RA (TUBING) ×2 IMPLANT
WIRE ASAHI PROWATER 180CM (WIRE) ×2 IMPLANT
WIRE EMERALD 3MM-J .035X150CM (WIRE) ×2 IMPLANT
WIRE HI TORQ BMW 190CM (WIRE) ×2 IMPLANT

## 2016-08-28 NOTE — Progress Notes (Signed)
Per Eliezer Champagne, RN Dr. Claiborne Billings states will need a neuro consult to clear patient for anticoagulation with Brilinta prior to PCI today.

## 2016-08-28 NOTE — H&P (View-Only) (Signed)
Patient Name: [name] Date of Encounter: 08/28/2016  Active Problems:   Unstable angina [facility])  Patient Profile: [name] is a 75 y.o. male with PMH of CAD s/p Cypher DES to LAD Q000111Q, Chronic diastolic heart failure, OSA on CPAP followed by Dr. Halford Chessman Pulmonology, HTN, HLD, CVA and PAD s/p L CEA in 8/12. Was seen in the office on 08/21/16 and reported chest pain with more prominent T wave flattening in the lateral leads. Heart cath was recommended.   SUBJECTIVE: Feels well, denies chest pain and SOB.    OBJECTIVE Vitals:   08/27/16 1700 08/27/16 2029 08/27/16 2320 08/28/16 0511  BP: (!) 157/58 (!) 148/45  (!) 159/44  Pulse:  (!) 58 61 63  Resp: (!) 24 (!) 22 20 (!) 21  Temp:  97.7 F (36.5 C)  98 F (36.7 C)  TempSrc:  Oral  Oral  SpO2:  93% 94% 94%  Weight:    268 lb 15.4 oz (122 kg)  Height:        Intake/Output Summary (Last 24 hours) at 08/28/16 0631 Last data filed at 08/28/16 0300  Gross per 24 hour  Intake          2871.34 ml  Output                0 ml  Net          2871.34 ml   Filed Weights   08/27/16 0546 08/27/16 0900 08/28/16 0511  Weight: 265 lb (120.2 kg) 266 lb 12.1 oz (121 kg) 268 lb 15.4 oz (122 kg)    PHYSICAL EXAM General: Well developed, well nourished,obese male in no acute distress. Head: Normocephalic, atraumatic.  Neck: Supple without bruits, no JVD. Lungs:  Resp regular and unlabored, CTA. Heart: RRR, S1, S2, no S3, S4, or murmur; no rub. Abdomen: Soft, non-tender, non-distended, BS + x 4.  Extremities: No clubbing, cyanosis,trace pretibial edema.  Neuro: Alert and oriented X 3. Moves all extremities spontaneously. Psych: Normal affect.  LABS: CBC: Recent Labs  08/28/16 0523  WBC 8.8  HGB 11.5*  HCT 35.6*  MCV 88.1  PLT 0000000   Basic Metabolic Panel: Recent Labs  08/28/16 0523  NA 139  K 4.0  CL 109  CO2 23  GLUCOSE 140*  BUN 11  CREATININE 0.69  CALCIUM 8.4*   Left Heart Cath and Coronary  Angiography 08/27/16  Mid RCA lesion, 85 %stenosed.  Dist RCA lesion, 90 %stenosed.  3rd RPLB lesion, 85 %stenosed.  Mid LAD lesion, 20 %stenosed.   Significant multilevel lesion stenoses of 85% of the mid RCA, 90% in the region of the acute margin, and 85-90% distally.  Patent previously placed Cypher stent in the mid LAD with residual narrowing of 20%.  Normal ramus intermediate and left circumflex coronary arteries.  LVEDP 16 mm Hg.  The patient developed significant spasm during the radial procedure.  RECOMMENDATION: Plan will be to perform PCI of his RCA.  However, the patient has a history of prior Plavix allergy and  reportedly, there is a remote history of a very small previous hemorrhagic CVA, making him not a candidate for Effient and possibly not a candidate for Brilinta.  Ticlid which had been used prior to Plavix therapy is no longer available in the [place] market.  I will discuss the antiplatelet strategy with colleagues prior to final recommendations.      Current Facility-Administered Medications:  .  0.9 %  sodium chloride infusion,  250 mL, Intravenous, PRN, Troy Sine, MD .  0.9 %  sodium chloride infusion, , Intravenous, Continuous, Troy Sine, MD, Last Rate: 100 mL/hr at 08/28/16 0001 .  acetaminophen (TYLENOL) tablet 650 mg, 650 mg, Oral, Q4H PRN, Troy Sine, MD .  ALPRAZolam Duanne Moron) tablet 1 mg, 1 mg, Oral, BID PRN, Troy Sine, MD .  amLODipine (NORVASC) tablet 10 mg, 10 mg, Oral, Daily, [place] M Strader, [place] .  aspirin chewable tablet 81 mg, 81 mg, Oral, Daily, Troy Sine, MD .  atenolol (TENORMIN) tablet 100 mg, 100 mg, Oral, BID, Erma Heritage, [place], Stopped at 08/27/16 2328 .  atorvastatin (LIPITOR) tablet 40 mg, 40 mg, Oral, q1800, Erma Heritage, [place], 40 mg at 08/27/16 1725 .  diazepam (VALIUM) tablet 5 mg, 5 mg, Oral, Q6H PRN, Troy Sine, MD .  furosemide (LASIX) injection 20 mg, 20 mg, Intravenous, Once, Clayborne Dana,  MD .  insulin aspart (novoLOG) injection 0-15 Units, 0-15 Units, Subcutaneous, TID WC, Erma Heritage, [place], 3 Units at 08/27/16 1725 .  insulin aspart protamine- aspart (NOVOLOG MIX 70/30) injection 25 Units, 25 Units, Subcutaneous, Q supper, Fransisco Hertz [place], [place], 25 Units at 08/27/16 1727 .  lisinopril (PRINIVIL,ZESTRIL) tablet 40 mg, 40 mg, Oral, Daily, [place], [place] .  nitroGLYCERIN (NITROSTAT) SL tablet 0.4 mg, 0.4 mg, Sublingual, Q5 min PRN, Erma Heritage, PA .  ondansetron [facility]) injection 4 mg, 4 mg, Intravenous, Q6H PRN, Troy Sine, MD .  pantoprazole (PROTONIX) EC tablet 40 mg, 40 mg, Oral, Daily, Erma Heritage, [place], 40 mg at 08/27/16 1241 .  sodium chloride flush (NS) 0.9 % injection 3 mL, 3 mL, Intravenous, Q12H, Troy Sine, MD, 3 mL at 08/27/16 2300 .  sodium chloride flush (NS) 0.9 % injection 3 mL, 3 mL, Intravenous, PRN, Troy Sine, MD .  spironolactone (ALDACTONE) tablet 25 mg, 25 mg, Oral, Daily, [place] M Strader, [place] .  zolpidem (AMBIEN) tablet 5 mg, 5 mg, Oral, QHS PRN, Troy Sine, MD . sodium chloride 100 mL/hr at 08/28/16 0001    TELE:   Sinus brady     ECG: NSR    Current Medications:  . amLODipine  10 mg Oral Daily  . aspirin  81 mg Oral Daily  . atenolol  100 mg Oral BID  . atorvastatin  40 mg Oral q1800  . furosemide  20 mg Intravenous Once  . insulin aspart  0-15 Units Subcutaneous TID WC  . insulin aspart protamine- aspart  25 Units Subcutaneous Q supper  . lisinopril  40 mg Oral Daily  . pantoprazole  40 mg Oral Daily  . sodium chloride flush  3 mL Intravenous Q12H  . spironolactone  25 mg Oral Daily   . sodium chloride 100 mL/hr at 08/28/16 0001    ASSESSMENT AND PLAN: Active Problems:   Unstable angina ([place])  1. Unstable angina: Admitted for cath after reporting 3 episodes of chest pain at rest. He underwent left heart cath yesterday, 07/2916. Report above. He has a significant lesion in his RCA and needs  intervention but per report "the patient has a history of prior Plavix allergy and  reportedly, there is a remote history of a very small previous hemorrhagic CVA, making him not a candidate for Effient and possibly not a candidate for Brilinta.  Ticlid which had been used prior to Plavix therapy is no longer available in the [place] market.  I will  discuss the antiplatelet strategy with colleagues prior to final recommendations."  I spoke with Dr. Shon Hale (Neuro)  who said that it is safe for patient to be on Brilinta and ASA post PCI.  Patient has been chest pain free overnight. Right radial site is stable. Continue ASA and statin. No beta blocker due to bradycardia.   2. Chronic diastolic CHF: Last Echo was in Sept. 2016, EF was 55-60%. On ACE-I, spiro and furosemide. Not volume overloaded on exam.   3. PAD s/p L CEA in 8/12: Last seen by vascular surgery in March 2017, recommended one-year follow-up with repeat carotid Doppler  4. OSA on CPAP: followed by Dr. Halford Chessman Pulmonology: Has seen in the office in February 2017, appears to be stable.   Signed, Arbutus Leas , NP 6:31 AM 08/28/2016 Pager [phone]  Patient seen, examined. Available data reviewed. Agree with findings, assessment, and plan as outlined by Jettie Booze, NP. Exam reveals an obese, alert male in NAD. Lungs CTA, heart RRR, abdomen soft NT, extremities without edema. Femoral pulses 2+= bilaterally, right radial site clear.  Coronary angio reviewed and patient with severe RCA stenosis. Remote hx of small ICH reviewed, case has been discussed with neurology who has cleared him for PCI and DAPT. He is plavix allergic. Limited options for DAPT since Ticlid not available in [place] and effient contraindicated. Will load with ticagrelor 180 mg for PCI then 90 mg BID. Would favor Synergy DES with biodegradable polymer so that the duration of antiplatelet Rx might be minimized to 3 months post-PCI. He understands small risk of bleeding including  intracranial bleeding, but his event was remote and he has undergone prior PCI since that event without problems. Pt scheduled for PCI of the RCA next case with Dr Claiborne Billings via femoral approach because of severe radial vasospasm during his diagnostic study yesterday.  Sherren Mocha, M.D. 08/28/2016 10:29 AM

## 2016-08-28 NOTE — Care Management Note (Addendum)
Case Management Note  Patient Details  Name: [name] MRN: [id number] Date of Birth: [birth year]  Subjective/Objective:        Unstable angina, stent RCA             Action/Plan: Discharge Planning:  NCM spoke to pt at bedside. Pt started on Brilinta. Will check benefits for medication. Provided pt with Brilinta 30 day free trial card. States he goes to [facility] on [address] # [phone], they do have medication in stock.   S/W [facility] @ [employer] [facility] # [phone]   BRILINTA  90 MG BID ( 30)  60 TAB   COVER- YES  CO-PAY- $ 45.00  TIER- 3 DRUG  PRIOR APPROVAL -NO  PHARMACY: [facility]    08/29/2016 12:34 Updated pt on copay amount $45 and made him aware his [facility] had medication in stock.    Expected Discharge Date:  08/29/2016               Expected Discharge Plan:  Home/Self Care  In-House Referral:     Discharge planning Services  CM Consult, Medication Assistance  Post Acute Care Choice:  NA Choice offered to:     DME Arranged:  N/A DME Agency:  NA  HH Arranged:  NA HH Agency:  NA  Status of Service:  Completed, signed off  If discussed at [place] of Stay Meetings, dates discussed:    Additional Comments:  Erenest Rasher, RN 08/28/2016, 5:48 PM

## 2016-08-28 NOTE — Progress Notes (Signed)
Patient Name: [name] Date of Encounter: 08/28/2016  Active Problems:   Unstable angina [facility])  Patient Profile: [name] is a 75 y.o. male with PMH of CAD s/p Cypher DES to LAD [id number], Chronic diastolic heart failure, OSA on CPAP followed by Dr. Halford Chessman Pulmonology, HTN, HLD, CVA and PAD s/p L CEA in 8/12. Was seen in the office on 08/21/16 and reported chest pain with more prominent T wave flattening in the lateral leads. Heart cath was recommended.   SUBJECTIVE: Feels well, denies chest pain and SOB.    OBJECTIVE Vitals:   08/27/16 1700 08/27/16 2029 08/27/16 2320 08/28/16 0511  BP: (!) 157/58 (!) 148/45  (!) 159/44  Pulse:  (!) 58 61 63  Resp: (!) 24 (!) 22 20 (!) 21  Temp:  97.7 F (36.5 C)  98 F (36.7 C)  TempSrc:  Oral  Oral  SpO2:  93% 94% 94%  Weight:    268 lb 15.4 oz (122 kg)  Height:        Intake/Output Summary (Last 24 hours) at 08/28/16 0631 Last data filed at 08/28/16 0300  Gross per 24 hour  Intake          2871.34 ml  Output                0 ml  Net          2871.34 ml   Filed Weights   08/27/16 0546 08/27/16 0900 08/28/16 0511  Weight: 265 lb (120.2 kg) 266 lb 12.1 oz (121 kg) 268 lb 15.4 oz (122 kg)    PHYSICAL EXAM General: Well developed, well nourished,obese male in no acute distress. Head: Normocephalic, atraumatic.  Neck: Supple without bruits, no JVD. Lungs:  Resp regular and unlabored, CTA. Heart: RRR, S1, S2, no S3, S4, or murmur; no rub. Abdomen: Soft, non-tender, non-distended, BS + x 4.  Extremities: No clubbing, cyanosis,trace pretibial edema.  Neuro: Alert and oriented X 3. Moves all extremities spontaneously. Psych: Normal affect.  LABS: CBC: Recent Labs  08/28/16 0523  WBC 8.8  HGB 11.5*  HCT 35.6*  MCV 88.1  PLT 0000000   Basic Metabolic Panel: Recent Labs  08/28/16 0523  NA 139  K 4.0  CL 109  CO2 23  GLUCOSE 140*  BUN 11  CREATININE 0.69  CALCIUM 8.4*   Left Heart Cath and Coronary  Angiography 08/27/16  Mid RCA lesion, 85 %stenosed.  Dist RCA lesion, 90 %stenosed.  3rd RPLB lesion, 85 %stenosed.  Mid LAD lesion, 20 %stenosed.   Significant multilevel lesion stenoses of 85% of the mid RCA, 90% in the region of the acute margin, and 85-90% distally.  Patent previously placed Cypher stent in the mid LAD with residual narrowing of 20%.  Normal ramus intermediate and left circumflex coronary arteries.  LVEDP 16 mm Hg.  The patient developed significant spasm during the radial procedure.  RECOMMENDATION: Plan will be to perform PCI of his RCA.  However, the patient has a history of prior Plavix allergy and  reportedly, there is a remote history of a very small previous hemorrhagic CVA, making him not a candidate for Effient and possibly not a candidate for Brilinta.  Ticlid which had been used prior to Plavix therapy is no longer available in the [place] market.  I will discuss the antiplatelet strategy with colleagues prior to final recommendations.      Current Facility-Administered Medications:  .  0.9 %  sodium chloride infusion,  250 mL, Intravenous, PRN, Troy Sine, MD .  0.9 %  sodium chloride infusion, , Intravenous, Continuous, Troy Sine, MD, Last Rate: 100 mL/hr at 08/28/16 0001 .  acetaminophen (TYLENOL) tablet 650 mg, 650 mg, Oral, Q4H PRN, Troy Sine, MD .  ALPRAZolam Duanne Moron) tablet 1 mg, 1 mg, Oral, BID PRN, Troy Sine, MD .  amLODipine (NORVASC) tablet 10 mg, 10 mg, Oral, Daily, [place] M Strader, [place] .  aspirin chewable tablet 81 mg, 81 mg, Oral, Daily, Troy Sine, MD .  atenolol (TENORMIN) tablet 100 mg, 100 mg, Oral, BID, Erma Heritage, [place], Stopped at 08/27/16 2328 .  atorvastatin (LIPITOR) tablet 40 mg, 40 mg, Oral, q1800, Erma Heritage, [place], 40 mg at 08/27/16 1725 .  diazepam (VALIUM) tablet 5 mg, 5 mg, Oral, Q6H PRN, Troy Sine, MD .  furosemide (LASIX) injection 20 mg, 20 mg, Intravenous, Once, Clayborne Dana,  MD .  insulin aspart (novoLOG) injection 0-15 Units, 0-15 Units, Subcutaneous, TID WC, Erma Heritage, [place], 3 Units at 08/27/16 1725 .  insulin aspart protamine- aspart (NOVOLOG MIX 70/30) injection 25 Units, 25 Units, Subcutaneous, Q supper, Fransisco Hertz [place], [place], 25 Units at 08/27/16 1727 .  lisinopril (PRINIVIL,ZESTRIL) tablet 40 mg, 40 mg, Oral, Daily, [place], [place] .  nitroGLYCERIN (NITROSTAT) SL tablet 0.4 mg, 0.4 mg, Sublingual, Q5 min PRN, Erma Heritage, PA .  ondansetron [facility]) injection 4 mg, 4 mg, Intravenous, Q6H PRN, Troy Sine, MD .  pantoprazole (PROTONIX) EC tablet 40 mg, 40 mg, Oral, Daily, Erma Heritage, [place], 40 mg at 08/27/16 1241 .  sodium chloride flush (NS) 0.9 % injection 3 mL, 3 mL, Intravenous, Q12H, Troy Sine, MD, 3 mL at 08/27/16 2300 .  sodium chloride flush (NS) 0.9 % injection 3 mL, 3 mL, Intravenous, PRN, Troy Sine, MD .  spironolactone (ALDACTONE) tablet 25 mg, 25 mg, Oral, Daily, [place] M Strad[place], [place] .  zolpidem (AMBIEN) tablet 5 mg, 5 mg, Oral, QHS PRN, Troy Sine, MD . sodium chloride 100 mL/hr at 08/28/16 0001    TELE:   Sinus brady     ECG: NSR    Current Medications:  . amLODipine  10 mg Oral Daily  . aspirin  81 mg Oral Daily  . atenolol  100 mg Oral BID  . atorvastatin  40 mg Oral q1800  . furosemide  20 mg Intravenous Once  . insulin aspart  0-15 Units Subcutaneous TID WC  . insulin aspart protamine- aspart  25 Units Subcutaneous Q supper  . lisinopril  40 mg Oral Daily  . pantoprazole  40 mg Oral Daily  . sodium chloride flush  3 mL Intravenous Q12H  . spironolactone  25 mg Oral Daily   . sodium chloride 100 mL/hr at 08/28/16 0001    ASSESSMENT AND PLAN: Active Problems:   Unstable angina ([place])  1. Unstable angina: Admitted for cath after reporting 3 episodes of chest pain at rest. He underwent left heart cath yesterday, 07/2916. Report above. He has a significant lesion in his RCA and needs  intervention but per report "the patient has a history of prior Plavix allergy and  reportedly, there is a remote history of a very small previous hemorrhagic CVA, making him not a candidate for Effient and possibly not a candidate for Brilinta.  Ticlid which had been used prior to Plavix therapy is no longer available in the [place] market.  I will  discuss the antiplatelet strategy with colleagues prior to final recommendations."  I spoke with Dr. Shon Hale (Neuro)  who said that it is safe for patient to be on Brilinta and ASA post PCI.  Patient has been chest pain free overnight. Right radial site is stable. Continue ASA and statin. No beta blocker due to bradycardia.   2. Chronic diastolic CHF: Last Echo was in Sept. 2016, EF was 55-60%. On ACE-I, spiro and furosemide. Not volume overloaded on exam.   3. PAD s/p L CEA in 8/12: Last seen by vascular surgery in March 2017, recommended one-year follow-up with repeat carotid Doppler  4. OSA on CPAP: followed by Dr. Halford Chessman Pulmonology: Has seen in the office in February 2017, appears to be stable.   Signed, Arbutus Leas , NP 6:31 AM 08/28/2016 Pager [phone]  Patient seen, examined. Available data reviewed. Agree with findings, assessment, and plan as outlined by Jettie Booze, NP. Exam reveals an obese, alert male in NAD. Lungs CTA, heart RRR, abdomen soft NT, extremities without edema. Femoral pulses 2+= bilaterally, right radial site clear.  Coronary angio reviewed and patient with severe RCA stenosis. Remote hx of small ICH reviewed, case has been discussed with neurology who has cleared him for PCI and DAPT. He is plavix allergic. Limited options for DAPT since Ticlid not available in [place] and effient contraindicated. Will load with ticagrelor 180 mg for PCI then 90 mg BID. Would favor Synergy DES with biodegradable polymer so that the duration of antiplatelet Rx might be minimized to 3 months post-PCI. He understands small risk of bleeding including  intracranial bleeding, but his event was remote and he has undergone prior PCI since that event without problems. Pt scheduled for PCI of the RCA next case with Dr Claiborne Billings via femoral approach because of severe radial vasospasm during his diagnostic study yesterday.  Sherren Mocha, M.D. 08/28/2016 10:29 AM

## 2016-08-28 NOTE — Progress Notes (Signed)
Site area: right groin  Site Prior to Removal:  Level 0  Pressure Applied For 20 MINUTES    Minutes Beginning at 1820  Manual:   Yes.    Patient Status During Pull:  Stable   Post Pull Groin Site:  Level 0  Post Pull Instructions Given:  Yes.    Post Pull Pulses Present:  Yes.    Dressing Applied:  Yes.    Comments:  Bedrest started at [place], for 4 hours.

## 2016-08-28 NOTE — Interval H&P Note (Signed)
Cath Lab Visit (complete for each Cath Lab visit)  Clinical Evaluation Leading to the Procedure:   ACS: No.  Non-ACS:    Anginal Classification: CCS III  Anti-ischemic medical therapy: Maximal Therapy (2 or more classes of medications)  Non-Invasive Test Results: No non-invasive testing performed  Prior CABG: No previous CABG      History and Physical Interval Note:  08/28/2016 1:58 PM  [name]  has presented today for surgery, with the diagnosis of cad  The various methods of treatment have been discussed with the patient and family. After consideration of risks, benefits and other options for treatment, the patient has consented to  Procedure(s): Coronary Stent Intervention (N/A) as a surgical intervention .  The patient's history has been reviewed, patient examined, no change in status, stable for surgery.  I have reviewed the patient's chart and labs.  Questions were answered to the patient's satisfaction.     Shelva Majestic

## 2016-08-28 NOTE — Progress Notes (Signed)
Spoke to Dr. Claiborne Billings regarding patients lower extremity edema, IV fluids ordered and one time dose of 20 mg IV lasix ordered yesterday and not given. Will give IV lasix 20mg  one time dose this evening and infuse IV fluids as ordered to flush kidneys.

## 2016-08-28 NOTE — Progress Notes (Signed)
RT asked patient about the time that he wanted to put his CPAP on and patient stated that he just needed water put into the chamber and he could apply or family member would apply when he was ready.  RT will continue to monitor

## 2016-08-29 ENCOUNTER — Other Ambulatory Visit: Payer: Self-pay

## 2016-08-29 ENCOUNTER — Encounter (HOSPITAL_COMMUNITY): Payer: Self-pay | Admitting: Cardiovascular Disease

## 2016-08-29 LAB — CBC
HCT: 34.3 % — ABNORMAL LOW (ref 39.0–52.0)
Hemoglobin: 11.2 g/dL — ABNORMAL LOW (ref 13.0–17.0)
MCH: 28.2 pg (ref 26.0–34.0)
MCHC: 32.7 g/dL (ref 30.0–36.0)
MCV: 86.4 fL (ref 78.0–100.0)
Platelets: 215 10*3/uL (ref 150–400)
RBC: 3.97 MIL/uL — ABNORMAL LOW (ref 4.22–5.81)
RDW: 15.7 % — ABNORMAL HIGH (ref 11.5–15.5)
WBC: 10.1 10*3/uL (ref 4.0–10.5)

## 2016-08-29 LAB — GLUCOSE, CAPILLARY: Glucose-Capillary: 177 mg/dL — ABNORMAL HIGH (ref 65–99)

## 2016-08-29 LAB — BASIC METABOLIC PANEL
Anion gap: 7 (ref 5–15)
BUN: 8 mg/dL (ref 6–20)
CO2: 21 mmol/L — ABNORMAL LOW (ref 22–32)
Calcium: 8.5 mg/dL — ABNORMAL LOW (ref 8.9–10.3)
Chloride: 108 mmol/L (ref 101–111)
Creatinine, Ser: 0.79 mg/dL (ref 0.61–1.24)
GFR calc Af Amer: >60 mL/min (ref >60)
GFR calc non Af Amer: >60 mL/min (ref >60)
Glucose, Bld: 190 mg/dL — ABNORMAL HIGH (ref 65–99)
Potassium: 3.8 mmol/L (ref 3.5–5.1)
Sodium: 136 mmol/L (ref 135–145)

## 2016-08-29 MED ORDER — NITROGLYCERIN 0.4 MG SL SUBL
0.4000 mg | SUBLINGUAL_TABLET | SUBLINGUAL | 2 refills | Status: DC | PRN
Start: 2016-08-29 — End: 2021-05-04

## 2016-08-29 MED ORDER — ATORVASTATIN CALCIUM 80 MG PO TABS: 80.0000 mg | ORAL_TABLET | Freq: Every day | ORAL | 12 refills | Status: DC

## 2016-08-29 MED ORDER — ASPIRIN EC 81 MG PO TBEC: 81.0000 mg | DELAYED_RELEASE_TABLET | Freq: Every day | ORAL | Status: AC

## 2016-08-29 MED ORDER — PANTOPRAZOLE SODIUM 40 MG PO TBEC: 40.0000 mg | DELAYED_RELEASE_TABLET | Freq: Every day | ORAL | 12 refills | Status: AC

## 2016-08-29 MED ORDER — TICAGRELOR 90 MG PO TABS: 90.0000 mg | ORAL_TABLET | Freq: Two times a day (BID) | ORAL | 11 refills | Status: DC

## 2016-08-29 MED ORDER — TICAGRELOR 90 MG PO TABS: 90.0000 mg | ORAL_TABLET | Freq: Two times a day (BID) | ORAL | 0 refills | Status: DC

## 2016-08-29 NOTE — Patient Outreach (Signed)
[facility]) Care Management  08/29/2016  Jarrad Kilcullen [place] 27-Jul-1941 [id number]   Patient noted to be hospitalized.  Will notify Hospital Liaison. Will send physician a discipline closure letter.    Jone Baseman, RN, MSN [place] [phone]

## 2016-08-29 NOTE — Consult Note (Signed)
   [facility] CM Inpatient Consult   08/29/2016  [name] [birth year] [id number]  Patient is currently active with [place] [facility] for chronic disease management services.  Patient has been engaged by a [employer], Research scientist (medical) and CSW.  Our community based plan of care has focused on disease management and community resource support. [name] a 75 y.o.malewith PMH of CAD s/p Cypher DES to LAD 4/84, Chronic diastolic heart failure, OSA on CPAP followed by Dr. Halford [name] Pulmonology, HTN, HLD, CVA and PAD s/p L CEA in 8/12. Was seen in the office on 08/21/16 and reported chest pain with more prominent T wave flattening in the lateral leads. Heart cath was recommended per chart review. Patient has an active consent on file.  Met with the patient and son at the bedside.  Patient endorses ongoing follow up with [place] [facility].  He also endorses Jacob Peng, NP as his primary care provider.   Patient will receive a post discharge transition of care call and will be evaluated for monthly home visits for assessments and disease process education.  Made Inpatient Case Manager aware that [place] [facility] following. Of note, [facility] Care Management services does not replace or interfere with any services that are needed or arranged by inpatient case management or social work.  For additional questions or referrals please contact:  Jacob Brood, RN BSN [place] [facility] Liaison  [phone] business mobile phone Toll free office [phone]

## 2016-08-29 NOTE — Progress Notes (Signed)
Patient Name: [name] Date of Encounter: 08/29/2016  Active Problems:   Unstable angina [facility])   Primary Cardiologist: Dr. Stanford Breed Patient Profile:  Martina Sinner Schwitzerlettis a 75 y.o.malewith PMH of CAD s/p Cypher DES to LAD Q000111Q, Chronic diastolic heart failure, OSA on CPAP followed by Dr. Halford Chessman Pulmonology, HTN, HLD, CVA and PAD s/p L CEA in 8/12. Was seen in the office on 08/21/16 and reported chest pain with more prominent T wave flattening in the lateral leads. Heart cath was recommended, had DES x2 placed in RCA  SUBJECTIVE: Feels well, denies chest pain and SOB.   OBJECTIVE Vitals:   08/28/16 2000 08/28/16 2100 08/28/16 2200 08/29/16 0419  BP: (!) 143/47 (!) 138/50 (!) 143/47 (!) 150/41  Pulse: 60 (!) 53  64  Resp: 17 13 20 20   Temp: 98 F (36.7 C)   97.8 F (36.6 C)  TempSrc: Oral   Oral  SpO2: 97% 98%  96%  Weight:    265 lb 14 oz (120.6 kg)  Height:        Intake/Output Summary (Last 24 hours) at 08/29/16 0737 Last data filed at 08/28/16 2303  Gross per 24 hour  Intake          1652.67 ml  Output             4350 ml  Net         -2697.33 ml   Filed Weights   08/27/16 0900 08/28/16 0511 08/29/16 0419  Weight: 266 lb 12.1 oz (121 kg) 268 lb 15.4 oz (122 kg) 265 lb 14 oz (120.6 kg)    PHYSICAL EXAM General: Well developed, well nourished, male in no acute distress. Head: Normocephalic, atraumatic.  Neck: Supple without bruits, no JVD. Lungs:  Resp regular and unlabored, CTA. Heart: RRR, S1, S2, no S3, S4, or murmur; no rub. Abdomen: Soft, non-tender, non-distended, BS + x 4.  Extremities: No clubbing, cyanosis,no edema.  Neuro: Alert and oriented X 3. Moves all extremities spontaneously. Psych: Normal affect.  LABS: CBC: Recent Labs  08/28/16 0523 08/29/16 0602  WBC 8.8 10.1  HGB 11.5* 11.2*  HCT 35.6* 34.3*  MCV 88.1 86.4  PLT 188 [id number]   Basic Metabolic Panel: Recent Labs  08/28/16 0523 08/29/16 0602  NA 139 136  K 4.0  3.8  CL 109 108  CO2 23 21*  GLUCOSE 140* 190*  BUN 11 8  CREATININE 0.69 0.79  CALCIUM 8.4* 8.5*     Current Facility-Administered Medications:  .  0.9 %  sodium chloride infusion, 250 mL, Intravenous, PRN, Troy Sine, MD .  0.9 %  sodium chloride infusion, 250 mL, Intravenous, PRN, Troy Sine, MD .  0.9 %  sodium chloride infusion, , Intravenous, Continuous, Troy Sine, MD, Last Rate: 100 mL/hr at 08/29/16 [id number] .  acetaminophen (TYLENOL) tablet 650 mg, 650 mg, Oral, Q4H PRN, Troy Sine, MD .  ALPRAZolam Duanne Moron) tablet 1 mg, 1 mg, Oral, BID PRN, Troy Sine, MD .  amLODipine (NORVASC) tablet 10 mg, 10 mg, Oral, Daily, [place], [place], 10 mg at 08/28/16 1028 .  aspirin chewable tablet 81 mg, 81 mg, Oral, Daily, Troy Sine, MD, 81 mg at 08/28/16 0754 .  atenolol (TENORMIN) tablet 100 mg, 100 mg, Oral, BID, Fransisco Hertz [place], [place], 100 mg at 08/28/16 2238 .  atorvastatin (LIPITOR) tablet 80 mg, 80 mg, Oral, q1800, Troy Sine, MD, 80 mg at 08/28/16 2238 .  diazepam (VALIUM) tablet 5 mg, 5 mg, Oral, Q6H PRN, Troy Sine, MD .  insulin aspart (novoLOG) injection 0-15 Units, 0-15 Units, Subcutaneous, TID WC, Fransisco Hertz [place], [place], 3 Units at 08/29/16 [phone] .  insulin aspart protamine- aspart (NOVOLOG MIX 70/30) injection 25 Units, 25 Units, Subcutaneous, Q supper, Fransisco Hertz [place], [place], 25 Units at 08/27/16 1727 .  lisinopril (PRINIVIL,ZESTRIL) tablet 40 mg, 40 mg, Oral, Daily, Fransisco Hertz [place], [place], 40 mg at 08/28/16 1028 .  morphine 2 MG/ML injection 2 mg, 2 mg, Intravenous, Q2H PRN, Troy Sine, MD .  nitroGLYCERIN (NITROSTAT) SL tablet 0.4 mg, 0.4 mg, Sublingual, Q5 min PRN, Erma Heritage, PA .  ondansetron [facility]) injection 4 mg, 4 mg, Intravenous, Q6H PRN, Troy Sine, MD .  pantoprazole (PROTONIX) EC tablet 40 mg, 40 mg, Oral, Daily, Erma Heritage, [place], 40 mg at 08/28/16 1028 .  sodium chloride flush (NS) 0.9 % injection 3 mL, 3 mL,  Intravenous, Q12H, Troy Sine, MD, 3 mL at 08/28/16 2241 .  sodium chloride flush (NS) 0.9 % injection 3 mL, 3 mL, Intravenous, PRN, Troy Sine, MD .  sodium chloride flush (NS) 0.9 % injection 3 mL, 3 mL, Intravenous, Q12H, Troy Sine, MD, 3 mL at 08/28/16 2240 .  sodium chloride flush (NS) 0.9 % injection 3 mL, 3 mL, Intravenous, PRN, Troy Sine, MD .  spironolactone (ALDACTONE) tablet 25 mg, 25 mg, Oral, Daily, Erma Heritage, [place], 25 mg at 08/28/16 1028 .  ticagrelor (BRILINTA) tablet 90 mg, 90 mg, Oral, BID, Sherren Mocha, MD, 90 mg at 08/28/16 2237 .  zolpidem (AMBIEN) tablet 5 mg, 5 mg, Oral, QHS PRN, Troy Sine, MD . sodium chloride 100 mL/hr at 08/29/16 [id number]   TELE:   NSR     Coronary Stent Intervention 08/28/16  Dist RCA lesion, 90 %stenosed.  Mid LAD lesion, 20 %stenosed.  A STENT SYNERGY DES 2.25X20 drug eluting stent was successfully placed.  3rd RPLB lesion, 85 %stenosed.  Post intervention, there is a 0% residual stenosis.  A STENT SYNERGY DES 2.5X24 drug eluting stent was successfully placed.  Mid RCA lesion, 85 %stenosed.  Post intervention, there is a 0% residual stenosis.   Difficult but successful complex 3 lesion intervention involving an 85-90% very distal RCA stenosis jeopardizing a large posterolateral distal branch of the RCA, 90% stenosis at the acute margin and 85% eccentric mid RCA stenosis.  A buddy wire technique was necessary for placement of the stents with ultimate insertion of a 2.2520 mm Synergy DES stent in the distal RCA, and a 2.524 mm Synergy DES stent to cover both the mid RCA and acute margin stenoses, postdilated to 2.66 mm in this larger stent with all lesions being reduced to 0%.  There was brisk TIMI-3 flow.  There was no evidence for dissection.  RECOMMENDATION: With the utilization of the Synergy DES stents with biodegradable polymer the patient will be maintained on dual antiplatelet therapy for minimum of 3  months to perhaps 6 months if necessary in light of his previous remote CVA.      Current Medications:  . amLODipine  10 mg Oral Daily  . aspirin  81 mg Oral Daily  . atenolol  100 mg Oral BID  . atorvastatin  80 mg Oral q1800  . insulin aspart  0-15 Units Subcutaneous TID WC  . insulin aspart protamine- aspart  25 Units Subcutaneous Q supper  . lisinopril  40 mg  Oral Daily  . pantoprazole  40 mg Oral Daily  . sodium chloride flush  3 mL Intravenous Q12H  . sodium chloride flush  3 mL Intravenous Q12H  . spironolactone  25 mg Oral Daily  . ticagrelor  90 mg Oral BID   . sodium chloride 100 mL/hr at 08/29/16 [id number]    ASSESSMENT AND PLAN: Active Problems:   Unstable angina ([place])  1. CAD s/p DESx2 to RCA: Synergy DES used so the need for DAPT can be minimized to 3 months with ASA and Brilinta in light of this history of CVA. Continue high intensity statin and beta blocker.   2. Chronic diastolic CHF: Last Echo was in Sept. 2016, EF was 55-60%. On ACE-I, spiro and furosemide. Not volume overloaded on exam.   3. PAD s/p L CEA in 8/12: Last seen by vascular surgery in March 2017, recommended one-year follow-up with repeat carotid Doppler  4. OSA on CPAP: followed by Dr. Halford Chessman Pulmonology: Has seen in the office in February 2017, appears to be stable.  Signed, Arbutus Leas , NP 7:37 AM 08/29/2016 Pager [phone]  Patient seen, examined. Available data reviewed. Agree with findings, assessment, and plan as outlined by Jettie Booze, NP. Exam reveals an alert oriented male in no distress. Lungs are clear to auscultation bilaterally, heart is regular rate and rhythm without murmur or gallop, the right groin is clear without evidence of hematoma. There is mild ecchymoses noted. There is no peripheral edema. Reviewed cardiac catheterization/PCI findings and results. The patient is tolerating aspirin and brilinta. Will arrange follow-up with Dr. Stanford Breed or his PA/NP. Patient is ready for  discharge.  Sherren Mocha, M.D. 08/29/2016 11:56 AM

## 2016-08-29 NOTE — Discharge Summary (Signed)
Discharge Summary    Patient ID: [name],  MRN: [id number], DOB/AGE: [birth year] 75 y.o.  Admit date: 08/27/2016 Discharge date: 08/29/2016  Primary Care Provider: Dorothyann Peng Primary Cardiologist: Dr. Stanford Breed  Discharge Diagnoses    Active Problems:   Unstable angina ([place])   Allergies Allergies  Allergen Reactions  . Clopidogrel Bisulfate Itching and Rash    REACTION: rash, itching from Head to Toe   -  PLAVIX   . Adhesive [Tape] Rash    rash    Diagnostic Studies/Procedures  Left Heart Cath and Coronary Angiography 08/27/16   Mid RCA lesion, 85 %stenosed.  Dist RCA lesion, 90 %stenosed.  3rd RPLB lesion, 85 %stenosed.  Mid LAD lesion, 20 %stenosed.   Significant multilevel lesion stenoses of 85% of the mid RCA, 90% in the region of the acute margin, and 85-90% distally.  Patent previously placed Cypher stent in the mid LAD with residual narrowing of 20%.  Normal ramus intermediate and left circumflex coronary arteries.  LVEDP 16 mm Hg.  The patient developed significant spasm during the radial procedure.  RECOMMENDATION: Plan will be to perform PCI of his RCA.  However, the patient has a history of prior Plavix allergy and  reportedly, there is a remote history of a very small previous hemorrhagic CVA, making him not a candidate for Effient and possibly not a candidate for Brilinta.  Ticlid which had been used prior to Plavix therapy is no longer available in the [place] market.  I will discuss the antiplatelet strategy with colleagues prior to final recommendations.  Coronary Stent Intervention 08/28/16  Dist RCA lesion, 90 %stenosed.  Mid LAD lesion, 20 %stenosed.  A STENT SYNERGY DES 2.25X20 drug eluting stent was successfully placed.  3rd RPLB lesion, 85 %stenosed.  Post intervention, there is a 0% residual stenosis.  A STENT SYNERGY DES 2.5X24 drug eluting stent was successfully placed.  Mid RCA lesion, 85 %stenosed.  Post  intervention, there is a 0% residual stenosis.   Difficult but successful complex 3 lesion intervention involving an 85-90% very distal RCA stenosis jeopardizing a large posterolateral distal branch of the RCA, 90% stenosis at the acute margin and 85% eccentric mid RCA stenosis.  A buddy wire technique was necessary for placement of the stents with ultimate insertion of a 2.2520 mm Synergy DES stent in the distal RCA, and a 2.524 mm Synergy DES stent to cover both the mid RCA and acute margin stenoses, postdilated to 2.66 mm in this larger stent with all lesions being reduced to 0%.  There was brisk TIMI-3 flow.  There was no evidence for dissection.  RECOMMENDATION: With the utilization of the Synergy DES stents with biodegradable polymer the patient will be maintained on dual antiplatelet therapy for minimum of 3 months to perhaps 6 months if necessary in light of his previous remote CVA.   _____________   History of Present Illness   [name] is a 75 y.o. male with PMH of CAD s/p Cypher DES to LAD [id number], Chronic diastolic heart failure, OSA on CPAP followed by Dr. Halford [facility]ssman Pulmonology, HTN, HLD, CVA and PAD s/p L CEA in 8/12. He had an ETT Myoview on 02/04/2015, he did have chest pain during the Myoview at peak exercise with EKG changes post exercise including downsloping ST segment in the inferolateral leads, however image was normal with no evidence of ischemia, EF 59%. He also has chronic edema and history of CVA.  Echocardiogram obtained in September 2016 showed  normal LV function, mild LAE. Patient was last seen in cardiology clinic by Dr. Stanford Breed on 01/04/2016, at which time he still have some dyspnea on exertion however denied any orthopnea, PND or palpitations. It was felt his dyspnea is likely multifactorial including obesity, deconditioning, obstructive sleep apnea and chronic diastolic congestive heart failure.  He did undergo a repeat outpatient Myoview on 01/16/2016 which  showed EF 54%, no significant ST changes, small mild, fixed apical and inferior basal defect consistent with thinning, and no ischemia was noted, overall low risk study.   He presented to the office on 08/21/16 for a 6 month follow up. He reported that he had 3 episodes of chest pain the week prior which all occurred at rest. He says he never had chest pain with previous angina, has always been worsening dyspnea on exertion. He does have chronic dyspnea on exertion at baseline which he says is only slightly worse in the last year. The worst episode of chest pain was last Saturday, and it lasted roughly 25 minutes and resolved only after 3 nitroglycerin.  He was admitted for heart cath.   Hospital Course  Left heart cath on 08/27/16 showed 85% stenosis in the mid RCA and 90% stenosis mid RCA. He needed PCI on his RCA, however had a history of small hemorrhagic CVA, making him not a candidate for Effient. He has a allergy to Plavix and Ticlid is no longer available in the [place].   Neurology was consulted to ask if it was safe for him to be on Brilinta and ASA following PCI. Neurology felt that it was safe, so PCI procedure was planned.   He has successful placement of DES x 2 to his RCA. Synergy DES with biodegradable polymer so that dual anti-platelet therapy can be minimized to 3 months in light of his history of CVA.   His right groin and right radial sites were stable without hematoma. Will continue his ACE-I, spironolactone and furosemide. He will continue high intensity statin and beta blocker.   He was seen today by Dr. Burt Knack and deemed suitable for discharge.  _____________  Discharge Vitals Blood pressure (!) 138/42, pulse (!) 58, temperature 97.5 F (36.4 C), temperature source Oral, resp. rate 18, height 5\' 9"  (1.753 m), weight 265 lb 14 oz (120.6 kg), SpO2 97 %.  Filed Weights   08/27/16 0900 08/28/16 0511 08/29/16 0419  Weight: 266 lb 12.1 oz (121 kg) 268 lb 15.4 oz (122 kg) 265 lb 14  oz (120.6 kg)    Labs & Radiologic Studies     CBC  Recent Labs  08/28/16 0523 08/29/16 0602  WBC 8.8 10.1  HGB 11.5* 11.2*  HCT 35.6* 34.3*  MCV 88.1 86.4  PLT 188 123456   Basic Metabolic Panel  Recent Labs  08/28/16 0523 08/29/16 0602  NA 139 136  K 4.0 3.8  CL 109 108  CO2 23 21*  GLUCOSE 140* 190*  BUN 11 8  CREATININE 0.69 0.79  CALCIUM 8.4* 8.5*    Disposition   Pt is being discharged home today in good condition.  Follow-up Plans & Appointments    Follow-up Information    Kirk Ruths, MD Follow up on 09/03/2016.   Specialty:  Cardiology Why:  at 9:15am for hospital follow up.  Contact information: [address] [place] [address] [phone]          Discharge Instructions    AMB Referral to [facility] Management    Complete by:  As directed   Reason for consult:  Post hospital follow up post procedure cardiac issues   Expected date of contact:  1-3 days (reserved for hospital discharges)   Please assign to community nurse for transition of care calls and assess for home visits. Patient was previously active with [facility] prior to admission.  Questions please call:   Natividad Brood, RN BSN [facility] Liaison  [phone] business mobile phone Toll free office [phone]   Amb Referral to Cardiac Rehabilitation    Complete by:  As directed   Diagnosis:   PTCA Coronary Stents     Diet - low sodium heart healthy    Complete by:  As directed   Discharge instructions    Complete by:  As directed   NO HEAVY LIFTING OR SEXUAL ACTIVITY for 7 DAYS. NO DRIVING for 3-5 DAYS. NO SOAKING BATHS, HOT TUBS, POOLS, ETC., for 7 DAYS  Radial Site Care Refer to this sheet in the next few weeks. These instructions provide you with information on caring for yourself after your procedure. Your caregiver may also give you more specific instructions. Your treatment has been planned according to current medical  practices, but problems sometimes occur. Call your caregiver if you have any problems or questions after your procedure. HOME CARE INSTRUCTIONS You may shower the day after the procedure.Remove the bandage (dressing) and gently wash the site with plain soap and water.Gently pat the site dry.  Do not apply powder or lotion to the site.  Do not submerge the affected site in water for 3 to 5 days.  Inspect the site at least twice daily.  Do not flex or bend the affected arm for 24 hours.  No lifting over 5 pounds (2.3 kg) for 5 days after your procedure.  Do not drive home if you are discharged the same day of the procedure. Have someone else drive you.  You may drive 24 hours after the procedure unless otherwise instructed by your caregiver.  What to expect: Any bruising will usually fade within 1 to 2 weeks.  Blood that collects in the tissue (hematoma) may be painful to the touch. It should usually decrease in size and tenderness within 1 to 2 weeks.  SEEK IMMEDIATE MEDICAL CARE IF: You have unusual pain at the radial site.  You have redness, warmth, swelling, or pain at the radial site.  You have drainage (other than a small amount of blood on the dressing).  You have chills.  You have a fever or persistent symptoms for more than 72 hours.  You have a fever and your symptoms suddenly get worse.  Your arm becomes pale, cool, tingly, or numb.  You have heavy bleeding from the site. Hold pressure on the site.   Increase activity slowly    Complete by:  As directed      Discharge Medications   Discharge Medication List as of 08/29/2016  2:05 PM    START taking these medications   Details  aspirin EC 81 MG tablet Take 1 tablet (81 mg total) by mouth daily., Starting Thu 08/29/2016, OTC    pantoprazole (PROTONIX) 40 MG tablet Take 1 tablet (40 mg total) by mouth daily., Starting Thu 08/29/2016, Normal    !! ticagrelor (BRILINTA) 90 MG TABS tablet Take 1 tablet (90 mg total) by mouth 2  (two) times daily., Starting Thu 08/29/2016, Print    !! ticagrelor (BRILINTA) 90 MG TABS tablet Take 1 tablet (90 mg total) by  mouth 2 (two) times daily., Starting Thu 08/29/2016, Normal     !! - Potential duplicate medications found. Please discuss with provider.    CONTINUE these medications which have CHANGED   Details  atorvastatin (LIPITOR) 80 MG tablet Take 1 tablet (80 mg total) by mouth daily at 6 PM., Starting Thu 08/29/2016, Normal    nitroGLYCERIN (NITROSTAT) 0.4 MG SL tablet Place 1 tablet (0.4 mg total) under the tongue every 5 (five) minutes as needed for chest pain., Starting Thu 08/29/2016, Normal      CONTINUE these medications which have NOT CHANGED   Details  amLODipine (NORVASC) 10 MG tablet Take 1 tablet (10 mg total) by mouth daily., Starting Wed 12/20/2015, Normal    atenolol (TENORMIN) 100 MG tablet Take 1 tablet (100 mg total) by mouth 2 (two) times daily., Starting Wed 12/20/2015, Normal    !! furosemide (LASIX) 20 MG tablet Take 1 tablet (20 mg total) by mouth daily., Starting Wed 12/20/2015, Normal    !! furosemide (LASIX) 40 MG tablet Take 1 tablet (40 mg total) by mouth daily., Starting Wed 12/20/2015, No Print    glipiZIDE (GLUCOTROL) 10 MG tablet TAKE 1 TABLET (10 MG TOTAL) BY MOUTH 2 (TWO) TIMES DAILY BEFORE A MEAL., Normal    insulin lispro protamine-lispro (HUMALOG 75/25 MIX) (75-25) 100 UNIT/ML SUSP injection Inject 25 Units into the skin daily with supper. , Historical Med    lisinopril (PRINIVIL,ZESTRIL) 40 MG tablet Take 1 tablet (40 mg total) by mouth daily., Starting Wed 12/20/2015, Normal    metFORMIN (GLUCOPHAGE) 1000 MG tablet TAKE 1 TABLET BY MOUTH TWICE DAILY WITH MEALS, Normal    spironolactone (ALDACTONE) 25 MG tablet Take 1 tablet (25 mg total) by mouth daily., Starting Wed 12/20/2015, Normal     !! - Potential duplicate medications found. Please discuss with provider.    STOP taking these medications     aspirin 325 MG tablet       omeprazole (PRILOSEC) 20 MG capsule            Outstanding Labs/Studies     Duration of Discharge Encounter   Greater than 30 minutes including physician time.  Signed, Arbutus Leas NP 08/29/2016, 2:22 PM

## 2016-08-29 NOTE — Progress Notes (Signed)
CARDIAC REHAB PHASE I   PRE:  Rate/Rhythm: 70 SR    BP: sitting 148/43    SaO2:   MODE:  Ambulation: 500 ft   POST:  Rate/Rhythm: 95 SR with PVCs    BP: sitting 167/50     SaO2:   Pt ambulated long distance for him. Rest x2 due to his back issues and also SOB. Pt sts he was hoping SOB would be gone after procedure, although he sts it is some improved. ? If somewhat r/t deconditioning. Encouraged ex on his stationary bike and to continue back exercises. He is interested in [place] again and will send referral to [place]. Understands importance of Brilinta/ASA. All ed done. [place], ACSM 08/29/2016 9:19 AM

## 2016-08-30 ENCOUNTER — Other Ambulatory Visit: Payer: Self-pay | Admitting: *Deleted

## 2016-08-30 ENCOUNTER — Encounter: Payer: Self-pay | Admitting: Cardiology

## 2016-08-30 NOTE — Patient Outreach (Signed)
[facility]) Care Management  08/30/2016  [name] Sep 19, 1941 [id number]   Referral received from hospital liaison requesting involvement for transition of care program as member was recently discharged.  According to chart, member was seen at his cardiology office as a routine visit, expressed concern for chest pain.  He was admitted on 8/29 for elective cardiac cath where he received coronary stents.  Discharged 8/31.  Member was recently involved with [facility] case management for diabetes management, referred to health coach on 8/16.  Call placed to member, identity verified.  He state that he has been doing well since his discharge yesterday.  He report he has sat down to review his list of medications, removing the ones that were discontinued.  Patient was recently discharged from hospital and all medications have been reviewed.  He denies any questions concerning medications.  Member educated on complications of cardiac cath, discharge instructions reviewed.  He denies any complications at this time.  Follow up appointment with cardiologist confirmed for Tuesday morning.  He denies the need for a home visit at this time, but does agree to follow up call next week.  Advised to contact this care manager with questions/concerns.  Valente David, [place], MSN [place] [phone]

## 2016-08-31 NOTE — Progress Notes (Signed)
HPI: FU CAD; s/p Cypher DES to the LAD 7/08. Also with chronic edema and cerebrovascular disease (followed by vascular surgery). Abd ultrasound 5/16 showed no aneurysm. Echo 9/16 showed normal LV function, mild LAE. Cath 8/17 showed 85 mid RCA, 90 distal RCA, 85 RPLB. Pt had 2 DES. Since last seen, the patient has dyspnea with more extreme activities but not with routine activities. It is relieved with rest. It is not associated with chest pain. There is no orthopnea, PND or pedal edema. There is no syncope or palpitations. There is no exertional chest pain.   Current Outpatient Prescriptions  Medication Sig Dispense Refill  . amLODipine (NORVASC) 10 MG tablet Take 1 tablet (10 mg total) by mouth daily. 90 tablet 3  . aspirin EC 81 MG tablet Take 1 tablet (81 mg total) by mouth daily.    [name] atenolol (TENORMIN) 100 MG tablet Take 1 tablet (100 mg total) by mouth 2 (two) times daily. 200 tablet 1  . atorvastatin (LIPITOR) 80 MG tablet Take 1 tablet (80 mg total) by mouth daily at 6 PM. 30 tablet 12  . furosemide (LASIX) 20 MG tablet Take 1 tablet (20 mg total) by mouth daily. 90 tablet 3  . furosemide (LASIX) 40 MG tablet Take 1 tablet (40 mg total) by mouth daily. 90 tablet 3  . glipiZIDE (GLUCOTROL) 10 MG tablet TAKE 1 TABLET (10 MG TOTAL) BY MOUTH 2 (TWO) TIMES DAILY BEFORE A MEAL. 200 tablet 3  . insulin lispro protamine-lispro (HUMALOG 75/25 MIX) (75-25) 100 UNIT/ML SUSP injection Inject 25 Units into the skin daily with supper.     [name] lisinopril (PRINIVIL,ZESTRIL) 40 MG tablet Take 1 tablet (40 mg total) by mouth daily. 100 tablet 3  . metFORMIN (GLUCOPHAGE) 1000 MG tablet TAKE 1 TABLET BY MOUTH TWICE DAILY WITH MEALS 200 tablet 0  . nitroGLYCERIN (NITROSTAT) 0.4 MG SL tablet Place 1 tablet (0.4 mg total) under the tongue every 5 (five) minutes as needed for chest pain. 25 tablet 2  . pantoprazole (PROTONIX) 40 MG tablet Take 1 tablet (40 mg total) by mouth daily. 30 tablet 12  .  spironolactone (ALDACTONE) 25 MG tablet Take 1 tablet (25 mg total) by mouth daily. 90 tablet 3  . ticagrelor (BRILINTA) 90 MG TABS tablet Take 1 tablet (90 mg total) by mouth 2 (two) times daily. 60 tablet 11   No current facility-administered medications for this visit.      Past Medical History:  Diagnosis Date  . Acute diastolic congestive heart failure ([place]) 09/20/2015  . Arthritis    "fingers" (08/27/2016)  . CAD (coronary artery disease)    a. 06/2007 PCI LAD->Cypher DES;  b. 02/2010 Cath LM 30-40, LAD 40-59m, patent stent, D2 50, RI 70 ost, LCX 50, OM 30, RCA 30-36m, 30d, PDA 50, RPL 40-50p, nl EF.  . Carotid artery occlusion    a. 07/2011 L CEA;  b. 02/2012 Carotid U/S: RICA [id number], LICA patent.  . Chronic back pain    "moves up and down my back" (08/27/2016)  . Chronic bronchitis ([place])   . CVA (cerebral vascular accident) ([place]) 2001, 2004   "I've only had 1; hemorrhagic; don't remember specifically when" (08/27/2016)  . GERD (gastroesophageal reflux disease)   . Heart murmur   . History of blood transfusion    "related to a surgery I think"  . Hyperlipidemia   . Hypertension   . Kidney stones   . OSA on CPAP 07/20/2015  .  Peripheral vascular disease ([place]) 2001  . Pneumonia    "a number of times" (08/27/2016)  . Type II diabetes mellitus ([place])     Past Surgical History:  Procedure Laterality Date  . CARDIAC CATHETERIZATION  02/2010; 08/27/2016   "no stent"/notes 03/28/2010; unable to put stent in cause of spasms"  . CARDIAC CATHETERIZATION N/A 08/27/2016   Procedure: Left Heart Cath and Coronary Angiography;  Surgeon: Troy Sine, MD;  Location: [facility];  Service: Cardiovascular;  Laterality: N/A;  . CARDIAC CATHETERIZATION N/A 08/28/2016   Procedure: Coronary Stent Intervention;  Surgeon: Troy Sine, MD;  Location: [facility];  Service: Cardiovascular;  Laterality: N/A;  . CAROTID ENDARTERECTOMY Left 08/23/2011  . CORONARY ANGIOPLASTY WITH STENT  PLACEMENT  06-30-07  . CYSTOSCOPY W/ STONE MANIPULATION    . INGUINAL HERNIA REPAIR Right 1962  . [place]   "cut me open"    Social History   Social History  . Marital status: Married    Spouse name: N/A  . Number of children: N/A  . Years of education: N/A   Occupational History  . Medical illustrator    Social History Main Topics  . Smoking status: Never Smoker  . Smokeless tobacco: Never Used  . Alcohol use No  . Drug use: No  . Sexual activity: Not Currently   Other Topics Concern  . Not on file   Social History Narrative   Works part time as an Medical illustrator.   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    One son who lives in [place] - son is adopted.    She takes care of his wife, she is oxygen continuously.     Family History  Problem Relation Age of Onset  . Heart disease Mother     CHF Heart Disease before age 59  . Heart disease Father     Heart Disease before age 60  . Cancer Brother     Lung  . Cancer Brother     Lung  . COPD Brother     ROS: no fevers or chills, productive cough, hemoptysis, dysphasia, odynophagia, melena, hematochezia, dysuria, hematuria, rash, seizure activity, orthopnea, PND, pedal edema, claudication. Remaining systems are negative.  Physical Exam: Well-developed obese in no acute distress.  Skin is warm and dry.  HEENT is normal.  Neck is supple.  Chest is clear to auscultation with normal expansion.  Cardiovascular exam is regular rate and rhythm.  Abdominal exam nontender or distended. No masses palpated. Extremities show no edema. neuro grossly intact   AP  1 Coronary artery disease-continue aspirin and statin.  2. Cerebrovascular disease-continue aspirin and statin. Followed by vascular surgery.  3 hypertension-blood pressure controlled. Continue present medications.  4 hyperlipidemia-continue statin. Lipitor dose was recently increased. Check lipids and liver in 4 weeks.  5 chronic diastolic congestive heart  failure-continue present dose of diuretics.  Kirk Ruths, MD

## 2016-09-03 ENCOUNTER — Ambulatory Visit (INDEPENDENT_AMBULATORY_CARE_PROVIDER_SITE_OTHER): Payer: Medicare Other | Admitting: Cardiology

## 2016-09-03 ENCOUNTER — Encounter: Payer: Self-pay | Admitting: Cardiology

## 2016-09-03 VITALS — BP 134/60 | HR 67 | Ht 69.5 in | Wt 253.6 lb

## 2016-09-03 DIAGNOSIS — I5032 Chronic diastolic (congestive) heart failure: Secondary | ICD-10-CM | POA: Diagnosis not present

## 2016-09-03 DIAGNOSIS — I10 Essential (primary) hypertension: Secondary | ICD-10-CM | POA: Diagnosis not present

## 2016-09-03 DIAGNOSIS — I6523 Occlusion and stenosis of bilateral carotid arteries: Secondary | ICD-10-CM | POA: Diagnosis not present

## 2016-09-03 DIAGNOSIS — E785 Hyperlipidemia, unspecified: Secondary | ICD-10-CM

## 2016-09-03 DIAGNOSIS — I2510 Atherosclerotic heart disease of native coronary artery without angina pectoris: Secondary | ICD-10-CM | POA: Diagnosis not present

## 2016-09-03 NOTE — Patient Instructions (Signed)
NO CHANGES IN CURRENT MEDICATIONS   LABS IN 4 WEEKS ( OCT 2017) ----LIPID ,HEPATIC PANEL NOTHING TO EAT THE MORNING OF THE TEST   Your physician wants you to follow-up in: [place]. You will receive a reminder letter in the mail two months in advance. If you don't receive a letter, please call our office to schedule the follow-up appointment.   If you need a refill on your cardiac medications before your next appointment, please call your pharmacy.

## 2016-09-05 ENCOUNTER — Other Ambulatory Visit: Payer: Self-pay | Admitting: *Deleted

## 2016-09-05 NOTE — Patient Outreach (Signed)
[facility]) Care Management  09/05/2016  [name] 1941/06/22 [id number]   Call placed to member to follow up on appointment with cardiologist.  He state that his visit went "good" with no concerns at this time.  He report that he has to follow up in 4 weeks for labs, but no further follow up needed for 6 months.  He denies having any chest pain.  He reports that he has ran out of his samples for the insulin provided at his PCP office, but has since restarted his Humalog 75/25, 25 units daily.  He state his blood sugars have dropped below 100s on a consistent basis.  He denies scheduling an appointment with his PCP as of yet, advised to schedule appointment to review diabetes management.   He verbalizes understanding.    Member at this time state that he has no further concerns/need for community care management involvement, but remains open to referral back to health coach for diabetes management as this hospitalization was for elective cardiac cath.  Referral placed back to health coach.  Valente David, [place], MSN [place] [phone]

## 2016-09-09 ENCOUNTER — Other Ambulatory Visit: Payer: Self-pay

## 2016-09-09 NOTE — Patient Outreach (Signed)
[facility]) Care Management  09/09/2016  [name] 1941-02-15 [id number]   Telephone call to patient for introductory call.  Explained patient health coach role.  Patient receptive to call.    Plan: RN Health Coach will contact patient in the month of October and patient agrees to next outreach.   Jone Baseman, RN, MSN [place] [phone]

## 2016-09-12 IMAGING — NM NM MISC PROCEDURE
6 series · 36 of 36 positions shown · non-contrast
Comparison: none

[Series 1: wbr_r-proj_st wbr rest · 6.40mm/px · 6 of 64 frames shown]
[frame 6/64]
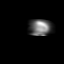
[frame 16/64]
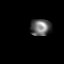
[frame 27/64]
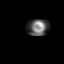
[frame 38/64]
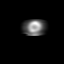
[frame 48/64]
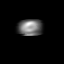
[frame 59/64]
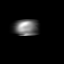

[Series 1: wbr rest · 6.40mm/px · 6 of 62 frames shown]
[frame 6/62]
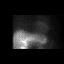
[frame 16/62]
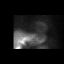
[frame 26/62]
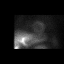
[frame 37/62]
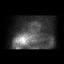
[frame 47/62]
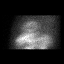
[frame 57/62]
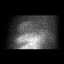

[Series 2: wbr_s-proj_st wbr stress-gsp · 6.40mm/px · 6 of 512 frames shown]
[frame 43/512]
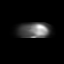
[frame 128/512]
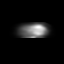
[frame 214/512]
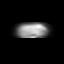
[frame 299/512]
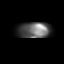
[frame 384/512]
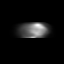
[frame 470/512]
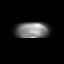

[Series 2: wbr stress-gsp · 6.40mm/px · 6 of 476 frames shown]
[frame 40/476  full-range]
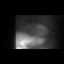
[frame 119/476  full-range]
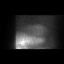
[frame 199/476  full-range]
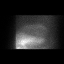
[frame 278/476  full-range]
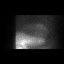
[frame 357/476  full-range]
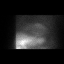
[frame 437/476  full-range]
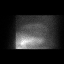

[Series 3: wbr_s-proj_st wbr stress-sum-em · 6.40mm/px · 6 of 64 frames shown]
[frame 6/64]
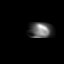
[frame 16/64]
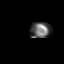
[frame 27/64]
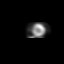
[frame 38/64]
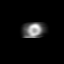
[frame 48/64]
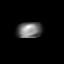
[frame 59/64]
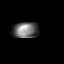

[Series 3: wbr stress-sum-em · 6.40mm/px · 6 of 64 frames shown]
[frame 6/64]
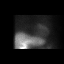
[frame 16/64]
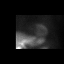
[frame 27/64]
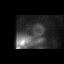
[frame 38/64]
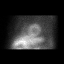
[frame 48/64]
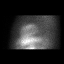
[frame 59/64]
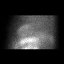

[36 of 36 positions shown; findings below may reference images not displayed]

Canned report from images found in remote index.

Refer to host system for actual result text.

## 2016-09-13 ENCOUNTER — Ambulatory Visit (INDEPENDENT_AMBULATORY_CARE_PROVIDER_SITE_OTHER): Payer: Medicare Other | Admitting: Family Medicine

## 2016-09-13 ENCOUNTER — Encounter: Payer: Self-pay | Admitting: Family Medicine

## 2016-09-13 VITALS — BP 128/60 | HR 75 | Temp 97.5°F | Ht 69.5 in | Wt 257.3 lb

## 2016-09-13 DIAGNOSIS — L989 Disorder of the skin and subcutaneous tissue, unspecified: Secondary | ICD-10-CM | POA: Diagnosis not present

## 2016-09-13 DIAGNOSIS — I6523 Occlusion and stenosis of bilateral carotid arteries: Secondary | ICD-10-CM
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
## 2016-09-13 NOTE — Patient Instructions (Signed)
Antibiotic ointment twice daily   Keep an eye on this area  Seek care if worsening, spreading redness, streaking, drainage or other concerns

## 2016-09-13 NOTE — Progress Notes (Signed)
Pre visit review using our clinic review tool, if applicable. No additional management support is needed unless otherwise documented below in the visit note. 

## 2016-09-17 ENCOUNTER — Ambulatory Visit: Payer: Medicare Other

## 2016-09-24 ENCOUNTER — Other Ambulatory Visit: Payer: Self-pay | Admitting: Adult Health

## 2016-09-24 NOTE — Telephone Encounter (Signed)
Ok to refill 

## 2016-09-25 NOTE — Telephone Encounter (Signed)
Ok to refill for one year  

## 2016-09-27 IMAGING — DX DG CHEST 2V
2 series · 2 of 2 positions shown · non-contrast
Comparison: Chest x-ray of 09/26/2015

CLINICAL DATA: Cough, congestion, shortness of breath for 4 days,
diabetes

EXAM:
CHEST  2 VIEW

[chest pa]
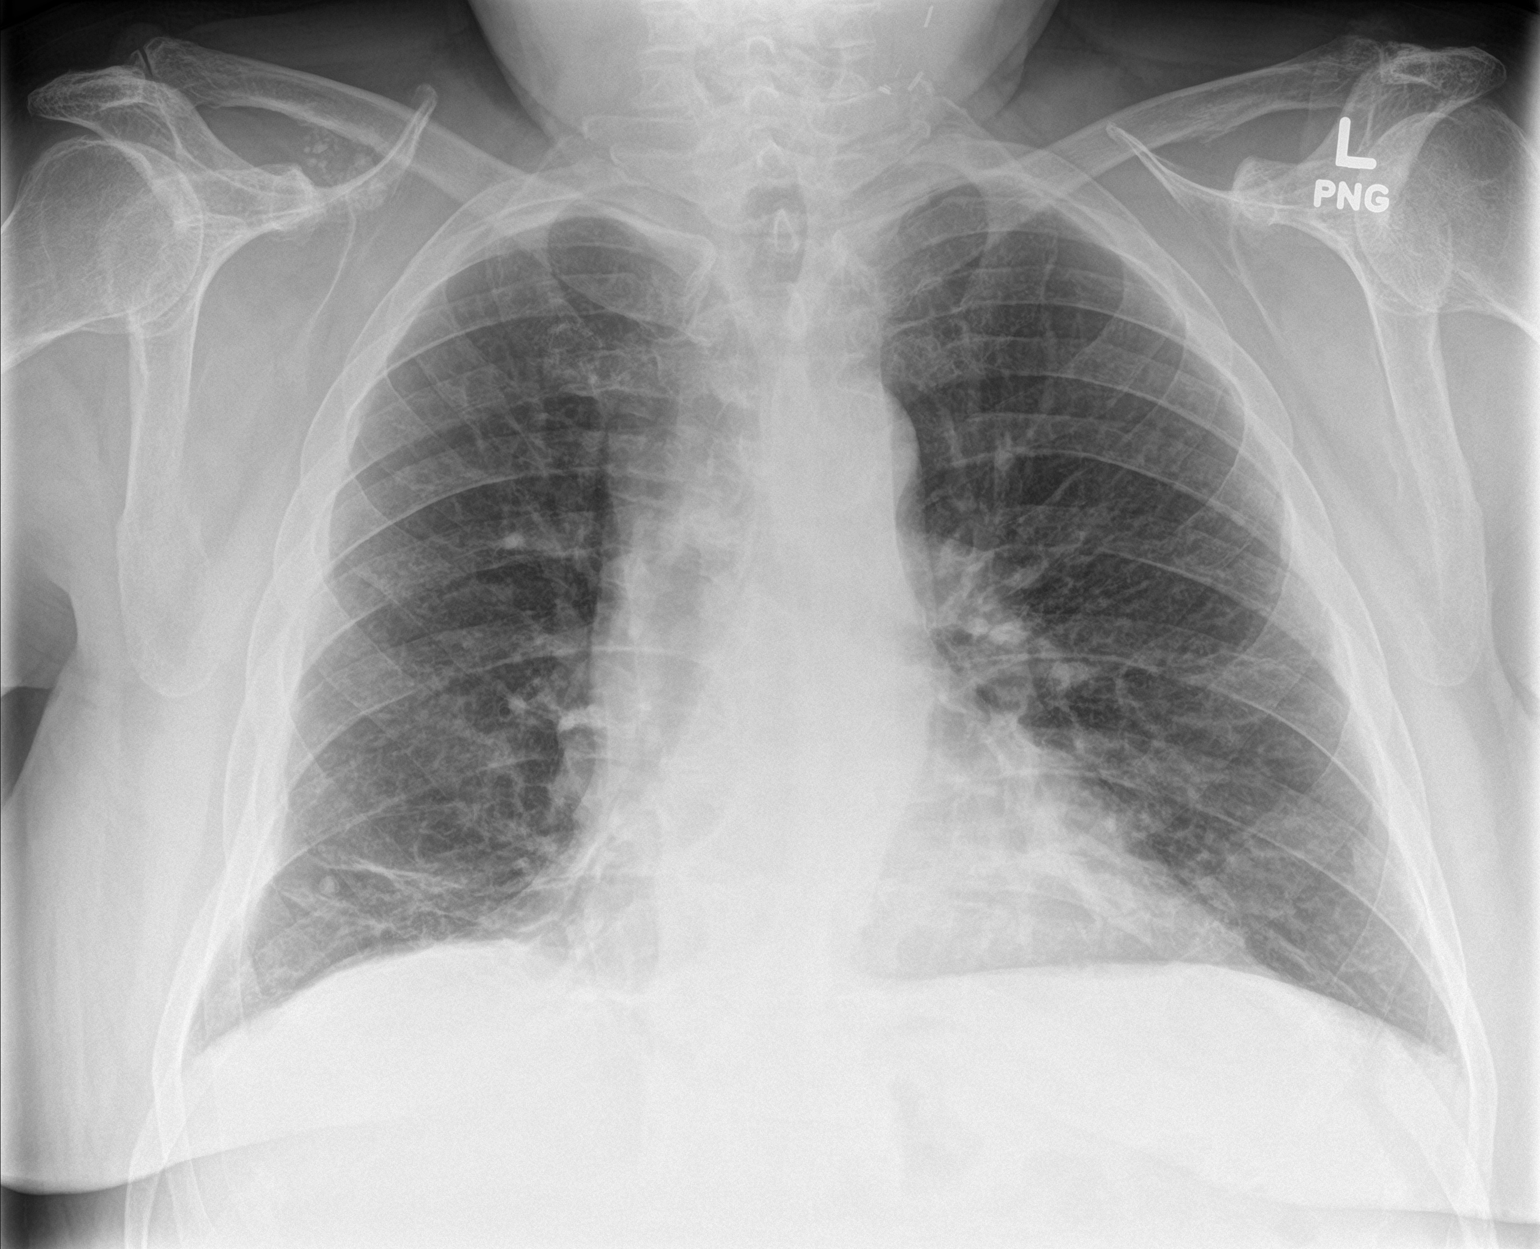

[chest lat]
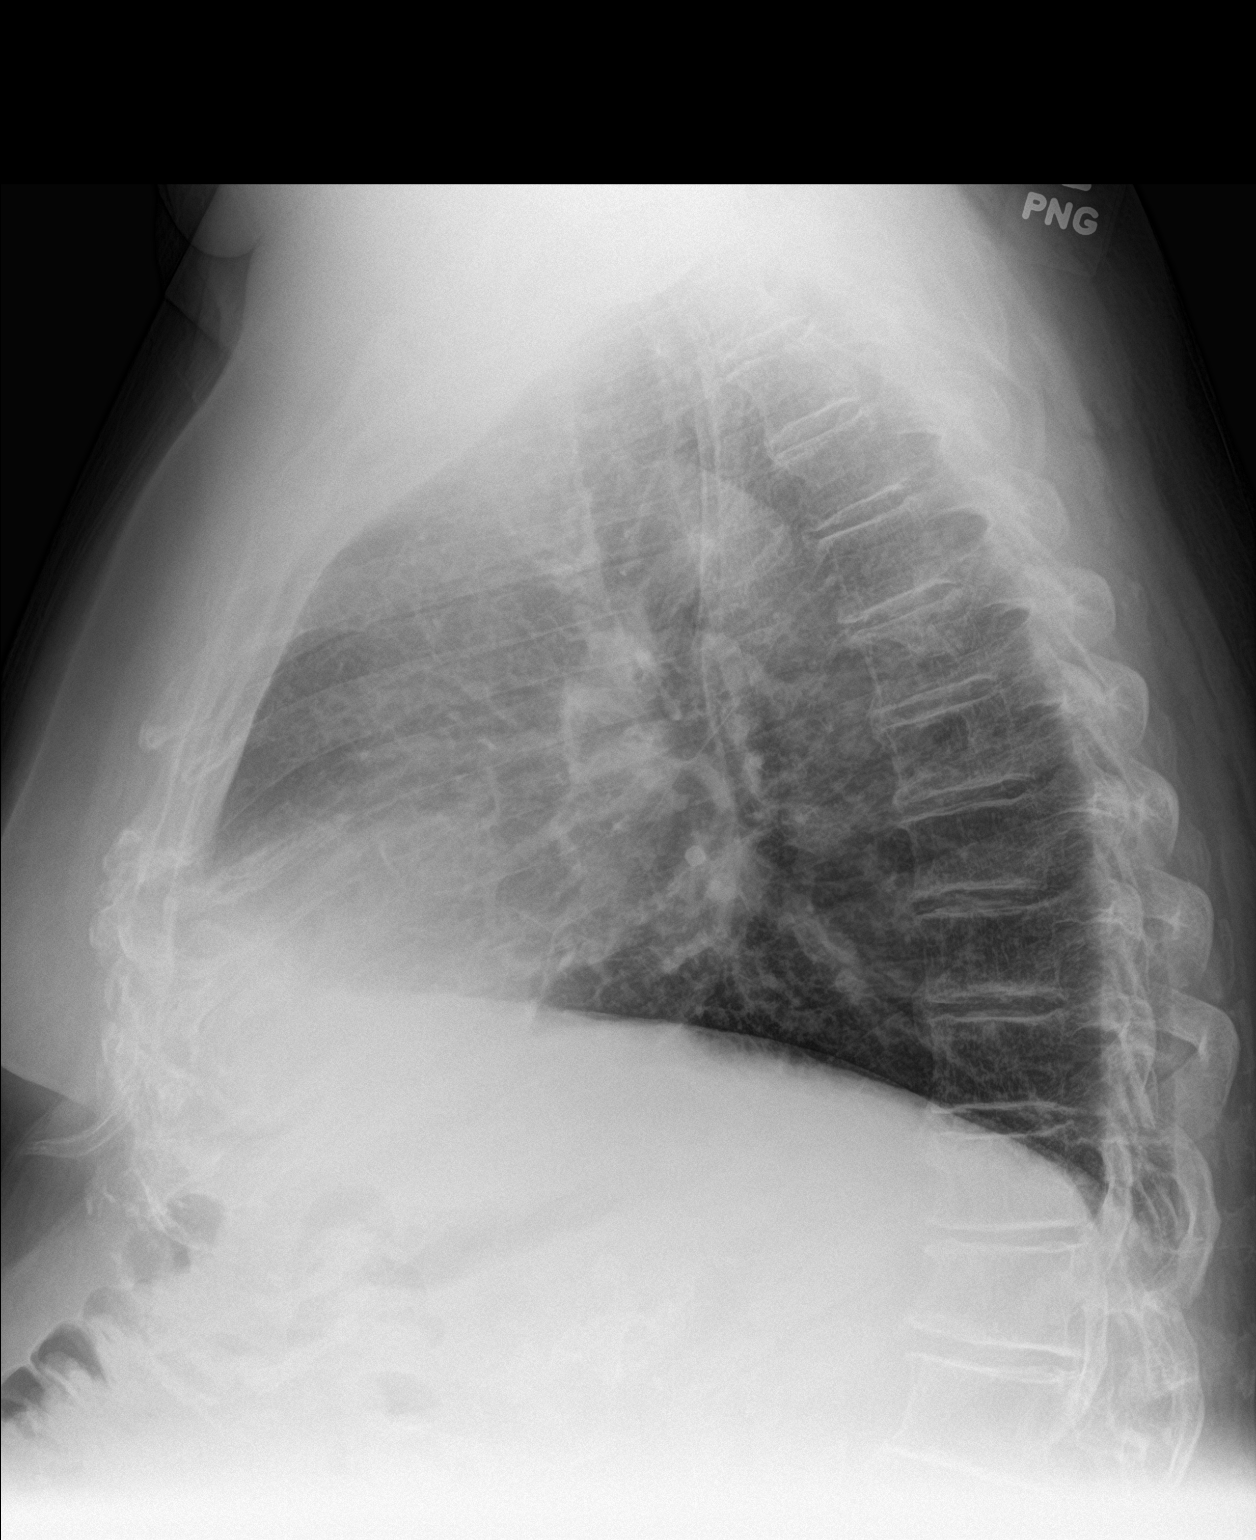

[2 of 2 positions shown; findings below may reference images not displayed]

FINDINGS: There is little change in linear atelectasis and/or scarring at the
lung bases right greater than left. A small nodule at the right lung
base in the region of the scarring also is stable and may represent
a granuloma. No focal infiltrate or effusion is seen. Mediastinal
and hilar contours are unchanged. Calcification overlying the right
shoulder medially is stable. Heart size is stable. No acute bony
abnormality is seen.
IMPRESSION: No active lung disease. Stable scarring or atelectasis at the lung
bases right greater than left.

## 2016-10-03 DIAGNOSIS — I10 Essential (primary) hypertension: Secondary | ICD-10-CM | POA: Diagnosis not present

## 2016-10-03 DIAGNOSIS — I5032 Chronic diastolic (congestive) heart failure: Secondary | ICD-10-CM | POA: Diagnosis not present

## 2016-10-03 DIAGNOSIS — I2510 Atherosclerotic heart disease of native coronary artery without angina pectoris: Secondary | ICD-10-CM | POA: Diagnosis not present

## 2016-10-03 DIAGNOSIS — E785 Hyperlipidemia, unspecified: Secondary | ICD-10-CM | POA: Diagnosis not present

## 2016-10-03 LAB — LIPID PANEL
Cholesterol: 98 mg/dL — ABNORMAL LOW (ref 125–200)
HDL: 40 mg/dL (ref >=40)
LDL Cholesterol: 40 mg/dL (ref <130)
Total CHOL/HDL Ratio: 2.5 Ratio (ref <=5.0)
Triglycerides: 89 mg/dL (ref <150)
VLDL: 18 mg/dL (ref <30)

## 2016-10-03 LAB — HEPATIC FUNCTION PANEL
ALT: 17 U/L (ref 9–46)
AST: 15 U/L (ref 10–35)
Albumin: 3.7 g/dL (ref 3.6–5.1)
Alkaline Phosphatase: 70 U/L (ref 40–115)
Bilirubin, Direct: 0.3 mg/dL — ABNORMAL HIGH (ref <=0.2)
Indirect Bilirubin: 0.7 mg/dL (ref 0.2–1.2)
Total Bilirubin: 1 mg/dL (ref 0.2–1.2)
Total Protein: 6.2 g/dL (ref 6.1–8.1)

## 2016-10-04 ENCOUNTER — Encounter: Payer: Self-pay | Admitting: *Deleted

## 2016-10-07 ENCOUNTER — Other Ambulatory Visit: Payer: Self-pay

## 2016-10-07 NOTE — Patient Outreach (Addendum)
[name] Care Management  [place]  10/07/2016   [name] 1941-05-27 [id number]  Subjective: Telephone call to patient for initial assessment. Patient reports he is doing good.  He reports that he is checking his sugars and they have been ranging 80-140. Discussed with patient the importance of monitoring sugars.  Patient reports that his A1c was 8.6 last check , which was December.  Discussed with patient importance of having A1c checked regularly in order to monitor progress of blood sugar control.  He verbalized understanding. Also discussed with patient maintaining diabetic diet.  He verbalized understanding.   Objective:   Encounter Medications:  Outpatient Encounter Prescriptions as of 10/07/2016  Medication Sig Note  . amLODipine (NORVASC) 10 MG tablet Take 1 tablet (10 mg total) by mouth daily.   [name] aspirin EC 81 MG tablet Take 1 tablet (81 mg total) by mouth daily.   [name] atorvastatin (LIPITOR) 80 MG tablet Take 1 tablet (80 mg total) by mouth daily at 6 PM.   . furosemide (LASIX) 20 MG tablet Take 1 tablet (20 mg total) by mouth daily. 08/22/2016: Takes with 40mg  daily  . furosemide (LASIX) 40 MG tablet Take 1 tablet (40 mg total) by mouth daily. 08/22/2016: Takes with 20mg  daily  . furosemide (LASIX) 40 MG tablet TAKE 1 TABLET BY MOUTH IN THE AM, 1/2 TABLET IN THE EVENING AS DIRECTED *TIME FOR AN OFFICE VISIT*   . glipiZIDE (GLUCOTROL) 10 MG tablet TAKE 1 TABLET (10 MG TOTAL) BY MOUTH 2 (TWO) TIMES DAILY BEFORE A MEAL.   [name] insulin lispro protamine-lispro (HUMALOG 75/25 MIX) (75-25) 100 UNIT/ML SUSP injection Inject 25 Units into the skin daily with supper.    [name] lisinopril (PRINIVIL,ZESTRIL) 40 MG tablet Take 1 tablet (40 mg total) by mouth daily.   . metFORMIN (GLUCOPHAGE) 1000 MG tablet TAKE 1 TABLET BY MOUTH TWICE DAILY WITH MEALS   . nitroGLYCERIN (NITROSTAT) 0.4 MG SL tablet Place 1 tablet (0.4 mg total) under the tongue every 5 (five)  minutes as needed for chest pain.   . pantoprazole (PROTONIX) 40 MG tablet Take 1 tablet (40 mg total) by mouth daily.   [name] spironolactone (ALDACTONE) 25 MG tablet Take 1 tablet (25 mg total) by mouth daily.   . ticagrelor (BRILINTA) 90 MG TABS tablet Take 1 tablet (90 mg total) by mouth 2 (two) times daily.   [name] atenolol (TENORMIN) 100 MG tablet Take 1 tablet (100 mg total) by mouth 2 (two) times daily.    No facility-administered encounter medications on file as of 10/07/2016.     Functional Status:  In your present state of health, do you have any difficulty performing the following activities: 10/07/2016 08/27/2016  Hearing? N -  Vision? N -  Difficulty concentrating or making decisions? N -  Walking or climbing stairs? N -  Dressing or bathing? N -  Doing errands, shopping? N N  Preparing Food and eating ? N -  Using the Toilet? N -  In the past six months, have you accidently leaked urine? N -  Do you have problems with loss of bowel control? N -  Managing your Medications? N -  Managing your Finances? N -  Housekeeping or managing your Housekeeping? N -  Some recent data might be hidden    Fall/Depression Screening: PHQ 2/9 Scores 10/07/2016 07/19/2016 06/26/2016 06/03/2016 10/19/2015 10/19/2015 09/13/2014  PHQ - 2 Score 0 0 0 0 0 0 0  Exception Documentation - (No Data) - - - - -  Assessment: Patient will benefit from health coach outreach for diabetes education for self-management.   Plan:  [facility] CM Care Plan Problem One   Flowsheet Row Most Recent Value  Care Plan Problem One  Elevated A1c   Role Documenting the Problem One  [place] for Problem One  Active  THN Long Term Goal (31-90 days)  Patient's A1c will be decreased by at least 0.5 points in the next 90 days.   THN Long Term Goal Start Date  10/07/16  Interventions for Problem One [place] discussed with importance of having A1c checked regularly. Discussed with patient diabetic  diet.   THN CM Short Term Goal #1 (0-30 days)  Patient will report blood sugar checks less than 150 within 30 days.  THN CM Short Term Goal #1 Start Date  10/07/16  Interventions for Short Term Goal #1  RN Health Coach educated patient on eating plenty of vegetables and limiting carbohydrates.   THN CM Short Term Goal #2 (0-30 days)  Patient will continue to report checking blood sugars regularly within 30 days.   THN CM Short Term Goal #2 Start Date  10/07/16  Interventions for Short Term Goal #2  [place] discussed with patient the importance of checking blood sugars regularly along with taking insulin.       RN Health Coach will provide ongoing education for patient on diabetes through phone calls and sending printed information to patient for further discussion.  RN Health Coach sent welcome letter.  RN Health Coach will send initial barriers letter, assessment, and care plan to primary care physician. RN Health Coach will contact patient in the month of October and patient agrees to next contact.   Jone Baseman, RN, MSN [place] [phone]

## 2016-10-09 ENCOUNTER — Telehealth: Payer: Self-pay | Admitting: Adult Health

## 2016-10-09 ENCOUNTER — Other Ambulatory Visit: Payer: Medicare Other

## 2016-10-09 NOTE — Telephone Encounter (Signed)
Pt states he is out of insulin insulin lispro protamine-lispro (HUMALOG 75/25 MIX) (75-25) 100 UNIT/ML SUSP injection Would like to know if he needs to come in for an A1c, and if he can get any more samples. Will need rx either way

## 2016-10-10 ENCOUNTER — Other Ambulatory Visit: Payer: Self-pay

## 2016-10-10 MED ORDER — INSULIN LISPRO PROT & LISPRO (75-25 MIX) 100 UNIT/ML ~~LOC~~ SUSP: 25.0000 [IU] | Freq: Every day | SUBCUTANEOUS | 3 refills | Status: DC

## 2016-10-10 NOTE — Telephone Encounter (Signed)
I will send in Rx.  Please advise on A1c check and samples. Thanks.

## 2016-10-10 NOTE — Telephone Encounter (Signed)
It is ok to refill insulin for one year. He is due for an A1c check. We do have samples

## 2016-10-10 NOTE — Telephone Encounter (Signed)
I contacted patient - he states he would like me to print out script so he can take it to price at different pharmacies.   When patient comes in to pick up script, he would like to schedule an appt and pick up samples.

## 2016-10-12 ENCOUNTER — Other Ambulatory Visit: Payer: Self-pay | Admitting: Adult Health

## 2016-10-14 ENCOUNTER — Other Ambulatory Visit: Payer: Medicare Other

## 2016-11-05 ENCOUNTER — Other Ambulatory Visit: Payer: Self-pay

## 2016-11-05 NOTE — Patient Outreach (Signed)
[facility]) Care Management  11/05/2016  [name] 1941-02-14 [id number]   Telephone call to patient for monthly call. No answer.  HIPAA compliant voice message left.    Plan: RN Health Coach will attempt patient again in the month of November.    Jone Baseman, RN, MSN [place] [phone]

## 2016-11-12 ENCOUNTER — Encounter: Payer: Self-pay | Admitting: Adult Health

## 2016-11-12 ENCOUNTER — Ambulatory Visit (INDEPENDENT_AMBULATORY_CARE_PROVIDER_SITE_OTHER): Payer: Medicare Other | Admitting: Adult Health

## 2016-11-12 VITALS — BP 138/82 | Temp 98.6°F | Ht 69.5 in | Wt 258.5 lb

## 2016-11-12 DIAGNOSIS — E119 Type 2 diabetes mellitus without complications: Secondary | ICD-10-CM | POA: Diagnosis not present

## 2016-11-12 DIAGNOSIS — I10 Essential (primary) hypertension: Secondary | ICD-10-CM | POA: Diagnosis not present

## 2016-11-12 DIAGNOSIS — Z23 Encounter for immunization: Secondary | ICD-10-CM | POA: Diagnosis not present

## 2016-11-12 DIAGNOSIS — I6523 Occlusion and stenosis of bilateral carotid arteries: Secondary | ICD-10-CM

## 2016-11-12 LAB — POCT GLYCOSYLATED HEMOGLOBIN (HGB A1C): Hemoglobin A1C: 8.1

## 2016-11-12 NOTE — Patient Instructions (Addendum)
It was great seeing you today!  Your A1c is 8.1. This is down from 8.5. I want to go up on your insulin to 27 units.   Follow up in 3 months - you are going to be do for a physical

## 2016-11-12 NOTE — Progress Notes (Signed)
Subjective:    Patient ID: [name], male    DOB: 03-07-1941, 75 y.o.   MRN: [id number]  HPI 75 year old male who presents to the office today for follow up regarding diabetes. His last A1c was 8.6 in December of last year. He is currently taking glipizide 10 mg. Humalog 25 units, and metformin 1000mg  BID.   He reports that his blood sugars are going steadily in the 100-150's. He has only had one blood sugar reading above 200 in the last 3 months.   Since I last saw him he has had two cardiac stents placed. Per note:   Left Heart Cath and Coronary Angiography 08/27/16   Mid RCA lesion, 85 %stenosed.  Dist RCA lesion, 90 %stenosed.  3rd RPLB lesion, 85 %stenosed.  Mid LAD lesion, 20 %stenosed.  Significant multilevel lesion stenoses of 85% of the mid RCA, 90% in the region of the acute margin, and 85-90% distally.  He was started on Brillinta. He has followed up with Cardiology   [place] reports today that his wife in currently admitted to the hospital and just got out of the ICU. She is currently vent dependent via trach and they are trying to bring her off the vent but are having trouble doing so.   Review of Systems  Constitutional: Negative.   Respiratory: Positive for shortness of breath (with dyspnea).   Cardiovascular: Positive for leg swelling (chronic ).  Musculoskeletal: Positive for arthralgias, back pain and gait problem.  All other systems reviewed and are negative.  Past Medical History:  Diagnosis Date  . Acute diastolic congestive heart failure ([place]) 09/20/2015  . Arthritis    "fingers" (08/27/2016)  . CAD (coronary artery disease)    a. 06/2007 PCI LAD->Cypher DES;  b. 02/2010 Cath LM 30-40, LAD 40-53m, patent stent, D2 50, RI 70 ost, LCX 50, OM 30, RCA 30-8m, 30d, PDA 50, RPL 40-50p, nl EF.  . Carotid artery occlusion    a. 07/2011 L CEA;  b. 02/2012 Carotid U/S: RICA 7-90%, LICA patent.  . Chronic back pain    "moves up and down my back"  (08/27/2016)  . Chronic bronchitis ([place])   . CVA (cerebral vascular accident) ([place]) 2001, 2004   "I've only had 1; hemorrhagic; don't remember specifically when" (08/27/2016)  . GERD (gastroesophageal reflux disease)   . Heart murmur   . History of blood transfusion    "related to a surgery I think"  . Hyperlipidemia   . Hypertension   . Kidney stones   . OSA on CPAP 07/20/2015  . Peripheral vascular disease ([place]) 2001  . Pneumonia    "a number of times" (08/27/2016)  . Type II diabetes mellitus ([place])     Social History   Social History  . Marital status: Married    Spouse name: N/A  . Number of children: N/A  . Years of education: N/A   Occupational History  . Medical illustrator    Social History Main Topics  . Smoking status: Never Smoker  . Smokeless tobacco: Never Used  . Alcohol use No  . Drug use: No  . Sexual activity: Not Currently   Other Topics Concern  . Not on file   Social History Narrative   Works part time as an Medical illustrator.   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    One son who lives in [place] - son is adopted.    She takes care of his wife, she is oxygen continuously.  Past Surgical History:  Procedure Laterality Date  . CARDIAC CATHETERIZATION  02/2010; 08/27/2016   "no stent"/notes 03/28/2010; unable to put stent in cause of spasms"  . CARDIAC CATHETERIZATION N/A 08/27/2016   Procedure: Left Heart Cath and Coronary Angiography;  Surgeon: Troy Sine, MD;  Location: [facility];  Service: Cardiovascular;  Laterality: N/A;  . CARDIAC CATHETERIZATION N/A 08/28/2016   Procedure: Coronary Stent Intervention;  Surgeon: Troy Sine, MD;  Location: [facility];  Service: Cardiovascular;  Laterality: N/A;  . CAROTID ENDARTERECTOMY Left 08/23/2011  . CORONARY ANGIOPLASTY WITH STENT PLACEMENT  06-30-07  . CYSTOSCOPY W/ STONE MANIPULATION    . INGUINAL HERNIA REPAIR Right 1962  . [place]   "cut me open"    Family History  Problem  Relation Age of Onset  . Heart disease Mother     CHF Heart Disease before age 4  . Heart disease Father     Heart Disease before age 69  . Cancer Brother     Lung  . Cancer Brother     Lung  . COPD Brother     Allergies  Allergen Reactions  . Clopidogrel Bisulfate Itching and Rash    REACTION: rash, itching from Head to Toe   -  PLAVIX   . Adhesive [Tape] Rash    rash    Current Outpatient Prescriptions on File Prior to Visit  Medication Sig Dispense Refill  . amLODipine (NORVASC) 10 MG tablet Take 1 tablet (10 mg total) by mouth daily. 90 tablet 3  . aspirin EC 81 MG tablet Take 1 tablet (81 mg total) by mouth daily.    [name] atenolol (TENORMIN) 100 MG tablet Take 1 tablet (100 mg total) by mouth 2 (two) times daily. 200 tablet 1  . atorvastatin (LIPITOR) 80 MG tablet Take 1 tablet (80 mg total) by mouth daily at 6 PM. 30 tablet 12  . furosemide (LASIX) 20 MG tablet Take 1 tablet (20 mg total) by mouth daily. 90 tablet 3  . furosemide (LASIX) 40 MG tablet Take 1 tablet (40 mg total) by mouth daily. 90 tablet 3  . furosemide (LASIX) 40 MG tablet TAKE 1 TABLET BY MOUTH IN THE AM, 1/2 TABLET IN THE EVENING AS DIRECTED *TIME FOR AN OFFICE VISIT* 90 tablet 3  . glipiZIDE (GLUCOTROL) 10 MG tablet TAKE 1 TABLET (10 MG TOTAL) BY MOUTH 2 (TWO) TIMES DAILY BEFORE A MEAL. 200 tablet 3  . insulin lispro protamine-lispro (HUMALOG 75/25 MIX) (75-25) 100 UNIT/ML SUSP injection Inject 25 Units into the skin daily with supper. 10 mL 3  . lisinopril (PRINIVIL,ZESTRIL) 40 MG tablet Take 1 tablet (40 mg total) by mouth daily. 100 tablet 3  . metFORMIN (GLUCOPHAGE) 1000 MG tablet TAKE 1 TABLET BY MOUTH TWICE DAILY WITH MEALS 180 tablet 1  . nitroGLYCERIN (NITROSTAT) 0.4 MG SL tablet Place 1 tablet (0.4 mg total) under the tongue every 5 (five) minutes as needed for chest pain. 25 tablet 2  . pantoprazole (PROTONIX) 40 MG tablet Take 1 tablet (40 mg total) by mouth daily. 30 tablet 12  . spironolactone  (ALDACTONE) 25 MG tablet Take 1 tablet (25 mg total) by mouth daily. 90 tablet 3  . ticagrelor (BRILINTA) 90 MG TABS tablet Take 1 tablet (90 mg total) by mouth 2 (two) times daily. 60 tablet 11   No current facility-administered medications on file prior to visit.     BP 138/82   Temp 98.6  F (37 C) (Oral)   Ht 5' 9.5" (1.765 m)   Wt 258 lb 8 oz (117.3 kg)   BMI 37.63 kg/m       Objective:   Physical Exam  Constitutional: He is oriented to person, place, and time. He appears well-developed and well-nourished. No distress.  Cardiovascular: Normal rate, regular rhythm, normal heart sounds and intact distal pulses.  Exam reveals no gallop and no friction rub.   No murmur heard. Pulmonary/Chest: Effort normal and breath sounds normal. No respiratory distress. He has no wheezes. He has no rales. He exhibits no tenderness.  Musculoskeletal: He exhibits edema.  + 1 bilateral pitting edema   Neurological: He is alert and oriented to person, place, and time. He has normal reflexes. He displays normal reflexes. No cranial nerve deficit. He exhibits normal muscle tone. Coordination normal.  Skin: Skin is warm and dry. No rash noted. He is not diaphoretic. No erythema. No pallor.  Psychiatric: He has a normal mood and affect. His behavior is normal. Judgment and thought content normal.  Nursing note and vitals reviewed.     Assessment & Plan:  1. Type 2 diabetes mellitus without complication, unspecified long term insulin use status (HCC) - POC HgB A1c - 8.1. This is down from 8.5  - educated in the importance of a diabetic diet.  - Will increase insulin to 27 units form 25 units.  - Follow up in 3 months or sooner if needed  2. Essential hypertension - Near goal.  - No change in medication at this tim e  3. Need for prophylactic vaccination and inoculation against influenza - Flu vaccine HIGH DOSE PF (Fluzone High dose)  Dorothyann Peng, NP

## 2016-11-13 ENCOUNTER — Other Ambulatory Visit: Payer: Self-pay | Admitting: Family Medicine

## 2016-11-25 ENCOUNTER — Other Ambulatory Visit: Payer: Self-pay

## 2016-11-25 NOTE — Patient Outreach (Signed)
[facility]) Care Management  [place]  11/25/2016   [name] 1941/06/30 [id number]  Subjective: Telephone call to patient for monthly call.  Patient reports he just lost his wife. He states that her memorial service was yesterday. He states he is doing ok with his grief.  Discussed with patient grief and grief counseling. Patient reports that he will be at some point moving to the [place] area to be closer to his son.  He states he has spent some time with them in the last few days.  Patient reports he is doing well with his diabetes and is taking his medication.  Discussed with patient importance of medication and diet in managing his diabetes.  He verbalized understanding.    Objective:   Encounter Medications:  Outpatient Encounter Prescriptions as of 11/25/2016  Medication Sig Note  . amLODipine (NORVASC) 10 MG tablet Take 1 tablet (10 mg total) by mouth daily.   [name] aspirin EC 81 MG tablet Take 1 tablet (81 mg total) by mouth daily.   [name] atenolol (TENORMIN) 100 MG tablet Take 1 tablet (100 mg total) by mouth 2 (two) times daily.   [name] atorvastatin (LIPITOR) 80 MG tablet Take 1 tablet (80 mg total) by mouth daily at 6 PM.   . furosemide (LASIX) 20 MG tablet Take 1 tablet (20 mg total) by mouth daily. 08/22/2016: Takes with 54m daily  . furosemide (LASIX) 40 MG tablet Take 1 tablet (40 mg total) by mouth daily. 08/22/2016: Takes with 242mdaily  . furosemide (LASIX) 40 MG tablet TAKE 1 TABLET BY MOUTH IN THE AM, 1/2 TABLET IN THE EVENING AS DIRECTED *TIME FOR AN OFFICE VISIT*   . glipiZIDE (GLUCOTROL) 10 MG tablet TAKE 1 TABLET (10 MG TOTAL) BY MOUTH 2 (TWO) TIMES DAILY BEFORE A MEAL.   . Marland Kitchennsulin lispro protamine-lispro (HUMALOG 75/25 MIX) (75-25) 100 UNIT/ML SUSP injection Inject 25 Units into the skin daily with supper.   . Marland Kitchenisinopril (PRINIVIL,ZESTRIL) 40 MG tablet Take 1 tablet (40 mg total) by mouth daily.   . metFORMIN (GLUCOPHAGE) 1000 MG tablet  TAKE 1 TABLET BY MOUTH TWICE DAILY WITH MEALS   . nitroGLYCERIN (NITROSTAT) 0.4 MG SL tablet Place 1 tablet (0.4 mg total) under the tongue every 5 (five) minutes as needed for chest pain.   . ONE TOUCH ULTRA TEST test strip USE AS DIRECTED DAILY E11.9   . pantoprazole (PROTONIX) 40 MG tablet Take 1 tablet (40 mg total) by mouth daily.   . Marland Kitchenpironolactone (ALDACTONE) 25 MG tablet Take 1 tablet (25 mg total) by mouth daily.   . ticagrelor (BRILINTA) 90 MG TABS tablet Take 1 tablet (90 mg total) by mouth 2 (two) times daily.    No facility-administered encounter medications on file as of 11/25/2016.     Functional Status:  In your present state of health, do you have any difficulty performing the following activities: 10/07/2016 08/27/2016  Hearing? N -  Vision? N -  Difficulty concentrating or making decisions? N -  Walking or climbing stairs? N -  Dressing or bathing? N -  Doing errands, shopping? N N  Preparing Food and eating ? N -  Using the Toilet? N -  In the past six months, have you accidently leaked urine? N -  Do you have problems with loss of bowel control? N -  Managing your Medications? N -  Managing your Finances? N -  Housekeeping or managing your Housekeeping? N -  Some recent  data might be hidden    Fall/Depression Screening: PHQ 2/9 Scores 11/25/2016 10/07/2016 07/19/2016 06/26/2016 06/03/2016 10/19/2015 10/19/2015  PHQ - 2 Score 0 0 0 0 0 0 0  Exception Documentation - - (No Data) - - - -    Assessment: Patient continues to benefit from health coach outreach for disease management and support.    Plan:  [facility] CM Care Plan Problem One   Flowsheet Row Most Recent Value  Care Plan Problem One  Elevated A1c   Role Documenting the Problem One  [place] for Problem One  Active  [facility] Long Term Goal (31-90 days)  Patient's A1c will be decreased by at least 0.5 points in the next 90 days.   THN Long Term Goal Start Date  10/07/16  Interventions for Problem  One [place] discussed with importance of having A1c checked regularly. Discussed with patient diabetic diet.   [facility] CM Short Term Goal #1 (0-30 days)  Patient will report blood sugar checks less than 150 within 30 days.  [facility] CM Short Term Goal #1 Start Date  11/25/16  Interventions for Short Term Goal #1  RN Health Coach discussed with  patient on importance eating plenty of vegetables and limiting carbohydrates.   THN CM Short Term Goal #2 (0-30 days)  Patient will continue to report checking blood sugars regularly within 30 days.   THN CM Short Term Goal #2 Start Date  10/07/16  [facility] CM Short Term Goal #2 Met Date  11/25/16  Interventions for Short Term Goal #2  Patient reports he is chacking his sugars regularly.      RN Health Coach will contact patient in the month of December and patient agrees to next outreach.  Jone Baseman, RN, MSN [place] [phone]

## 2016-11-26 ENCOUNTER — Telehealth: Payer: Self-pay | Admitting: Adult Health

## 2016-11-26 NOTE — Telephone Encounter (Signed)
Spoke with Mr. Pharmacist, community. Patient stated he would call office back to schedule awv appt.

## 2016-12-17 ENCOUNTER — Other Ambulatory Visit: Payer: Self-pay

## 2016-12-17 NOTE — Patient Outreach (Signed)
[facility]) Care Management  12/17/2016  [name] 07-Aug-1941 [id number]   Telephone call to patient for monthly call.  No answer.  HIPAA compliant voice message left.   Plan: RN Health coach will contact patient again in the month of December.    Jone Baseman, RN, MSN [place] [phone]

## 2016-12-19 ENCOUNTER — Other Ambulatory Visit: Payer: Self-pay | Admitting: Adult Health

## 2016-12-19 NOTE — Telephone Encounter (Signed)
Ok to refill for one year  

## 2016-12-25 ENCOUNTER — Other Ambulatory Visit: Payer: Self-pay

## 2016-12-25 NOTE — Patient Outreach (Signed)
[facility]) Care Management  12/25/2016  Zaccary Creech [place] 10-11-1941 [id number]   2nd telephone call to patient for monthly call. No answer. HIPAA compliant voice message left.  Plan: RN Health Coach will attempt patient in the month of January.  Jone Baseman, RN, MSN [place] [phone]

## 2017-01-02 ENCOUNTER — Other Ambulatory Visit: Payer: Self-pay | Admitting: Adult Health

## 2017-01-17 ENCOUNTER — Other Ambulatory Visit: Payer: Self-pay

## 2017-01-17 ENCOUNTER — Telehealth (HOSPITAL_COMMUNITY): Payer: Self-pay | Admitting: Adult Health

## 2017-01-17 NOTE — Telephone Encounter (Signed)
Pt's wife passed away and has moved to [place] living with his son, he is doing well and will c/b if need to be in class will send information to him concerning the exercise program.... KJ

## 2017-01-17 NOTE — Patient Outreach (Signed)
[facility]) Care Management  01/17/2017  [name] 1941/08/19 [id number]   3rd Telephone call to patient for monthly call. No answer. HIPAA compliant voice message left.    Plan: RN Health Coach will send letter to attempt outreach.   Jone Baseman, RN, MSN [place] [phone]

## 2017-02-03 ENCOUNTER — Other Ambulatory Visit: Payer: Self-pay

## 2017-02-03 NOTE — Patient Outreach (Signed)
[facility]) Care Management  02/03/2017  [name] Jul 13, 1941 [id number]   Patient has not responded to calls or letter.  Will proceed with case closure. Will notify care management assistant of case closure.   Jone Baseman, RN, MSN [place] [phone]

## 2017-02-13 ENCOUNTER — Ambulatory Visit (INDEPENDENT_AMBULATORY_CARE_PROVIDER_SITE_OTHER): Payer: Medicare Other | Admitting: Adult Health

## 2017-02-13 ENCOUNTER — Encounter: Payer: Self-pay | Admitting: Adult Health

## 2017-02-13 VITALS — BP 130/70 | Temp 98.1°F | Ht 69.5 in | Wt 242.4 lb

## 2017-02-13 DIAGNOSIS — J069 Acute upper respiratory infection, unspecified: Secondary | ICD-10-CM

## 2017-02-13 MED ORDER — DOXYCYCLINE HYCLATE 100 MG PO CAPS: 100.0000 mg | ORAL_CAPSULE | Freq: Two times a day (BID) | ORAL | 0 refills | Status: DC

## 2017-02-13 MED ORDER — HYDROCODONE-HOMATROPINE 5-1.5 MG/5ML PO SYRP: 5.0000 mL | ORAL_SOLUTION | Freq: Three times a day (TID) | ORAL | 0 refills | Status: DC | PRN

## 2017-02-13 MED ORDER — PREDNISONE 10 MG PO TABS: ORAL_TABLET | ORAL | 0 refills | Status: DC

## 2017-02-13 NOTE — Progress Notes (Signed)
Subjective:    Patient ID: [name], male    DOB: 1941/09/16, 76 y.o.   MRN: [id number]  URI   This is a new problem. The current episode started 1 to 4 weeks ago (2 weeks ). The problem has been waxing and waning. There has been no fever. Associated symptoms include congestion, coughing, ear pain, rhinorrhea, sinus pain and wheezing. Pertinent negatives include no nausea, plugged ear sensation or sore throat. Treatments tried: Delsym, Mucinex. The treatment provided mild relief.     Wt Readings from Last 3 Encounters:  02/13/17 242 lb 6.4 oz (110 kg)  11/12/16 258 lb 8 oz (117.3 kg)  09/13/16 257 lb 4.8 oz (116.7 kg)     Review of Systems  Constitutional: Negative.   HENT: Positive for congestion, ear pain, postnasal drip, rhinorrhea, sinus pain and sinus pressure. Negative for sore throat.   Eyes: Negative.   Respiratory: Positive for cough, chest tightness and wheezing.   Cardiovascular: Negative.   Gastrointestinal: Negative.  Negative for nausea.  All other systems reviewed and are negative.  Past Medical History:  Diagnosis Date  . Acute diastolic congestive heart failure ([place]) 09/20/2015  . Arthritis    "fingers" (08/27/2016)  . CAD (coronary artery disease)    a. 06/2007 PCI LAD->Cypher DES;  b. 02/2010 Cath LM 30-40, LAD 40-62m, patent stent, D2 50, RI 70 ost, LCX 50, OM 30, RCA 30-56m, 30d, PDA 50, RPL 40-50p, nl EF.  . Carotid artery occlusion    a. 07/2011 L CEA;  b. 02/2012 Carotid U/S: RICA 2-97%, LICA patent.  . Chronic back pain    "moves up and down my back" (08/27/2016)  . Chronic bronchitis ([place])   . CVA (cerebral vascular accident) ([place]) 2001, 2004   "I've only had 1; hemorrhagic; don't remember specifically when" (08/27/2016)  . GERD (gastroesophageal reflux disease)   . Heart murmur   . History of blood transfusion    "related to a surgery I think"  . Hyperlipidemia   . Hypertension   . Kidney stones   . OSA on CPAP 07/20/2015  . Peripheral  vascular disease ([place]) 2001  . Pneumonia    "a number of times" (08/27/2016)  . Type II diabetes mellitus ([place])     Social History   Social History  . Marital status: Married    Spouse name: N/A  . Number of children: N/A  . Years of education: N/A   Occupational History  . Medical illustrator    Social History Main Topics  . Smoking status: Never Smoker  . Smokeless tobacco: Never Used  . Alcohol use No  . Drug use: No  . Sexual activity: Not Currently   Other Topics Concern  . Not on file   Social History Narrative   Works part time as an Medical illustrator.   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    One son who lives in [place] - son is adopted.    She takes care of his wife, she is oxygen continuously.     Past Surgical History:  Procedure Laterality Date  . CARDIAC CATHETERIZATION  02/2010; 08/27/2016   "no stent"/notes 03/28/2010; unable to put stent in cause of spasms"  . CARDIAC CATHETERIZATION N/A 08/27/2016   Procedure: Left Heart Cath and Coronary Angiography;  Surgeon: Troy Sine, MD;  Location: [facility];  Service: Cardiovascular;  Laterality: N/A;  . CARDIAC CATHETERIZATION N/A 08/28/2016   Procedure: Coronary Stent Intervention;  Surgeon: Troy Sine, MD;  Location: [facility];  Service: Cardiovascular;  Laterality: N/A;  . CAROTID ENDARTERECTOMY Left 08/23/2011  . CORONARY ANGIOPLASTY WITH STENT PLACEMENT  06-30-07  . CYSTOSCOPY W/ STONE MANIPULATION    . INGUINAL HERNIA REPAIR Right 1962  . [place]   "cut me open"    Family History  Problem Relation Age of Onset  . Heart disease Mother     CHF Heart Disease before age 14  . Heart disease Father     Heart Disease before age 76  . Cancer Brother     Lung  . Cancer Brother     Lung  . COPD Brother     Allergies  Allergen Reactions  . Clopidogrel Bisulfate Itching and Rash    REACTION: rash, itching from Head to Toe   -  PLAVIX   . Adhesive [Tape] Rash    rash    Current  Outpatient Prescriptions on File Prior to Visit  Medication Sig Dispense Refill  . amLODipine (NORVASC) 10 MG tablet TAKE 1 TABLET (10 MG TOTAL) BY MOUTH DAILY. 90 tablet 0  . aspirin EC 81 MG tablet Take 1 tablet (81 mg total) by mouth daily.    [name] atenolol (TENORMIN) 100 MG tablet TAKE 1 TABLET (100 MG TOTAL) BY MOUTH 2 (TWO) TIMES DAILY. 200 tablet 0  . atorvastatin (LIPITOR) 80 MG tablet Take 1 tablet (80 mg total) by mouth daily at 6 PM. 30 tablet 12  . furosemide (LASIX) 20 MG tablet Take 1 tablet (20 mg total) by mouth daily. 90 tablet 3  . furosemide (LASIX) 40 MG tablet TAKE 1 TABLET BY MOUTH IN THE AM, 1/2 TABLET IN THE EVENING AS DIRECTED *TIME FOR AN OFFICE VISIT* 90 tablet 3  . glipiZIDE (GLUCOTROL) 10 MG tablet TAKE 1 TABLET (10 MG TOTAL) BY MOUTH 2 (TWO) TIMES DAILY BEFORE A MEAL. 200 tablet 2  . insulin lispro protamine-lispro (HUMALOG 75/25 MIX) (75-25) 100 UNIT/ML SUSP injection Inject 25 Units into the skin daily with supper. 10 mL 3  . lisinopril (PRINIVIL,ZESTRIL) 40 MG tablet Take 1 tablet (40 mg total) by mouth daily. 100 tablet 3  . metFORMIN (GLUCOPHAGE) 1000 MG tablet TAKE 1 TABLET BY MOUTH TWICE DAILY WITH MEALS 180 tablet 1  . nitroGLYCERIN (NITROSTAT) 0.4 MG SL tablet Place 1 tablet (0.4 mg total) under the tongue every 5 (five) minutes as needed for chest pain. 25 tablet 2  . ONE TOUCH ULTRA TEST test strip USE AS DIRECTED DAILY E11.9 100 each 3  . pantoprazole (PROTONIX) 40 MG tablet Take 1 tablet (40 mg total) by mouth daily. 30 tablet 12  . spironolactone (ALDACTONE) 25 MG tablet Take 1 tablet (25 mg total) by mouth daily. 90 tablet 3  . ticagrelor (BRILINTA) 90 MG TABS tablet Take 1 tablet (90 mg total) by mouth 2 (two) times daily. 60 tablet 11   No current facility-administered medications on file prior to visit.     BP 130/70   Temp 98.1 F (36.7 C) (Oral)   Ht 5' 9.5" (1.765 m)   Wt 242 lb 6.4 oz (110 kg)   BMI 35.28 kg/m       Objective:    Physical Exam  Constitutional: He appears well-developed and well-nourished. No distress.  HENT:  Head: Normocephalic and atraumatic.  Right Ear: Hearing, tympanic membrane, external ear and ear canal normal.  Left Ear: External ear normal.  Nose: Mucosal edema and rhinorrhea present. Right sinus exhibits maxillary sinus  tenderness. Left sinus exhibits maxillary sinus tenderness.  Mouth/Throat: Uvula is midline. Oropharyngeal exudate present.  Eyes: Conjunctivae and EOM are normal. Pupils are equal, round, and reactive to light. Right eye exhibits no discharge. Left eye exhibits no discharge. No scleral icterus.  Neck: Normal range of motion. Neck supple. No thyromegaly present.  Cardiovascular: Normal rate, regular rhythm, normal heart sounds and intact distal pulses.  Exam reveals no gallop and no friction rub.   No murmur heard. Pulmonary/Chest: Effort normal. He has wheezes (trace throughout). He has no rales. He exhibits no tenderness.  Lymphadenopathy:    He has no cervical adenopathy.  Skin: Skin is warm and dry. No rash noted. He is not diaphoretic. No erythema. No pallor.  Psychiatric: He has a normal mood and affect. His behavior is normal. Judgment and thought content normal.  Nursing note and vitals reviewed.     Assessment & Plan:  1. Upper respiratory tract infection, unspecified type - doxycycline (VIBRAMYCIN) 100 MG capsule; Take 1 capsule (100 mg total) by mouth 2 (two) times daily.  Dispense: 14 capsule; Refill: 0 - predniSONE (DELTASONE) 10 MG tablet; 40 mg x 3 days, 20 mg x 3 days, 10 mg x 3 days  Dispense: 21 tablet; Refill: 0 - HYDROcodone-homatropine (HYCODAN) 5-1.5 MG/5ML syrup; Take 5 mLs by mouth every 8 (eight) hours as needed for cough.  Dispense: 120 mL; Refill: 0 - Follow up if no improvement  Dorothyann Peng, NP

## 2017-02-19 ENCOUNTER — Ambulatory Visit (INDEPENDENT_AMBULATORY_CARE_PROVIDER_SITE_OTHER): Payer: Medicare Other | Admitting: Adult Health

## 2017-02-19 ENCOUNTER — Encounter: Payer: Self-pay | Admitting: Adult Health

## 2017-02-19 VITALS — BP 124/68 | HR 86 | Temp 97.7°F | Ht 69.5 in | Wt 242.2 lb

## 2017-02-19 DIAGNOSIS — J069 Acute upper respiratory infection, unspecified: Secondary | ICD-10-CM | POA: Diagnosis not present

## 2017-02-19 MED ORDER — IPRATROPIUM-ALBUTEROL 0.5-2.5 (3) MG/3ML IN SOLN: 3.0000 mL | Freq: Once | RESPIRATORY_TRACT | Status: AC

## 2017-02-19 MED ORDER — PREDNISONE 10 MG PO TABS: 10.0000 mg | ORAL_TABLET | Freq: Every day | ORAL | 0 refills | Status: DC

## 2017-02-19 MED ORDER — ALBUTEROL SULFATE HFA 108 (90 BASE) MCG/ACT IN AERS: 2.0000 | INHALATION_SPRAY | Freq: Four times a day (QID) | RESPIRATORY_TRACT | 0 refills | Status: AC | PRN

## 2017-02-19 MED ORDER — BENZONATATE 200 MG PO CAPS: 200.0000 mg | ORAL_CAPSULE | Freq: Two times a day (BID) | ORAL | 1 refills | Status: DC | PRN

## 2017-02-19 NOTE — Patient Instructions (Signed)
I have sent in a prescription for   5 more days of prednisone.   An Albuterol inhaler and Tessalon pearls   Use medications as directed  Follow up if no improvement

## 2017-02-19 NOTE — Progress Notes (Signed)
Subjective:    Patient ID: [name], male    DOB: 03-10-1941, 76 y.o.   MRN: [id number]  HPI  76 year old male who  has a past medical history of Acute diastolic congestive heart failure ([place]) (09/20/2015); Arthritis; CAD (coronary artery disease); Carotid artery occlusion; Chronic back pain; Chronic bronchitis (HCC); CVA (cerebral vascular accident) ([place]) (2001, 2004); GERD (gastroesophageal reflux disease); Heart murmur; History of blood transfusion; Hyperlipidemia; Hypertension; Kidney stones; OSA on CPAP (07/20/2015); Peripheral vascular disease ([place]) (2001); Pneumonia; and Type II diabetes mellitus ([place]).   He was seen 6 days ago and diagnosed with a URI. He was prescribed prednisone and doxycycline. He reports that he felt improved the day after treatment started but 3 days after his symptoms returned. He continues to have a dry constant cough, fatigue, wheezing, chest tightness and episodes of shortness of breath.   His sinus issues have resolved    Review of Systems See HPI   Past Medical History:  Diagnosis Date  . Acute diastolic congestive heart failure ([place]) 09/20/2015  . Arthritis    "fingers" (08/27/2016)  . CAD (coronary artery disease)    a. 06/2007 PCI LAD->Cypher DES;  b. 02/2010 Cath LM 30-40, LAD 40-81m, patent stent, D2 50, RI 70 ost, LCX 50, OM 30, RCA 30-31m, 30d, PDA 50, RPL 40-50p, nl EF.  . Carotid artery occlusion    a. 07/2011 L CEA;  b. 02/2012 Carotid U/S: RICA 8-46%, LICA patent.  . Chronic back pain    "moves up and down my back" (08/27/2016)  . Chronic bronchitis ([place])   . CVA (cerebral vascular accident) ([place]) 2001, 2004   "I've only had 1; hemorrhagic; don't remember specifically when" (08/27/2016)  . GERD (gastroesophageal reflux disease)   . Heart murmur   . History of blood transfusion    "related to a surgery I think"  . Hyperlipidemia   . Hypertension   . Kidney stones   . OSA on CPAP 07/20/2015  . Peripheral vascular disease ([place])  2001  . Pneumonia    "a number of times" (08/27/2016)  . Type II diabetes mellitus ([place])     Social History   Social History  . Marital status: Married    Spouse name: N/A  . Number of children: N/A  . Years of education: N/A   Occupational History  . Medical illustrator    Social History Main Topics  . Smoking status: Never Smoker  . Smokeless tobacco: Never Used  . Alcohol use No  . Drug use: No  . Sexual activity: Not Currently   Other Topics Concern  . Not on file   Social History Narrative   Works part time as an Medical illustrator.   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    One son who lives in [place] - son is adopted.    She takes care of his wife, she is oxygen continuously.     Past Surgical History:  Procedure Laterality Date  . CARDIAC CATHETERIZATION  02/2010; 08/27/2016   "no stent"/notes 03/28/2010; unable to put stent in cause of spasms"  . CARDIAC CATHETERIZATION N/A 08/27/2016   Procedure: Left Heart Cath and Coronary Angiography;  Surgeon: Troy Sine, MD;  Location: [facility];  Service: Cardiovascular;  Laterality: N/A;  . CARDIAC CATHETERIZATION N/A 08/28/2016   Procedure: Coronary Stent Intervention;  Surgeon: Troy Sine, MD;  Location: [facility];  Service: Cardiovascular;  Laterality: N/A;  . CAROTID ENDARTERECTOMY Left 08/23/2011  .  CORONARY ANGIOPLASTY WITH STENT PLACEMENT  06-30-07  . CYSTOSCOPY W/ STONE MANIPULATION    . INGUINAL HERNIA REPAIR Right 1962  . [place]   "cut me open"    Family History  Problem Relation Age of Onset  . Heart disease Mother     CHF Heart Disease before age 16  . Heart disease Father     Heart Disease before age 21  . Cancer Brother     Lung  . Cancer Brother     Lung  . COPD Brother     Allergies  Allergen Reactions  . Clopidogrel Bisulfate Itching and Rash    REACTION: rash, itching from Head to Toe   -  PLAVIX   . Adhesive [Tape] Rash    rash    Current Outpatient  Prescriptions on File Prior to Visit  Medication Sig Dispense Refill  . amLODipine (NORVASC) 10 MG tablet TAKE 1 TABLET (10 MG TOTAL) BY MOUTH DAILY. 90 tablet 0  . aspirin EC 81 MG tablet Take 1 tablet (81 mg total) by mouth daily.    [name] atenolol (TENORMIN) 100 MG tablet TAKE 1 TABLET (100 MG TOTAL) BY MOUTH 2 (TWO) TIMES DAILY. 200 tablet 0  . atorvastatin (LIPITOR) 80 MG tablet Take 1 tablet (80 mg total) by mouth daily at 6 PM. 30 tablet 12  . doxycycline (VIBRAMYCIN) 100 MG capsule Take 1 capsule (100 mg total) by mouth 2 (two) times daily. 14 capsule 0  . furosemide (LASIX) 20 MG tablet Take 1 tablet (20 mg total) by mouth daily. 90 tablet 3  . furosemide (LASIX) 40 MG tablet TAKE 1 TABLET BY MOUTH IN THE AM, 1/2 TABLET IN THE EVENING AS DIRECTED *TIME FOR AN OFFICE VISIT* 90 tablet 3  . glipiZIDE (GLUCOTROL) 10 MG tablet TAKE 1 TABLET (10 MG TOTAL) BY MOUTH 2 (TWO) TIMES DAILY BEFORE A MEAL. 200 tablet 2  . HYDROcodone-homatropine (HYCODAN) 5-1.5 MG/5ML syrup Take 5 mLs by mouth every 8 (eight) hours as needed for cough. 120 mL 0  . insulin lispro protamine-lispro (HUMALOG 75/25 MIX) (75-25) 100 UNIT/ML SUSP injection Inject 25 Units into the skin daily with supper. 10 mL 3  . lisinopril (PRINIVIL,ZESTRIL) 40 MG tablet Take 1 tablet (40 mg total) by mouth daily. 100 tablet 3  . metFORMIN (GLUCOPHAGE) 1000 MG tablet TAKE 1 TABLET BY MOUTH TWICE DAILY WITH MEALS 180 tablet 1  . nitroGLYCERIN (NITROSTAT) 0.4 MG SL tablet Place 1 tablet (0.4 mg total) under the tongue every 5 (five) minutes as needed for chest pain. 25 tablet 2  . ONE TOUCH ULTRA TEST test strip USE AS DIRECTED DAILY E11.9 100 each 3  . pantoprazole (PROTONIX) 40 MG tablet Take 1 tablet (40 mg total) by mouth daily. 30 tablet 12  . spironolactone (ALDACTONE) 25 MG tablet Take 1 tablet (25 mg total) by mouth daily. 90 tablet 3  . ticagrelor (BRILINTA) 90 MG TABS tablet Take 1 tablet (90 mg total) by mouth 2 (two) times daily. 60  tablet 11   No current facility-administered medications on file prior to visit.     BP 124/68   Pulse 86   Temp 97.7 F (36.5 C) (Oral)   Ht 5' 9.5" (1.765 m)   Wt 242 lb 3.2 oz (109.9 kg)   SpO2 96%   BMI 35.25 kg/m       Objective:   Physical Exam  Constitutional: He appears well-developed and well-nourished. No distress.  Cardiovascular: Normal  rate, regular rhythm, normal heart sounds and intact distal pulses.  Exam reveals no gallop and no friction rub.   No murmur heard. Pulmonary/Chest: Effort normal. No respiratory distress. He has wheezes (wheezing with cough. ). He has no rales. He exhibits no tenderness.  Skin: Skin is warm and dry. No rash noted. He is not diaphoretic. No erythema. No pallor.  Psychiatric: He has a normal mood and affect. His behavior is normal. Judgment and thought content normal.  Nursing note and vitals reviewed.     Assessment & Plan:  1. Upper respiratory tract infection, unspecified type - ipratropium-albuterol (DUONEB) 0.5-2.5 (3) MG/3ML nebulizer solution 3 mL; Take 3 mLs by nebulization once. - albuterol (PROVENTIL HFA;VENTOLIN HFA) 108 (90 Base) MCG/ACT inhaler; Inhale 2 puffs into the lungs every 6 (six) hours as needed for wheezing or shortness of breath.  Dispense: 1 Inhaler; Refill: 0 - benzonatate (TESSALON) 200 MG capsule; Take 1 capsule (200 mg total) by mouth 2 (two) times daily as needed for cough.  Dispense: 20 capsule; Refill: 1 - predniSONE (DELTASONE) 10 MG tablet; Take 1 tablet (10 mg total) by mouth daily with breakfast.  Dispense: 5 tablet; Refill: 0 - Patient reported feeling much improved after breathing treatment. On exam wheezing had resolved. No rhonchi or rales noted. He also felt as though this cough had improved.   - Follow up as needed Dorothyann Peng, NP

## 2017-02-21 ENCOUNTER — Telehealth: Payer: Self-pay | Admitting: Cardiology

## 2017-02-21 NOTE — Telephone Encounter (Signed)
Spoke with pt, Follow up scheduled end of April. He is questioning if he needs to continue brilinta. Will forward for dr Stanford Breed review

## 2017-02-21 NOTE — Telephone Encounter (Signed)
Spoke with pt, Aware of dr crenshaw's recommendations.  °

## 2017-02-21 NOTE — Telephone Encounter (Signed)
DES 8/17. Continue [place]

## 2017-02-21 NOTE — Telephone Encounter (Signed)
New message   Pt verbalized that he is calling to speak to the rn because he   Wants to know if he should start or stop Brlinta

## 2017-03-13 ENCOUNTER — Telehealth: Payer: Self-pay | Admitting: Adult Health

## 2017-03-13 ENCOUNTER — Encounter: Payer: Self-pay | Admitting: Family

## 2017-03-13 NOTE — Telephone Encounter (Signed)
Patient Name: [name]  DOB: March 24, 1941    Initial Comment Caller is having shortness of breath, and thinks it may be from mixing his meds up. Takes BP med, blood thinner, and 3 fluid pills.    Nurse Assessment  Nurse: Christel Mormon, RN, Levada Dy Date/Time (Eastern Time): 03/13/2017 5:01:06 PM  Confirm and document reason for call. If symptomatic, describe symptoms. ---Caller is out of town with his son. He is having shortness of breath. He thinks it may be from mixing his meds up. He uses a CPAP last night and had some shortness of breath. He took off the CPAP and was better. This am, he was short of breath and having trouble breathing. He has been ok the rest of the day. He stated he takes 3 diuretics. He has not taken those as he has been "running the road" between [place] and his son's and [place]. Today his feet are swollen. He has not taken the water pill yet.  Does the patient have any new or worsening symptoms? ---Yes  Will a triage be completed? ---Yes  Related visit to physician within the last 2 weeks? ---No  Does the PT have any chronic conditions? (i.e. diabetes, asthma, etc.) ---Yes  List chronic conditions. ---CAD, stents, sleep apnea, HTN  Is this a behavioral health or substance abuse call? ---No     Guidelines    Guideline Title Affirmed Question Affirmed Notes  Breathing Difficulty [1] MILD longstanding difficulty breathing AND [2] SAME as normal    Final Disposition User   See PCP within [place], RN, Levada Dy    Comments  pt has been going back and forth between his sons house, [place] and [place]- he states being on the road so much- he did not take his 3 diuretics for about 3 days. It dawned on him this am that he has not been taking them. This am, his feet were also swollen. He is NOT having any symptoms at this time although this am- very SOB. He was instructed to start his diuretics today, IF the SOB returns then he is to go to the ER where he is.  Currently out of town at [employer].   Referrals  GO TO FACILITY UNDECIDED   Disagree/Comply: Comply

## 2017-03-14 NOTE — Telephone Encounter (Signed)
Pt has been scheduled 3/28, as that is the earliest he will be in town. Pt advised to seek UC or ED if symptoms worsen. Nothing further needed at this time.

## 2017-03-16 DIAGNOSIS — Z9114 Patient's other noncompliance with medication regimen: Secondary | ICD-10-CM | POA: Diagnosis not present

## 2017-03-16 DIAGNOSIS — R0602 Shortness of breath: Secondary | ICD-10-CM | POA: Diagnosis not present

## 2017-03-16 DIAGNOSIS — E785 Hyperlipidemia, unspecified: Secondary | ICD-10-CM | POA: Diagnosis not present

## 2017-03-16 DIAGNOSIS — E119 Type 2 diabetes mellitus without complications: Secondary | ICD-10-CM | POA: Diagnosis not present

## 2017-03-16 DIAGNOSIS — Z7984 Long term (current) use of oral hypoglycemic drugs: Secondary | ICD-10-CM | POA: Diagnosis not present

## 2017-03-16 DIAGNOSIS — J189 Pneumonia, unspecified organism: Secondary | ICD-10-CM | POA: Diagnosis not present

## 2017-03-16 DIAGNOSIS — R079 Chest pain, unspecified: Secondary | ICD-10-CM | POA: Diagnosis not present

## 2017-03-16 DIAGNOSIS — I10 Essential (primary) hypertension: Secondary | ICD-10-CM | POA: Diagnosis not present

## 2017-03-16 DIAGNOSIS — Z79899 Other long term (current) drug therapy: Secondary | ICD-10-CM | POA: Diagnosis not present

## 2017-03-16 DIAGNOSIS — J9811 Atelectasis: Secondary | ICD-10-CM | POA: Diagnosis not present

## 2017-03-16 DIAGNOSIS — I2510 Atherosclerotic heart disease of native coronary artery without angina pectoris: Secondary | ICD-10-CM | POA: Diagnosis not present

## 2017-03-16 DIAGNOSIS — R0600 Dyspnea, unspecified: Secondary | ICD-10-CM | POA: Diagnosis not present

## 2017-03-23 DIAGNOSIS — I6789 Other cerebrovascular disease: Secondary | ICD-10-CM | POA: Diagnosis not present

## 2017-03-23 DIAGNOSIS — F4489 Other dissociative and conversion disorders: Secondary | ICD-10-CM | POA: Diagnosis not present

## 2017-03-26 ENCOUNTER — Ambulatory Visit (INDEPENDENT_AMBULATORY_CARE_PROVIDER_SITE_OTHER): Payer: Medicare Other | Admitting: Family

## 2017-03-26 ENCOUNTER — Other Ambulatory Visit: Payer: Self-pay | Admitting: Family Medicine

## 2017-03-26 ENCOUNTER — Telehealth: Payer: Self-pay | Admitting: Cardiology

## 2017-03-26 ENCOUNTER — Encounter: Payer: Self-pay | Admitting: Family

## 2017-03-26 ENCOUNTER — Ambulatory Visit (HOSPITAL_COMMUNITY)
Admission: RE | Admit: 2017-03-26 | Discharge: 2017-03-26 | Disposition: A | Discharge status: Home / Self Care | Payer: Medicare Other | Source: Ambulatory Visit | Attending: Family | Admitting: Family

## 2017-03-26 ENCOUNTER — Other Ambulatory Visit: Payer: Self-pay | Admitting: Adult Health

## 2017-03-26 ENCOUNTER — Ambulatory Visit (INDEPENDENT_AMBULATORY_CARE_PROVIDER_SITE_OTHER): Payer: Medicare Other | Admitting: Adult Health

## 2017-03-26 VITALS — BP 128/64 | HR 87 | Temp 97.6°F | Resp 20 | Ht 69.5 in | Wt 248.0 lb

## 2017-03-26 VITALS — BP 122/68 | Temp 97.4°F | Ht 69.5 in | Wt 246.5 lb

## 2017-03-26 DIAGNOSIS — E119 Type 2 diabetes mellitus without complications: Secondary | ICD-10-CM | POA: Diagnosis not present

## 2017-03-26 DIAGNOSIS — I6523 Occlusion and stenosis of bilateral carotid arteries: Secondary | ICD-10-CM

## 2017-03-26 DIAGNOSIS — Z9889 Other specified postprocedural states: Secondary | ICD-10-CM

## 2017-03-26 DIAGNOSIS — I6522 Occlusion and stenosis of left carotid artery: Secondary | ICD-10-CM | POA: Diagnosis not present

## 2017-03-26 DIAGNOSIS — Z8673 Personal history of transient ischemic attack (TIA), and cerebral infarction without residual deficits: Secondary | ICD-10-CM | POA: Diagnosis not present

## 2017-03-26 DIAGNOSIS — I872 Venous insufficiency (chronic) (peripheral): Secondary | ICD-10-CM | POA: Diagnosis not present

## 2017-03-26 DIAGNOSIS — I499 Cardiac arrhythmia, unspecified: Secondary | ICD-10-CM | POA: Diagnosis not present

## 2017-03-26 DIAGNOSIS — Z794 Long term (current) use of insulin: Secondary | ICD-10-CM

## 2017-03-26 LAB — POCT GLYCOSYLATED HEMOGLOBIN (HGB A1C): Hemoglobin A1C: 7.6

## 2017-03-26 MED ORDER — GLUCOSE BLOOD VI STRP: 1.0000 | ORAL_STRIP | Freq: Three times a day (TID) | 3 refills | Status: AC

## 2017-03-26 NOTE — Patient Instructions (Addendum)
It has been my pleasure having you as a patient. I wish you the very best and a long healthy and happy life!  Please let me know if you ever need anything  I have sent in a new prescription for your test strips   Cut back to 20 units of insulin with dinner and monitor your blood sugars three times a day.   Your A1c today is 7.6. This is down from 8.1.

## 2017-03-26 NOTE — Patient Instructions (Addendum)
Stroke Prevention Some medical conditions and behaviors are associated with an increased chance of having a stroke. You may prevent a stroke by making healthy choices and managing medical conditions. How can I reduce my risk of having a stroke?  Stay physically active. Get at least 30 minutes of activity on most or all days.  Do not smoke. It may also be helpful to avoid exposure to secondhand smoke.  Limit alcohol use. Moderate alcohol use is considered to be:  No more than 2 drinks per day for men.  No more than 1 drink per day for nonpregnant women.  Eat healthy foods. This involves:  Eating 5 or more servings of fruits and vegetables a day.  Making dietary changes that address high blood pressure (hypertension), high cholesterol, diabetes, or obesity.  Manage your cholesterol levels.  Making food choices that are high in fiber and low in saturated fat, trans fat, and cholesterol may control cholesterol levels.  Take any prescribed medicines to control cholesterol as directed by your health care provider.  Manage your diabetes.  Controlling your carbohydrate and sugar intake is recommended to manage diabetes.  Take any prescribed medicines to control diabetes as directed by your health care provider.  Control your hypertension.  Making food choices that are low in salt (sodium), saturated fat, trans fat, and cholesterol is recommended to manage hypertension.  Ask your health care provider if you need treatment to lower your blood pressure. Take any prescribed medicines to control hypertension as directed by your health care provider.  If you are 18-39 years of age, have your blood pressure checked every 3-5 years. If you are 40 years of age or older, have your blood pressure checked every year.  Maintain a healthy weight.  Reducing calorie intake and making food choices that are low in sodium, saturated fat, trans fat, and cholesterol are recommended to manage  weight.  Stop drug abuse.  Avoid taking birth control pills.  Talk to your health care provider about the risks of taking birth control pills if you are over 35 years old, smoke, get migraines, or have ever had a blood clot.  Get evaluated for sleep disorders (sleep apnea).  Talk to your health care provider about getting a sleep evaluation if you snore a lot or have excessive sleepiness.  Take medicines only as directed by your health care provider.  For some people, aspirin or blood thinners (anticoagulants) are helpful in reducing the risk of forming abnormal blood clots that can lead to stroke. If you have the irregular heart rhythm of atrial fibrillation, you should be on a blood thinner unless there is a good reason you cannot take them.  Understand all your medicine instructions.  Make sure that other conditions (such as anemia or atherosclerosis) are addressed. Get help right away if:  You have sudden weakness or numbness of the face, arm, or leg, especially on one side of the body.  Your face or eyelid droops to one side.  You have sudden confusion.  You have trouble speaking (aphasia) or understanding.  You have sudden trouble seeing in one or both eyes.  You have sudden trouble walking.  You have dizziness.  You have a loss of balance or coordination.  You have a sudden, severe headache with no known cause.  You have new chest pain or an irregular heartbeat. Any of these symptoms may represent a serious problem that is an emergency. Do not wait to see if the symptoms will go away.   Get medical help at once. Call your local emergency services (911 in [place] Do not drive yourself to the hospital. This information is not intended to replace advice given to you by your health care provider. Make sure you discuss any questions you have with your health care provider. Document Released: 01/23/2005 Document Revised: 05/23/2016 Document Reviewed: 06/18/2013 Elsevier  Interactive Patient Education  2017 Elsevier Inc.     Preventing Cerebrovascular Disease Arteries are blood vessels that carry blood that contains oxygen from the heart to all parts of the body. Cerebrovascular disease affects arteries that supply the brain. Any condition that blocks or disrupts blood flow to the brain can cause cerebrovascular disease. Brain cells that lose blood supply start to die within minutes (stroke). Stroke is the main danger of cerebrovascular disease. Atherosclerosis and high blood pressure are common causes of cerebrovascular disease. Atherosclerosis is narrowing and hardening of an artery that results when fat, cholesterol, calcium, or other substances (plaque) build up inside an artery. Plaque reduces blood flow through the artery. High blood pressure increases the risk of bleeding inside the brain. Making diet and lifestyle changes to prevent atherosclerosis and high blood pressure lowers your risk of cerebrovascular disease. What nutrition changes can be made?  Eat more fruits, vegetables, and whole grains.  Reduce how much saturated fat you eat. To do this, eat less red meat and fewer full-fat dairy products.  Eat healthy proteins instead of red meat. Healthy proteins include:  Fish. Eat fish that contains heart-healthy omega-3 fatty acids, twice a week. Examples include salmon, albacore tuna, mackerel, and herring.  Chicken.  Nuts.  Low-fat or nonfat yogurt.  Avoid processed meats, like bacon and lunchmeat.  Avoid foods that contain:  A lot of sugar, such as sweets and drinks with added sugar.  A lot of salt (sodium). Avoid adding extra salt to your food, as told by your health care provider.  Trans fats, such as margarine and baked goods. Trans fats may be listed as "partially hydrogenated oils" on food labels.  Check food labels to see how much sodium, sugar, and trans fats are in foods.  Use vegetable oils that contain low amounts of  saturated fat, such as olive oil or canola oil. What lifestyle changes can be made?  Drink alcohol in moderation. This means no more than 1 drink a day for nonpregnant women and 2 drinks a day for men. One drink equals 12 oz of beer, 5 oz of wine, or 1 oz of hard liquor.  If you are overweight, ask your health care provider to recommend a weight-loss plan for you. Losing 5-10 lb (2.2-4.5 kg) can reduce your risk of diabetes, atherosclerosis, and high blood pressure.  Exercise for 30?60 minutes on most days, or as much as told by your health care provider.  Do moderate-intensity exercise, such as brisk walking, bicycling, and water aerobics. Ask your health care provider which activities are safe for you.  Do not use any products that contain nicotine or tobacco, such as cigarettes and e-cigarettes. If you need help quitting, ask your health care provider. Why are these changes important? Making these changes lowers your risk of many diseases that can cause cerebrovascular disease and stroke. Stroke is a leading cause of death and disability. Making these changes also improves your overall health and quality of life. What can I do to lower my risk? The following factors make you more likely to develop cerebrovascular disease:  Being overweight.  Smoking.  Being physically inactive.    Eating a high-fat diet.  Having certain health conditions, such as:  Diabetes.  High blood pressure.  Heart disease.  Atherosclerosis.  High cholesterol.  Sickle cell disease. Talk with your health care provider about your risk for cerebrovascular disease. Work with your health care provider to control diseases that you have that may contribute to cerebrovascular disease. Your health care provider may prescribe medicines to help prevent major causes of cerebrovascular disease. Where to find more information: Learn more about preventing cerebrovascular disease from:  [place], Lung, and  [place]: MoAnalyst.de  Centers for Disease Control and Prevention: http://www.curry-wood.biz/ Summary  Cerebrovascular disease can lead to a stroke.  Atherosclerosis and high blood pressure are major causes of cerebrovascular disease.  Making diet and lifestyle changes can reduce your risk of cerebrovascular disease.  Work with your health care provider to get your risk factors under control to reduce your risk of cerebrovascular disease. This information is not intended to replace advice given to you by your health care provider. Make sure you discuss any questions you have with your health care provider. Document Released: 12/31/2015 Document Revised: 07/05/2016 Document Reviewed: 12/31/2015 Elsevier Interactive Patient Education  2017 Elsevier Inc.     Chronic Venous Insufficiency Chronic venous insufficiency, also called venous stasis, is a condition that prevents blood from being pumped effectively through the veins in your legs. Blood may no longer be pumped effectively from the legs back to the heart. This condition can range from mild to severe. With proper treatment, you should be able to continue with an active life. What are the causes? Chronic venous insufficiency occurs when the vein walls become stretched, weakened, or damaged, or when valves within the vein are damaged. Some common causes of this include:  High blood pressure inside the veins (venous hypertension).  Increased blood pressure in the leg veins from long periods of sitting or standing.  A blood clot that blocks blood flow in a vein (deep vein thrombosis, DVT).  Inflammation of a vein (phlebitis) that causes a blood clot to form.  Tumors in the pelvis that cause blood to back up. What increases the risk? The following factors may make you more likely to develop this condition:  Having a family history of this condition.  Obesity.  Pregnancy.  Living  without enough physical activity or exercise (sedentary lifestyle).  Smoking.  Having a job that requires long periods of standing or sitting in one place.  Being a certain age. Women in their 43s and 75s and men in their 21s are more likely to develop this condition. What are the signs or symptoms? Symptoms of this condition include:  Veins that are enlarged, bulging, or twisted (varicose veins).  Skin breakdown or ulcers.  Reddened or discolored skin on the front of the leg.  Brown, smooth, tight, and painful skin just above the ankle, usually on the inside of the leg (lipodermatosclerosis).  Swelling. How is this diagnosed? This condition may be diagnosed based on:  Your medical history.  A physical exam.  Tests, such as:  A procedure that creates an image of a blood vessel and nearby organs and provides information about blood flow through the blood vessel (duplex ultrasound).  A procedure that tests blood flow (plethysmography).  A procedure to look at the veins using X-ray and dye (venogram). How is this treated? The goals of treatment are to help you return to an active life and to minimize pain or disability. Treatment depends on the severity of your  condition, and it may include:  Wearing compression stockings. These can help relieve symptoms and help prevent your condition from getting worse. However, they do not cure the condition.  Sclerotherapy. This is a procedure involving an injection of a material that "dissolves" damaged veins.  Surgery. This may involve:  Removing a diseased vein (vein stripping).  Cutting off blood flow through the vein (laser ablation surgery).  Repairing a valve. Follow these instructions at home:  Wear compression stockings as told by your health care provider. These stockings help to prevent blood clots and reduce swelling in your legs.  Take over-the-counter and prescription medicines only as told by your health care  provider.  Stay active by exercising, walking, or doing different activities. Ask your health care provider what activities are safe for you and how much exercise you need.  Drink enough fluid to keep your urine clear or pale yellow.  Do not use any products that contain nicotine or tobacco, such as cigarettes and e-cigarettes. If you need help quitting, ask your health care provider.  Keep all follow-up visits as told by your health care provider. This is important. Contact a health care provider if:  You have redness, swelling, or more pain in the affected area.  You see a red streak or line that extends up or down from the affected area.  You have skin breakdown or a loss of skin in the affected area, even if the breakdown is small.  You get an injury in the affected area. Get help right away if:  You get an injury and an open wound in the affected area.  You have severe pain that does not get better with medicine.  You have sudden numbness or weakness in the foot or ankle below the affected area, or you have trouble moving your foot or ankle.  You have a fever and you have worse or persistent symptoms.  You have chest pain.  You have shortness of breath. Summary  Chronic venous insufficiency, also called venous stasis, is a condition that prevents blood from being pumped effectively through the veins in your legs.  Chronic venous insufficiency occurs when the vein walls become stretched, weakened, or damaged, or when valves within the vein are damaged.  Treatment for this condition depends on how severe your condition is, and it may involve wearing compression stockings or having a procedure.  Make sure you stay active by exercising, walking, or doing different activities. Ask your health care provider what activities are safe for you and how much exercise you need. This information is not intended to replace advice given to you by your health care provider. Make sure you  discuss any questions you have with your health care provider. Document Released: 04/21/2007 Document Revised: 11/04/2016 Document Reviewed: 11/04/2016 Elsevier Interactive Patient Education  2017 Elsevier Inc.    To measure for knee high compression hose: Measure the length of calf, largest circumference of calf, and ankle circumference first thing in the morning before your legs have a chance to swell.  Take these 3 measurements with you to obtain 20-30 mm mercury graduated knee high compression hose.  Put the stockings on in the morning, remove at bedtime.

## 2017-03-26 NOTE — Telephone Encounter (Signed)
SPOKE TO PATIENT .  APPOINTMENT SCHEDULED FOR NURSE VISIT  3/29 / 10 :30 AM  FOR EKG   PATIENT AWARE WILL REVIEW WITH DOCTOR OF THE DAY

## 2017-03-26 NOTE — Telephone Encounter (Signed)
Patient calling states that he saw Sharmon Leyden Nickel, NP for a carotid appt and she found that he had an irregular heartbeat. She recommended that he call our office to see if he should have an EKG. Patient states that he will only be in [place] today and tomorrow (03-27-17). Please call to discuss, thanks.

## 2017-03-26 NOTE — Progress Notes (Signed)
Chief Complaint: Follow up Extracranial Carotid Artery Stenosis   History of Present Illness  [name] is a 76 y.o. male patient of Dr. Scot Dock who is s/p left CEA on 08/23/2011. He returns today for follow up.  He has a hx of hemorrhagic stroke about 2000 as manifested by leaning to the left, resolved in a week, no further stroke or TIA events.He denies a history of amaurosis fugax or monocular blindness, unilateral facial drooping, hemiplegia, or receptive or expressive aphasia.   He denies claudication symptoms with walking; he uses a cane to walk due to back feeling weak.  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 He has dyspnea, states this is baseline for him, but has improved with more walking.  He was hospitalized at [facility] with CAP, sepsis, and leukocytosis in September 2016.  He was evaluated in [facility] ED, close to [place], about the second week of March 2018 for dyspnea, states he was thoroughly evaluated inclduing an ECG which ot states was normal other than CHF, pt states no arrhythmia.   His wife died in 12/16/2016.  Pt Diabetic: Yes, 7.6 on 03-26-17 (review of records) Pt smoker: non-smoker, but exposed to father's secondhand smoke as a child   Pt meds include: Statin : Yes ASA: Yes Other anticoagulants/antiplatelets: Brilinta, since 2 cardiac stents placed 08-28-16 by Dr. Claiborne Billings   Past Medical History:  Diagnosis Date  . Acute diastolic congestive heart failure ([place]) 09/20/2015  . Arthritis    "fingers" (08/27/2016)  . CAD (coronary artery disease)    a. 06/2007 PCI LAD->Cypher DES;  b. 02/2010 Cath LM 30-40, LAD 40-42m, patent stent, D2 50, RI 70 ost, LCX 50, OM 30, RCA 30-81m, 30d, PDA 50, RPL 40-50p, nl EF.  . Carotid artery occlusion    a. 07/2011 L CEA;  b. 02/2012 Carotid U/S: RICA 1-47%, LICA patent.  . Chronic back pain    "moves up and down my back" (08/27/2016)  . Chronic bronchitis ([place])   . CVA (cerebral vascular  accident) ([place]) 2001, 2004   "I've only had 1; hemorrhagic; don't remember specifically when" (08/27/2016)  . GERD (gastroesophageal reflux disease)   . Heart murmur   . History of blood transfusion    "related to a surgery I think"  . Hyperlipidemia   . Hypertension   . Kidney stones   . OSA on CPAP 07/20/2015  . Peripheral vascular disease ([place]) 2001  . Pneumonia    "a number of times" (08/27/2016)  . Type II diabetes mellitus ([place])     Social History Social History  Substance Use Topics  . Smoking status: Never Smoker  . Smokeless tobacco: Never Used  . Alcohol use No    Family History Family History  Problem Relation Age of Onset  . Heart disease Mother     CHF Heart Disease before age 68  . Heart disease Father     Heart Disease before age 22  . Cancer Brother     Lung  . Cancer Brother     Lung  . COPD Brother     Surgical History Past Surgical History:  Procedure Laterality Date  . CARDIAC CATHETERIZATION  02/2010; 08/27/2016   "no stent"/notes 03/28/2010; unable to put stent in cause of spasms"  . CARDIAC CATHETERIZATION N/A 08/27/2016   Procedure: Left Heart Cath and Coronary Angiography;  Surgeon: Troy Sine, MD;  Location: [place] [facility];  Service: Cardiovascular;  Laterality: N/A;  .  CARDIAC CATHETERIZATION N/A 08/28/2016   Procedure: Coronary Stent Intervention;  Surgeon: Troy Sine, MD;  Location: [place] [facility];  Service: Cardiovascular;  Laterality: N/A;  . CAROTID ENDARTERECTOMY Left 08/23/2011  . CORONARY ANGIOPLASTY WITH STENT PLACEMENT  06-30-07  . CYSTOSCOPY W/ STONE MANIPULATION    . INGUINAL HERNIA REPAIR Right 1962  . [place]   "cut me open"    Allergies  Allergen Reactions  . Clopidogrel Bisulfate Itching and Rash    REACTION: rash, itching from Head to Toe   -  PLAVIX   . Adhesive [Tape] Rash    rash    Current Outpatient Prescriptions  Medication Sig Dispense Refill  . albuterol (PROVENTIL  HFA;VENTOLIN HFA) 108 (90 Base) MCG/ACT inhaler Inhale 2 puffs into the lungs every 6 (six) hours as needed for wheezing or shortness of breath. 1 Inhaler 0  . amLODipine (NORVASC) 10 MG tablet TAKE 1 TABLET (10 MG TOTAL) BY MOUTH DAILY. 90 tablet 0  . aspirin EC 81 MG tablet Take 1 tablet (81 mg total) by mouth daily.    [name] atenolol (TENORMIN) 100 MG tablet TAKE 1 TABLET (100 MG TOTAL) BY MOUTH 2 (TWO) TIMES DAILY. 200 tablet 0  . atorvastatin (LIPITOR) 80 MG tablet Take 1 tablet (80 mg total) by mouth daily at 6 PM. 30 tablet 12  . benzonatate (TESSALON) 200 MG capsule Take 1 capsule (200 mg total) by mouth 2 (two) times daily as needed for cough. 20 capsule 1  . furosemide (LASIX) 20 MG tablet Take 1 tablet (20 mg total) by mouth daily. 90 tablet 3  . furosemide (LASIX) 40 MG tablet TAKE 1 TABLET BY MOUTH IN THE AM, 1/2 TABLET IN THE EVENING AS DIRECTED *TIME FOR AN OFFICE VISIT* 90 tablet 3  . glipiZIDE (GLUCOTROL) 10 MG tablet TAKE 1 TABLET (10 MG TOTAL) BY MOUTH 2 (TWO) TIMES DAILY BEFORE A MEAL. 200 tablet 2  . glucose blood (ONE TOUCH ULTRA TEST) test strip 1 each by Other route 3 (three) times daily. Use as instructed 270 each 3  . insulin lispro protamine-lispro (HUMALOG 75/25 MIX) (75-25) 100 UNIT/ML SUSP injection Inject 25 Units into the skin daily with supper. 10 mL 3  . lisinopril (PRINIVIL,ZESTRIL) 40 MG tablet Take 1 tablet (40 mg total) by mouth daily. 100 tablet 3  . metFORMIN (GLUCOPHAGE) 1000 MG tablet TAKE 1 TABLET BY MOUTH TWICE DAILY WITH MEALS 180 tablet 1  . nitroGLYCERIN (NITROSTAT) 0.4 MG SL tablet Place 1 tablet (0.4 mg total) under the tongue every 5 (five) minutes as needed for chest pain. 25 tablet 2  . pantoprazole (PROTONIX) 40 MG tablet Take 1 tablet (40 mg total) by mouth daily. 30 tablet 12  . spironolactone (ALDACTONE) 25 MG tablet Take 1 tablet (25 mg total) by mouth daily. 90 tablet 3  . ticagrelor (BRILINTA) 90 MG TABS tablet Take 1 tablet (90 mg total) by  mouth 2 (two) times daily. 60 tablet 11   Current Facility-Administered Medications  Medication Dose Route Frequency Provider Last Rate Last Dose  . ipratropium-albuterol (DUONEB) 0.5-2.5 (3) MG/3ML nebulizer solution 3 mL  3 mL Nebulization Once Dorothyann Peng, NP        Review of Systems : See HPI for pertinent positives and negatives.  Physical Examination  Vitals:   03/26/17 1338 03/26/17 1342  BP: 127/64 128/64  Pulse: 87   Resp: 20   Temp: 97.6 F (36.4 C)   TempSrc: Oral  SpO2: 98%   Weight: 248 lb (112.5 kg)   Height: 5' 9.5" (1.765 m)    Body mass index is 36.1 kg/m.  General: WDWN obese male in NAD GAIT: normal Eyes: PERRLA Pulmonary: Respirations are non-labored, CTAB Cardiac: Irregular rhythm, no detected murmur.  VASCULAR EXAM Carotid Bruits Left Right   Negative Negative   Radial pulses are 2+ palpable and equal.      LE Pulses LEFT RIGHT   POPLITEAL not palpable  not palpable           PT  2+ palpable  not palpable           DP 1+ palpable   2+ palpable     Gastrointestinal: soft, nontender, BS WNL, no r/g,no palpable masses.  Musculoskeletal: No muscle atrophy/wasting. M/S 5/5 throughout, Extremities without ischemic changes. 2+ bilateral pretibial pitting edema, no venous stasis changes.  Neurologic: A&O X 3; Appropriate Affect, sensation is normal, Speech is normal CN 2-12 intact, Pain and light touch intact in extremities, Motor exam as listed above    Assessment: [name] is a 76 y.o. male who is s/p left CEA on 08/23/2011. He had a hemorrhagic stroke in 2000 with resolution of symptoms in a week, no strokes or TIA's since then.  He reports no known history of cardiac arrhythmia, but has an irregular cardiac rhythm now which is asymptomatic; he denies  feeling light headed, denies chest pain, states his dyspnea has improved since he has been exercising.  He reports being evaluated for dyspnea at an ED in [place], [place] (close to [place]), about 2 weeks ago. He states a thorough evaluation was performed including ECG which pt states did not show a problem other than his known CHF. I advised pt to call Dr. Jacalyn Lefevre office and request guidance re this.    DATA (03/26/17):  Carotid Duplex suggests <40% right ICA stenosis. Left ICA: CEA site with no restenosis. Bilateral vertebral artery flow is antegrade.  Bilateral subclavian artery waveforms are normal.  No significant change compared to last exam on 03/20/16.   Venous insufficiency with 1-2+ pitting edema in ankles/feet/lower legs: information about proper fitting and use of graduated knee high compression hose given.  Printed information about ETI given at previous visit.    Plan:  Follow-up in 18 months with Carotid Duplex scan.  I discussed in depth with the patient the nature of atherosclerosis, and emphasized the importance of maximal medical management including strict control of blood pressure, blood glucose, and lipid levels, obtaining regular exercise, and continued cessation of smoking.  The patient is aware that without maximal medical management the underlying atherosclerotic disease process will progress, limiting the benefit of any interventions. The patient was given information about stroke prevention and what symptoms should prompt the patient to seek immediate medical care. Thank you for allowing [place] to participate in this patient's care.  Clemon Chambers, RN, MSN, FNP-C [facility] Office: [phone]  Clinic Physician: Scot Dock  03/26/17 1:45 PM

## 2017-03-26 NOTE — Progress Notes (Signed)
Subjective:    Patient ID: [name], male    DOB: 04-25-1941, 76 y.o.   MRN: [id number]  HPI  76 year old male who  has a past medical history of Acute diastolic congestive heart failure ([place]) (09/20/2015); Arthritis; CAD (coronary artery disease); Carotid artery occlusion; Chronic back pain; Chronic bronchitis (HCC); CVA (cerebral vascular accident) ([place]) (2001, 2004); GERD (gastroesophageal reflux disease); Heart murmur; History of blood transfusion; Hyperlipidemia; Hypertension; Kidney stones; OSA on CPAP (07/20/2015); Peripheral vascular disease ([place]) (2001); Pneumonia; and Type II diabetes mellitus ([place]).  He presents to the clinic today for the concern of recent hypoglycemia. [name] reports that 3 days ago, in the early morning his son heard him screaming in his room, his son was unable to arouse him so EMS was called. Once EMS got to the house they found that his blood sugar was 48 he was given intravenous dextrose and blood sugar immediately returned to normal. That evening he took his normal dose of insulin.  He woke up about 3:30 AM the next morning and his blood sugar was 100. He ate a piece of candy and went back to bed. When he woke up, his blood sugar was in the mid 100's. He did not take his insulin that evening.   Yesterday evening his blood sugar was again low, for him at 98. He again did not take his insulin   This morning when he woke up and checked his fasting glucose it was 226.   [name] reports that since he moved in with his son in [place] that he is been walking more and becoming more active. He also reports that he sometimes does not eat dinner until 9 PM due to his son's busy work life. His meals have been becoming on consistent throughout the day and into the evening.   Review of Systems See HPI   Past Medical History:  Diagnosis Date  . Acute diastolic congestive heart failure ([place]) 09/20/2015  . Arthritis    "fingers" (08/27/2016)  . CAD  (coronary artery disease)    a. 06/2007 PCI LAD->Cypher DES;  b. 02/2010 Cath LM 30-40, LAD 40-2m, patent stent, D2 50, RI 70 ost, LCX 50, OM 30, RCA 30-81m, 30d, PDA 50, RPL 40-50p, nl EF.  . Carotid artery occlusion    a. 07/2011 L CEA;  b. 02/2012 Carotid U/S: RICA 7-42%, LICA patent.  . Chronic back pain    "moves up and down my back" (08/27/2016)  . Chronic bronchitis ([place])   . CVA (cerebral vascular accident) ([place]) 2001, 2004   "I've only had 1; hemorrhagic; don't remember specifically when" (08/27/2016)  . GERD (gastroesophageal reflux disease)   . Heart murmur   . History of blood transfusion    "related to a surgery I think"  . Hyperlipidemia   . Hypertension   . Kidney stones   . OSA on CPAP 07/20/2015  . Peripheral vascular disease ([place]) 2001  . Pneumonia    "a number of times" (08/27/2016)  . Type II diabetes mellitus ([place])     Social History   Social History  . Marital status: Married    Spouse name: N/A  . Number of children: N/A  . Years of education: N/A   Occupational History  . Medical illustrator    Social History Main Topics  . Smoking status: Never Smoker  . Smokeless tobacco: Never Used  . Alcohol use No  . Drug use: No  . Sexual activity: Not Currently  Other Topics Concern  . Not on file   Social History Narrative   Works part time as an Medical illustrator.   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    One son who lives in [place] - son is adopted.    She takes care of his wife, she is oxygen continuously.     Past Surgical History:  Procedure Laterality Date  . CARDIAC CATHETERIZATION  02/2010; 08/27/2016   "no stent"/notes 03/28/2010; unable to put stent in cause of spasms"  . CARDIAC CATHETERIZATION N/A 08/27/2016   Procedure: Left Heart Cath and Coronary Angiography;  Surgeon: Troy Sine, MD;  Location: [place] [facility];  Service: Cardiovascular;  Laterality: N/A;  . CARDIAC CATHETERIZATION N/A 08/28/2016   Procedure: Coronary Stent Intervention;  Surgeon:  Troy Sine, MD;  Location: [place] [facility];  Service: Cardiovascular;  Laterality: N/A;  . CAROTID ENDARTERECTOMY Left 08/23/2011  . CORONARY ANGIOPLASTY WITH STENT PLACEMENT  06-30-07  . CYSTOSCOPY W/ STONE MANIPULATION    . INGUINAL HERNIA REPAIR Right 1962  . [place]   "cut me open"    Family History  Problem Relation Age of Onset  . Heart disease Mother     CHF Heart Disease before age 65  . Heart disease Father     Heart Disease before age 105  . Cancer Brother     Lung  . Cancer Brother     Lung  . COPD Brother     Allergies  Allergen Reactions  . Clopidogrel Bisulfate Itching and Rash    REACTION: rash, itching from Head to Toe   -  PLAVIX   . Adhesive [Tape] Rash    rash    Current Outpatient Prescriptions on File Prior to Visit  Medication Sig Dispense Refill  . albuterol (PROVENTIL HFA;VENTOLIN HFA) 108 (90 Base) MCG/ACT inhaler Inhale 2 puffs into the lungs every 6 (six) hours as needed for wheezing or shortness of breath. 1 Inhaler 0  . amLODipine (NORVASC) 10 MG tablet TAKE 1 TABLET (10 MG TOTAL) BY MOUTH DAILY. 90 tablet 0  . aspirin EC 81 MG tablet Take 1 tablet (81 mg total) by mouth daily.    [name] atenolol (TENORMIN) 100 MG tablet TAKE 1 TABLET (100 MG TOTAL) BY MOUTH 2 (TWO) TIMES DAILY. 200 tablet 0  . atorvastatin (LIPITOR) 80 MG tablet Take 1 tablet (80 mg total) by mouth daily at 6 PM. 30 tablet 12  . benzonatate (TESSALON) 200 MG capsule Take 1 capsule (200 mg total) by mouth 2 (two) times daily as needed for cough. 20 capsule 1  . furosemide (LASIX) 20 MG tablet Take 1 tablet (20 mg total) by mouth daily. 90 tablet 3  . furosemide (LASIX) 40 MG tablet TAKE 1 TABLET BY MOUTH IN THE AM, 1/2 TABLET IN THE EVENING AS DIRECTED *TIME FOR AN OFFICE VISIT* 90 tablet 3  . glipiZIDE (GLUCOTROL) 10 MG tablet TAKE 1 TABLET (10 MG TOTAL) BY MOUTH 2 (TWO) TIMES DAILY BEFORE A MEAL. 200 tablet 2  . insulin lispro protamine-lispro (HUMALOG 75/25  MIX) (75-25) 100 UNIT/ML SUSP injection Inject 25 Units into the skin daily with supper. 10 mL 3  . lisinopril (PRINIVIL,ZESTRIL) 40 MG tablet Take 1 tablet (40 mg total) by mouth daily. 100 tablet 3  . metFORMIN (GLUCOPHAGE) 1000 MG tablet TAKE 1 TABLET BY MOUTH TWICE DAILY WITH MEALS 180 tablet 1  . nitroGLYCERIN (NITROSTAT) 0.4 MG SL tablet Place 1 tablet (0.4 mg total)  under the tongue every 5 (five) minutes as needed for chest pain. 25 tablet 2  . pantoprazole (PROTONIX) 40 MG tablet Take 1 tablet (40 mg total) by mouth daily. 30 tablet 12  . spironolactone (ALDACTONE) 25 MG tablet Take 1 tablet (25 mg total) by mouth daily. 90 tablet 3  . ticagrelor (BRILINTA) 90 MG TABS tablet Take 1 tablet (90 mg total) by mouth 2 (two) times daily. 60 tablet 11   Current Facility-Administered Medications on File Prior to Visit  Medication Dose Route Frequency Provider Last Rate Last Dose  . ipratropium-albuterol (DUONEB) 0.5-2.5 (3) MG/3ML nebulizer solution 3 mL  3 mL Nebulization Once [employer], NP        BP 122/68 (BP Location: Left Arm, Patient Position: Sitting, Cuff Size: Normal)   Temp 97.4 F (36.3 C) (Oral)   Ht 5' 9.5" (1.765 m)   Wt 246 lb 8 oz (111.8 kg)   BMI 35.88 kg/m       Objective:   Physical Exam  Constitutional: He is oriented to person, place, and time. He appears well-developed and well-nourished. No distress.  Cardiovascular: Normal rate, regular rhythm, normal heart sounds and intact distal pulses.  Exam reveals no gallop and no friction rub.   No murmur heard. Pulmonary/Chest: Effort normal and breath sounds normal. No respiratory distress. He has no wheezes. He has no rales. He exhibits no tenderness.  Abdominal: Soft. Bowel sounds are normal. He exhibits no distension and no mass. There is no tenderness. There is no rebound and no guarding.  Neurological: He is alert and oriented to person, place, and time.  Skin: Skin is warm and dry. No rash noted. He is not  diaphoretic. No erythema. No pallor.  Psychiatric: He has a normal mood and affect. His behavior is normal. Judgment and thought content normal.  Nursing note and vitals reviewed.     Assessment & Plan:  1. Type 2 diabetes mellitus without complication, with long-term current use of insulin (HCC) - POC HgB A1c - 7.6. Has come down from 8.1  - Going to decrease her Humalog 7525 from 25 units in the evening to 20 units in the evening. He was advised to take this route before dinner and make sure that he is more consistent with his eating patterns. He can titrate up 1 unit every 3 days until his blood sugars are more stable. - Continue to stay active and drink plenty of water - He is following up with a new primary care provider in [place], [place] in a couple weeks -Follow-up with me for any reason until that time  Dorothyann Peng, NP

## 2017-03-27 ENCOUNTER — Encounter: Payer: Self-pay | Admitting: Cardiology

## 2017-03-27 ENCOUNTER — Ambulatory Visit (INDEPENDENT_AMBULATORY_CARE_PROVIDER_SITE_OTHER): Payer: Medicare Other | Admitting: Cardiology

## 2017-03-27 VITALS — BP 134/66 | HR 84 | Ht 69.5 in | Wt 246.0 lb

## 2017-03-27 DIAGNOSIS — I499 Cardiac arrhythmia, unspecified: Secondary | ICD-10-CM

## 2017-03-27 DIAGNOSIS — G4733 Obstructive sleep apnea (adult) (pediatric): Secondary | ICD-10-CM

## 2017-03-27 DIAGNOSIS — I4891 Unspecified atrial fibrillation: Secondary | ICD-10-CM

## 2017-03-27 DIAGNOSIS — Z888 Allergy status to other drugs, medicaments and biological substances status: Secondary | ICD-10-CM

## 2017-03-27 DIAGNOSIS — Z8673 Personal history of transient ischemic attack (TIA), and cerebral infarction without residual deficits: Secondary | ICD-10-CM

## 2017-03-27 DIAGNOSIS — I10 Essential (primary) hypertension: Secondary | ICD-10-CM

## 2017-03-27 DIAGNOSIS — I6523 Occlusion and stenosis of bilateral carotid arteries: Secondary | ICD-10-CM

## 2017-03-27 DIAGNOSIS — Z9861 Coronary angioplasty status: Secondary | ICD-10-CM

## 2017-03-27 DIAGNOSIS — I2510 Atherosclerotic heart disease of native coronary artery without angina pectoris: Secondary | ICD-10-CM

## 2017-03-27 DIAGNOSIS — I6529 Occlusion and stenosis of unspecified carotid artery: Secondary | ICD-10-CM

## 2017-03-27 DIAGNOSIS — R0600 Dyspnea, unspecified: Secondary | ICD-10-CM

## 2017-03-27 NOTE — Assessment & Plan Note (Signed)
LDL was 40 Oct 2017

## 2017-03-27 NOTE — Assessment & Plan Note (Signed)
S/P Lt CEA Aug 2012

## 2017-03-27 NOTE — Assessment & Plan Note (Signed)
Compliant with C-pap 

## 2017-03-27 NOTE — Assessment & Plan Note (Signed)
ICH Nov 2003. No significant residual

## 2017-03-27 NOTE — Assessment & Plan Note (Signed)
Controlled.  

## 2017-03-27 NOTE — Progress Notes (Signed)
1.) Reason for visit: patient called into office - per office vist with  [facility] - Nickel NP -IRREGULAR  HEARTBEAT  2.) H&P VITAL SIGN TAKEN   3.) ROS related to problem  EKG DONE , REVIEWED  With  Kerin Ransom PA   4.) Assessment and plan per MD-- PATIENT  MADE AWARE . WILL NEED TO SEE KILROY FOR OFFICE APPOINTMENT- DUE  TO EKG READINGS.

## 2017-03-27 NOTE — Assessment & Plan Note (Signed)
Recent complaint of dyspnea possibly related to new onset AF, but pt says this is improving.

## 2017-03-27 NOTE — Patient Instructions (Addendum)
Medication Instructions:  Your physician recommends that you continue on your current medications as directed. Please refer to the Current Medication list given to you today.  Labwork: None   Testing/Procedures: None   Follow-Up: Your physician recommends that you schedule a follow-up appointment in: 4 -6 weeks with Dr Stanford Breed  Any Other Special Instructions Will Be Listed Below (If Applicable).  Someone from our office will call you later on this afternoon with instructions for you   If you need a refill on your cardiac medications before your next appointment, please call your pharmacy.    DETERMINED AFTER VISIT 1. Per Lurena Joiner I contact patient and told patient to continue taking Aspirin and Brilinta, and that we are not starting any   Xarelto or Warfarin. And that it is too much of a risk to stop Brilinta. 2. He needs to have a ECHO, order put in and a scheduler will contact him to schedule it  3.  He needs labs BMP, Potassium and TSH-orders have been put in and patient said he would do them at the   [address] office Patient voiced understanding

## 2017-03-27 NOTE — Progress Notes (Signed)
03/27/2017 [name]   11/12/1941  [id number]  Primary Physician Jacob Peng, NP Primary Cardiologist: Dr [name]  HPI:  76 y/o obese male, followed by Dr [name] with a history of CAD, PVD, HLD, OSA, and HTN. The pt had an LAD PCI with DES in 2008. He has a Plavix allergy- rash and itching. In Aug 2017 he had a complex RCA PCI with DES and it was decided to treat him with Brilinta and ASA (pt has a history of hemorrhagic CVA in 2003). He has noticed some DOE in the past few weeks. He recently went to an ED in [place] (he will be moving there to live with his son). He was told his heart was OK and a CT scan of his chest did not show "a blood clot". He was at his vascular surgeon's office today when Jacob Corwin NP noted he had an irregular heart rate. He was sent here and added on to my schedule. He is in AF-new for him. His VR is in the 80's and he is unaware he is in AF. He says his dyspnea has gotten better over the past week with increased walking.    Current Outpatient Prescriptions  Medication Sig Dispense Refill  . albuterol (PROVENTIL HFA;VENTOLIN HFA) 108 (90 Base) MCG/ACT inhaler Inhale 2 puffs into the lungs every 6 (six) hours as needed for wheezing or shortness of breath. 1 Inhaler 0  . amLODipine (NORVASC) 10 MG tablet TAKE 1 TABLET (10 MG TOTAL) BY MOUTH DAILY. 90 tablet 0  . aspirin EC 81 MG tablet Take 1 tablet (81 mg total) by mouth daily.    [name] atenolol (TENORMIN) 100 MG tablet TAKE 1 TABLET (100 MG TOTAL) BY MOUTH 2 (TWO) TIMES DAILY. 200 tablet 0  . atorvastatin (LIPITOR) 80 MG tablet Take 1 tablet (80 mg total) by mouth daily at 6 PM. 30 tablet 12  . benzonatate (TESSALON) 200 MG capsule Take 1 capsule (200 mg total) by mouth 2 (two) times daily as needed for cough. 20 capsule 1  . furosemide (LASIX) 20 MG tablet Take 1 tablet (20 mg total) by mouth daily. 90 tablet 3  . furosemide (LASIX) 40 MG tablet TAKE 1 TABLET BY MOUTH IN THE AM, 1/2 TABLET IN THE  EVENING AS DIRECTED *TIME FOR AN OFFICE VISIT* 90 tablet 3  . glipiZIDE (GLUCOTROL) 10 MG tablet TAKE 1 TABLET (10 MG TOTAL) BY MOUTH 2 (TWO) TIMES DAILY BEFORE A MEAL. 200 tablet 2  . glucose blood (ONE TOUCH ULTRA TEST) test strip 1 each by Other route 3 (three) times daily. Use as instructed 270 each 3  . insulin lispro protamine-lispro (HUMALOG 75/25 MIX) (75-25) 100 UNIT/ML SUSP injection Inject 25 Units into the skin daily with supper. 10 mL 3  . lisinopril (PRINIVIL,ZESTRIL) 40 MG tablet Take 1 tablet (40 mg total) by mouth daily. 100 tablet 3  . metFORMIN (GLUCOPHAGE) 1000 MG tablet TAKE 1 TABLET BY MOUTH TWICE DAILY WITH MEALS 180 tablet 1  . nitroGLYCERIN (NITROSTAT) 0.4 MG SL tablet Place 1 tablet (0.4 mg total) under the tongue every 5 (five) minutes as needed for chest pain. 25 tablet 2  . pantoprazole (PROTONIX) 40 MG tablet Take 1 tablet (40 mg total) by mouth daily. 30 tablet 12  . spironolactone (ALDACTONE) 25 MG tablet Take 1 tablet (25 mg total) by mouth daily. 90 tablet 3  . ticagrelor (BRILINTA) 90 MG TABS tablet Take 1 tablet (90 mg total) by mouth 2 (  two) times daily. 60 tablet 11   Current Facility-Administered Medications  Medication Dose Route Frequency Provider Last Rate Last Dose  . ipratropium-albuterol (DUONEB) 0.5-2.5 (3) MG/3ML nebulizer solution 3 mL  3 mL Nebulization Once Jacob Peng, NP        Allergies  Allergen Reactions  . Clopidogrel Bisulfate Itching and Rash    REACTION: rash, itching from Head to Toe   -  PLAVIX   . Adhesive [Tape] Rash    rash    Past Medical History:  Diagnosis Date  . Acute diastolic congestive heart failure ([place]) 09/20/2015  . Arthritis    "fingers" (08/27/2016)  . CAD (coronary artery disease)    a. 06/2007 PCI LAD->Cypher DES;  b. 02/2010 Cath LM 30-40, LAD 40-33m, patent stent, D2 50, RI 70 ost, LCX 50, OM 30, RCA 30-86m, 30d, PDA 50, RPL 40-50p, nl EF.  . Carotid artery occlusion    a. 07/2011 L CEA;  b. 02/2012 Carotid  U/S: RICA 1-02%, LICA patent.  . Chronic back pain    "moves up and down my back" (08/27/2016)  . Chronic bronchitis ([place])   . CVA (cerebral vascular accident) ([place]) 2001, 2004   "I've only had 1; hemorrhagic; don't remember specifically when" (08/27/2016)  . GERD (gastroesophageal reflux disease)   . Heart murmur   . History of blood transfusion    "related to a surgery I think"  . Hyperlipidemia   . Hypertension   . Kidney stones   . OSA on CPAP 07/20/2015  . Peripheral vascular disease ([place]) 2001  . Pneumonia    "a number of times" (08/27/2016)  . Type II diabetes mellitus ([place])     Social History   Social History  . Marital status: Married    Spouse name: N/A  . Number of children: N/A  . Years of education: N/A   Occupational History  . Medical illustrator    Social History Main Topics  . Smoking status: Never Smoker  . Smokeless tobacco: Never Used  . Alcohol use No  . Drug use: No  . Sexual activity: Not Currently   Other Topics Concern  . Not on file   Social History Narrative   Works part time as an Medical illustrator.   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    One son who lives in [place] - son is adopted.    She takes care of his wife, she is oxygen continuously.      Family History  Problem Relation Age of Onset  . Heart disease Mother     CHF Heart Disease before age 75  . Heart disease Father     Heart Disease before age 45  . Cancer Brother     Lung  . Cancer Brother     Lung  . COPD Brother      Review of Systems: General: negative for chills, fever, night sweats or weight changes.  Cardiovascular: negative for chest pain, dyspnea on exertion, edema, orthopnea, palpitations, paroxysmal nocturnal dyspnea or shortness of breath Dermatological: negative for rash Respiratory: negative for cough or wheezing Urologic: negative for hematuria Abdominal: negative for nausea, vomiting, diarrhea, bright red blood per rectum, melena, or hematemesis Neurologic: negative  for visual changes, syncope, or dizziness All other systems reviewed and are otherwise negative except as noted above.    Blood pressure 120/60, pulse 82, height 5' 9.5" (1.765 m), weight 244 lb (110.7 kg).  General appearance: alert, cooperative, no distress and morbidly obese Neck: no JVD Lungs:  clear to auscultation bilaterally Heart: irregularly irregular rhythm Abdomen: truncal obesity Extremities: extremities normal, atraumatic, no cyanosis or edema Skin: Skin color, texture, turgor normal. No rashes or lesions Neurologic: Grossly normal  EKG AF LAD HR-87  ASSESSMENT AND PLAN:   Atrial fibrillation, new onset ([place]) Pt found to be in atrial fibrillation on routine office visit at his vascular surgeon's office. Rate controlled. Recent two-three week history of dyspnea, seen at an outside ED a week ago.  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 No significant residual  OSA (obstructive sleep apnea) Compliant with C-pap  Occlusion and stenosis of carotid artery S/P Lt CEA Aug 2012  Dyslipidemia LDL was 40 Oct 2017  Essential hypertension Controlled  Dyspnea Recent complaint of dyspnea possibly related to new onset AF, but pt says this is improving.   Allergy to Plavix-rash See Dr Evette Georges PCI note Aug 2017- Effient not an option with history of hemorrhagic stroke. Pt has been on Plavix and ASA 81 mg.    PLAN  Discussed with Dr Oval Linsey. The pt is a CHADs2 Vasc 6 but has a Plavix allergy and had DES coronary stents placed in Aug 2017. Continue ASA and Brilinta for now, consider stopping Brilinta and adding Coumadin Aug 2018. Will check echo for LA size and LVF, electrolytes,  and a TSH.  He has a follow up with Dr [name] next month.   Kerin Ransom PA-C 03/27/2017 1:38 PM

## 2017-03-27 NOTE — Assessment & Plan Note (Addendum)
Pt found to be in atrial fibrillation on routine office visit at his vascular surgeon's office. Rate controlled. Recent two-three week history of dyspnea, seen at an outside ED a week ago.  CHADs2 VASC=6

## 2017-03-27 NOTE — Assessment & Plan Note (Signed)
LAD PCI, '08, RCA PCI with DES 08/28/17

## 2017-03-27 NOTE — Addendum Note (Signed)
Addended by: Lianne Cure A on: 03/27/2017 09:50 AM   Modules accepted: Orders

## 2017-03-27 NOTE — Assessment & Plan Note (Signed)
See Dr Evette Georges PCI note Aug 2017- Effient not an option with history of hemorrhagic stroke. Pt has been on Plavix and ASA 81 mg.

## 2017-03-28 NOTE — Telephone Encounter (Signed)
Late entry patient was seen 03/27/17  ekg obtained showed afib   luke kilroy saw patient as an add-on

## 2017-04-07 ENCOUNTER — Encounter: Payer: Self-pay | Admitting: Cardiology

## 2017-04-11 ENCOUNTER — Other Ambulatory Visit (HOSPITAL_COMMUNITY): Payer: Medicare Other

## 2017-04-14 DIAGNOSIS — E119 Type 2 diabetes mellitus without complications: Secondary | ICD-10-CM | POA: Diagnosis not present

## 2017-04-14 DIAGNOSIS — E785 Hyperlipidemia, unspecified: Secondary | ICD-10-CM | POA: Diagnosis not present

## 2017-04-14 DIAGNOSIS — Z794 Long term (current) use of insulin: Secondary | ICD-10-CM | POA: Diagnosis not present

## 2017-04-14 DIAGNOSIS — I4891 Unspecified atrial fibrillation: Secondary | ICD-10-CM | POA: Diagnosis not present

## 2017-04-14 NOTE — Progress Notes (Signed)
HPI: FU CAD and atrial fibrillation; s/p Cypher DES to the LAD 7/08. Also with chronic edema and cerebrovascular disease (followed by vascular surgery). Abd ultrasound 5/16 showed no aneurysm. Echo 9/16 showed normal LV function, mild LAE. Cath 8/17 showed 85 mid RCA, 90 distal RCA, 85 RPLB. Pt had 2 DES. Echocardiogram April 2018 showed normal LV function, severe left atrial enlargement. Carotid Dopplers March 2018 showed 1-39% right and patent left carotid artery. Patient recently seen by Kerin Ransom and found to be in new-onset atrial fibrillation. ASA and brilinta continued (coumadin not added due to h/o ICH and need for dual antiplt therapy). Note his intracranial hemorrhage was a cerebellar hematoma in November 2003. It did not require evacuation.  Since last seen, patient has mild dyspnea on exertion but no orthopnea, PND, chest pain, palpitations or syncope. Mild chronic pedal edema.  Current Outpatient Prescriptions  Medication Sig Dispense Refill  . albuterol (PROVENTIL HFA;VENTOLIN HFA) 108 (90 Base) MCG/ACT inhaler Inhale 2 puffs into the lungs every 6 (six) hours as needed for wheezing or shortness of breath. 1 Inhaler 0  . amLODipine (NORVASC) 10 MG tablet TAKE 1 TABLET (10 MG TOTAL) BY MOUTH DAILY. 90 tablet 0  . aspirin EC 81 MG tablet Take 1 tablet (81 mg total) by mouth daily.    [name] atenolol (TENORMIN) 100 MG tablet TAKE 1 TABLET (100 MG TOTAL) BY MOUTH 2 (TWO) TIMES DAILY. 200 tablet 0  . atorvastatin (LIPITOR) 80 MG tablet Take 1 tablet (80 mg total) by mouth daily at 6 PM. 30 tablet 12  . benzonatate (TESSALON) 200 MG capsule Take 1 capsule (200 mg total) by mouth 2 (two) times daily as needed for cough. 20 capsule 1  . furosemide (LASIX) 20 MG tablet Take 1 tablet (20 mg total) by mouth daily. 90 tablet 3  . furosemide (LASIX) 40 MG tablet TAKE 1 TABLET BY MOUTH IN THE AM, 1/2 TABLET IN THE EVENING AS DIRECTED *TIME FOR AN OFFICE VISIT* 90 tablet 3  . glipiZIDE (GLUCOTROL)  10 MG tablet TAKE 1 TABLET (10 MG TOTAL) BY MOUTH 2 (TWO) TIMES DAILY BEFORE A MEAL. 200 tablet 2  . glucose blood (ONE TOUCH ULTRA TEST) test strip 1 each by Other route 3 (three) times daily. Use as instructed 270 each 3  . insulin lispro protamine-lispro (HUMALOG 75/25 MIX) (75-25) 100 UNIT/ML SUSP injection Inject 25 Units into the skin daily with supper. 10 mL 3  . lisinopril (PRINIVIL,ZESTRIL) 40 MG tablet TAKE 1 TABLET (40 MG TOTAL) BY MOUTH DAILY. 30 tablet 0  . metFORMIN (GLUCOPHAGE) 1000 MG tablet TAKE 1 TABLET BY MOUTH TWICE DAILY WITH MEALS 180 tablet 1  . nitroGLYCERIN (NITROSTAT) 0.4 MG SL tablet Place 1 tablet (0.4 mg total) under the tongue every 5 (five) minutes as needed for chest pain. 25 tablet 2  . pantoprazole (PROTONIX) 40 MG tablet Take 1 tablet (40 mg total) by mouth daily. 30 tablet 12  . spironolactone (ALDACTONE) 25 MG tablet Take 1 tablet (25 mg total) by mouth daily. 90 tablet 3  . ticagrelor (BRILINTA) 90 MG TABS tablet Take 1 tablet (90 mg total) by mouth 2 (two) times daily. 60 tablet 11   Current Facility-Administered Medications  Medication Dose Route Frequency Provider Last Rate Last Dose  . ipratropium-albuterol (DUONEB) 0.5-2.5 (3) MG/3ML nebulizer solution 3 mL  3 mL Nebulization Once Dorothyann Peng, NP         Past Medical History:  Diagnosis Date  .  Acute diastolic congestive heart failure ([place]) 09/20/2015  . Arthritis    "fingers" (08/27/2016)  . CAD (coronary artery disease)    a. 06/2007 PCI LAD->Cypher DES;  b. 02/2010 Cath LM 30-40, LAD 40-9m, patent stent, D2 50, RI 70 ost, LCX 50, OM 30, RCA 30-35m, 30d, PDA 50, RPL 40-50p, nl EF.  . Carotid artery occlusion    a. 07/2011 L CEA;  b. 02/2012 Carotid U/S: RICA 5-00%, LICA patent.  . Chronic back pain    "moves up and down my back" (08/27/2016)  . Chronic bronchitis ([place])   . CVA (cerebral vascular accident) ([place]) 2001, 2004   "I've only had 1; hemorrhagic; don't remember specifically when"  (08/27/2016)  . GERD (gastroesophageal reflux disease)   . Heart murmur   . History of blood transfusion    "related to a surgery I think"  . Hyperlipidemia   . Hypertension   . Kidney stones   . OSA on CPAP 07/20/2015  . Peripheral vascular disease ([place]) 2001  . Pneumonia    "a number of times" (08/27/2016)  . Type II diabetes mellitus ([place])     Past Surgical History:  Procedure Laterality Date  . CARDIAC CATHETERIZATION  02/2010; 08/27/2016   "no stent"/notes 03/28/2010; unable to put stent in cause of spasms"  . CARDIAC CATHETERIZATION N/A 08/27/2016   Procedure: Left Heart Cath and Coronary Angiography;  Surgeon: Troy Sine, MD;  Location: [facility];  Service: Cardiovascular;  Laterality: N/A;  . CARDIAC CATHETERIZATION N/A 08/28/2016   Procedure: Coronary Stent Intervention;  Surgeon: Troy Sine, MD;  Location: [facility];  Service: Cardiovascular;  Laterality: N/A;  . CAROTID ENDARTERECTOMY Left 08/23/2011  . CORONARY ANGIOPLASTY WITH STENT PLACEMENT  06-30-07  . CYSTOSCOPY W/ STONE MANIPULATION    . INGUINAL HERNIA REPAIR Right 1962  . Andrew   "cut me open"    Social History   Social History  . Marital status: Married    Spouse name: N/A  . Number of children: N/A  . Years of education: N/A   Occupational History  . Medical illustrator    Social History Main Topics  . Smoking status: Never Smoker  . Smokeless tobacco: Never Used  . Alcohol use No  . Drug use: No  . Sexual activity: Not Currently   Other Topics Concern  . Not on file   Social History Narrative   Works part time as an Medical illustrator.   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    One son who lives in [place] - son is adopted.    She takes care of his wife, she is oxygen continuously.     Family History  Problem Relation Age of Onset  . Heart disease Mother     CHF Heart Disease before age 22  . Heart disease Father     Heart Disease before age 13  . Cancer Brother      Lung  . Cancer Brother     Lung  . COPD Brother     ROS: no fevers or chills, productive cough, hemoptysis, dysphasia, odynophagia, melena, hematochezia, dysuria, hematuria, rash, seizure activity, orthopnea, PND, claudication. Remaining systems are negative.  Physical Exam: Well-developed well-nourished in no acute distress.  Skin is warm and dry.  HEENT is normal.  Neck is supple. No bruits. Chest is clear to auscultation with normal expansion. No wheeze Cardiovascular exam is irregular Abdominal exam nontender or distended. No masses palpated. Extremities show  trace edema. neuro grossly intact  ECG- atrial fibrillation at a rate of 95. Occasional PVC or aberrantly conducted beat. Left axis deviation. Left ventricular hypertrophy. Cannot rule out prior anterior infarct. personally reviewed  A/P  1 coronary artery disease-patient had 2 drug-eluting stents placed August 2017. Continue ASA and brilinta; continue statin. Pt with plavix allergy.  2 persistent atrial fibrillation-patient remains in atrial fibrillation. Continue atenolol for rate control. CHADSvasc 8. He would benefit from anticoagulation long-term. 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 Apparently this was unprovoked. He is presently on dual oral antiplatelet therapy and this will be continued for another 3 months. He cannot take Plavix due to allergy. I would favor continuing present therapy for 3 more months and then changing to aspirin and DOAC (apixaban 5 BID). I will ask Dr Christella Noa of neurosurgery to comment on his risk of recurrent intracranial hemorrhage as he cared for him in 2003.   3 carotid artery disease-continue statin. Followed by vascular surgery.  4 hypertension-blood pressure controlled. Continue present medications.  5 hyperlipidemia-continue statin.  6 chronic diastolic congestive heart failure-euvolemic on examination. Continue present dose of  diuretics.  Kirk Ruths, MD

## 2017-04-15 ENCOUNTER — Other Ambulatory Visit: Payer: Medicare Other

## 2017-04-15 ENCOUNTER — Other Ambulatory Visit: Payer: Self-pay

## 2017-04-15 ENCOUNTER — Other Ambulatory Visit: Payer: Self-pay | Admitting: *Deleted

## 2017-04-15 ENCOUNTER — Ambulatory Visit (HOSPITAL_COMMUNITY): Payer: Medicare Other | Attending: Cardiovascular Disease

## 2017-04-15 DIAGNOSIS — I517 Cardiomegaly: Secondary | ICD-10-CM | POA: Diagnosis not present

## 2017-04-15 DIAGNOSIS — I4891 Unspecified atrial fibrillation: Secondary | ICD-10-CM

## 2017-04-15 MED ORDER — PERFLUTREN LIPID MICROSPHERE
1.0000 mL | INTRAVENOUS | Status: AC | PRN
  Administered 2017-04-15: 2 mL via INTRAVENOUS

## 2017-04-16 LAB — BASIC METABOLIC PANEL
BUN/Creatinine Ratio: 22 (ref 10–24)
BUN: 24 mg/dL (ref 8–27)
CO2: 23 mmol/L (ref 18–29)
Calcium: 9.4 mg/dL (ref 8.6–10.2)
Chloride: 100 mmol/L (ref 96–106)
Creatinine, Ser: 1.1 mg/dL (ref 0.76–1.27)
GFR calc Af Amer: 76 mL/min/{1.73_m2} (ref >59)
GFR calc non Af Amer: 65 mL/min/{1.73_m2} (ref >59)
Glucose: 190 mg/dL — ABNORMAL HIGH (ref 65–99)
Potassium: 3.9 mmol/L (ref 3.5–5.2)
Sodium: 142 mmol/L (ref 134–144)

## 2017-04-16 LAB — TSH: TSH: 1.41 u[IU]/mL (ref 0.450–4.500)

## 2017-04-16 LAB — MAGNESIUM: Magnesium: 1.4 mg/dL — ABNORMAL LOW (ref 1.6–2.3)

## 2017-04-22 ENCOUNTER — Other Ambulatory Visit: Payer: Self-pay | Admitting: Adult Health

## 2017-04-22 DIAGNOSIS — I10 Essential (primary) hypertension: Secondary | ICD-10-CM

## 2017-04-22 DIAGNOSIS — E109 Type 1 diabetes mellitus without complications: Secondary | ICD-10-CM

## 2017-04-22 NOTE — Telephone Encounter (Signed)
He should be following up with his new PCP shortly. Ok to give him 30 days

## 2017-04-24 ENCOUNTER — Encounter: Payer: Self-pay | Admitting: Cardiology

## 2017-04-24 ENCOUNTER — Ambulatory Visit (INDEPENDENT_AMBULATORY_CARE_PROVIDER_SITE_OTHER): Payer: Medicare Other | Admitting: Cardiology

## 2017-04-24 VITALS — BP 112/60 | HR 96 | Ht 68.0 in | Wt 243.0 lb

## 2017-04-24 DIAGNOSIS — E7800 Pure hypercholesterolemia, unspecified: Secondary | ICD-10-CM

## 2017-04-24 DIAGNOSIS — I2510 Atherosclerotic heart disease of native coronary artery without angina pectoris: Secondary | ICD-10-CM

## 2017-04-24 DIAGNOSIS — I6523 Occlusion and stenosis of bilateral carotid arteries: Secondary | ICD-10-CM

## 2017-04-24 DIAGNOSIS — I10 Essential (primary) hypertension: Secondary | ICD-10-CM | POA: Diagnosis not present

## 2017-04-24 DIAGNOSIS — I4891 Unspecified atrial fibrillation: Secondary | ICD-10-CM

## 2017-04-24 NOTE — Patient Instructions (Signed)
Your physician recommends that you schedule a follow-up appointment in: 3 MONTHS WITH DR CRENSHAW  If you need a refill on your cardiac medications before your next appointment, please call your pharmacy.   

## 2017-05-10 ENCOUNTER — Other Ambulatory Visit: Payer: Self-pay | Admitting: Adult Health

## 2017-05-13 ENCOUNTER — Telehealth: Payer: Self-pay | Admitting: *Deleted

## 2017-05-13 ENCOUNTER — Other Ambulatory Visit: Payer: Self-pay | Admitting: *Deleted

## 2017-05-13 DIAGNOSIS — R790 Abnormal level of blood mineral: Secondary | ICD-10-CM

## 2017-05-13 NOTE — Telephone Encounter (Signed)
-----   Message from Troy Sine, MD sent at 05/11/2017  9:44 AM EDT ----- Labs stable, except glucose increased.  Magnesium is mildly low.  Consider recheck magnesium and if still low, and mag oxide

## 2017-05-14 ENCOUNTER — Other Ambulatory Visit: Payer: Self-pay | Admitting: Adult Health

## 2017-05-14 DIAGNOSIS — E109 Type 1 diabetes mellitus without complications: Secondary | ICD-10-CM

## 2017-05-14 DIAGNOSIS — I10 Essential (primary) hypertension: Secondary | ICD-10-CM

## 2017-06-18 ENCOUNTER — Other Ambulatory Visit: Payer: Self-pay | Admitting: Adult Health

## 2017-06-22 ENCOUNTER — Other Ambulatory Visit: Payer: Self-pay | Admitting: Adult Health

## 2017-06-22 DIAGNOSIS — I10 Essential (primary) hypertension: Secondary | ICD-10-CM

## 2017-06-22 DIAGNOSIS — E109 Type 1 diabetes mellitus without complications: Secondary | ICD-10-CM

## 2017-06-26 ENCOUNTER — Other Ambulatory Visit: Payer: Self-pay | Admitting: Family Medicine

## 2017-06-26 MED ORDER — FUROSEMIDE 40 MG PO TABS: ORAL_TABLET | ORAL | 0 refills | Status: DC

## 2017-07-07 DIAGNOSIS — Z794 Long term (current) use of insulin: Secondary | ICD-10-CM | POA: Diagnosis not present

## 2017-07-07 DIAGNOSIS — E119 Type 2 diabetes mellitus without complications: Secondary | ICD-10-CM | POA: Diagnosis not present

## 2017-07-07 DIAGNOSIS — I2510 Atherosclerotic heart disease of native coronary artery without angina pectoris: Secondary | ICD-10-CM | POA: Diagnosis not present

## 2017-07-07 DIAGNOSIS — Z9861 Coronary angioplasty status: Secondary | ICD-10-CM | POA: Diagnosis not present

## 2017-07-07 DIAGNOSIS — I10 Essential (primary) hypertension: Secondary | ICD-10-CM | POA: Diagnosis not present

## 2017-07-07 LAB — HEMOGLOBIN A1C: Hemoglobin A1C: 10.6

## 2017-07-08 NOTE — Progress Notes (Signed)
HPI: FU CAD and atrial fibrillation; s/p Cypher DES to the LAD 7/08. Also with chronic edema and cerebrovascular disease (followed by vascular surgery). Abd ultrasound 5/16 showed no aneurysm. Echo 9/16 showed normal LV function, mild LAE. Cath 8/17 showed 85 mid RCA, 90 distal RCA, 85 RPLB. Pt had 2 DES. Echocardiogram April 2018 showed normal LV function, severe left atrial enlargement. Carotid Dopplers March 2018 showed 1-39% right and patent left carotid artery. Patient previously found to be in new-onset atrial fibrillation. ASA and brilinta continued (coumadin not added due to h/o ICH and need for dual antiplt therapy). Note his intracranial hemorrhage was a cerebellar hematoma in November 2003. It did not require evacuation. Since last seen, he has some dyspnea on exertion but no orthopnea, PND, pedal edema, palpitations, syncope or chest pain.  Current Outpatient Prescriptions  Medication Sig Dispense Refill  . albuterol (PROVENTIL HFA;VENTOLIN HFA) 108 (90 Base) MCG/ACT inhaler Inhale 2 puffs into the lungs every 6 (six) hours as needed for wheezing or shortness of breath. 1 Inhaler 0  . amLODipine (NORVASC) 10 MG tablet TAKE 1 TABLET (10 MG TOTAL) BY MOUTH DAILY. 90 tablet 0  . aspirin EC 81 MG tablet Take 1 tablet (81 mg total) by mouth daily.    [name] atenolol (TENORMIN) 100 MG tablet TAKE 1 TABLET (100 MG TOTAL) BY MOUTH 2 (TWO) TIMES DAILY. 200 tablet 0  . atorvastatin (LIPITOR) 80 MG tablet Take 1 tablet (80 mg total) by mouth daily at 6 PM. 30 tablet 12  . benzonatate (TESSALON) 200 MG capsule Take 1 capsule (200 mg total) by mouth 2 (two) times daily as needed for cough. 20 capsule 1  . furosemide (LASIX) 20 MG tablet Take 1 tablet (20 mg total) by mouth daily. 90 tablet 3  . furosemide (LASIX) 40 MG tablet TAKE 1 TABLET BY MOUTH IN THE AM, 1/2 TABLET IN THE EVENING AS DIRECTED 135 tablet 0  . Insulin Degludec (TRESIBA FLEXTOUCH [place]) Inject 25 Units into the skin daily.    [name]  lisinopril (PRINIVIL,ZESTRIL) 40 MG tablet TAKE 1 TABLET BY MOUTH EVERY DAY 30 tablet 0  . metFORMIN (GLUCOPHAGE) 1000 MG tablet TAKE 1 TABLET BY MOUTH TWICE DAILY WITH MEALS 180 tablet 1  . nitroGLYCERIN (NITROSTAT) 0.4 MG SL tablet Place 1 tablet (0.4 mg total) under the tongue every 5 (five) minutes as needed for chest pain. 25 tablet 2  . pantoprazole (PROTONIX) 40 MG tablet Take 1 tablet (40 mg total) by mouth daily. 30 tablet 12  . spironolactone (ALDACTONE) 25 MG tablet Take 1 tablet (25 mg total) by mouth daily. 90 tablet 3  . ticagrelor (BRILINTA) 90 MG TABS tablet Take 1 tablet (90 mg total) by mouth 2 (two) times daily. 60 tablet 11   Current Facility-Administered Medications  Medication Dose Route Frequency Provider Last Rate Last Dose  . ipratropium-albuterol (DUONEB) 0.5-2.5 (3) MG/3ML nebulizer solution 3 mL  3 mL Nebulization Once Dorothyann Peng, NP         Past Medical History:  Diagnosis Date  . Acute diastolic congestive heart failure ([place]) 09/20/2015  . Arthritis    "fingers" (08/27/2016)  . CAD (coronary artery disease)    a. 06/2007 PCI LAD->Cypher DES;  b. 02/2010 Cath LM 30-40, LAD 40-68m, patent stent, D2 50, RI 70 ost, LCX 50, OM 30, RCA 30-17m, 30d, PDA 50, RPL 40-50p, nl EF.  . Carotid artery occlusion    a. 07/2011 L CEA;  b. 02/2012 Carotid  U/S: RICA 2-70%, LICA patent.  . Chronic back pain    "moves up and down my back" (08/27/2016)  . Chronic bronchitis ([place])   . CVA (cerebral vascular accident) ([place]) 2001, 2004   "I've only had 1; hemorrhagic; don't remember specifically when" (08/27/2016)  . GERD (gastroesophageal reflux disease)   . Heart murmur   . History of blood transfusion    "related to a surgery I think"  . Hyperlipidemia   . Hypertension   . Kidney stones   . OSA on CPAP 07/20/2015  . Peripheral vascular disease ([place]) 2001  . Pneumonia    "a number of times" (08/27/2016)  . Type II diabetes mellitus ([place])     Past Surgical History:  Procedure  Laterality Date  . CARDIAC CATHETERIZATION  02/2010; 08/27/2016   "no stent"/notes 03/28/2010; unable to put stent in cause of spasms"  . CARDIAC CATHETERIZATION N/A 08/27/2016   Procedure: Left Heart Cath and Coronary Angiography;  Surgeon: Troy Sine, MD;  Location: [facility];  Service: Cardiovascular;  Laterality: N/A;  . CARDIAC CATHETERIZATION N/A 08/28/2016   Procedure: Coronary Stent Intervention;  Surgeon: Troy Sine, MD;  Location: [facility];  Service: Cardiovascular;  Laterality: N/A;  . CAROTID ENDARTERECTOMY Left 08/23/2011  . CORONARY ANGIOPLASTY WITH STENT PLACEMENT  06-30-07  . CYSTOSCOPY W/ STONE MANIPULATION    . INGUINAL HERNIA REPAIR Right 1962  . [place]   "cut me open"    Social History   Social History  . Marital status: Married    Spouse name: N/A  . Number of children: N/A  . Years of education: N/A   Occupational History  . Medical illustrator    Social History Main Topics  . Smoking status: Never Smoker  . Smokeless tobacco: Never Used  . Alcohol use No  . Drug use: No  . Sexual activity: Not Currently   Other Topics Concern  . Not on file   Social History Narrative   Works part time as an Medical illustrator.   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    One son who lives in [place] - son is adopted.    She takes care of his wife, she is oxygen continuously.     Family History  Problem Relation Age of Onset  . Heart disease Mother        CHF Heart Disease before age 16  . Heart disease Father        Heart Disease before age 47  . Cancer Brother        Lung  . Cancer Brother        Lung  . COPD Brother     ROS: no fevers or chills, productive cough, hemoptysis, dysphasia, odynophagia, melena, hematochezia, dysuria, hematuria, rash, seizure activity, orthopnea, PND, pedal edema, claudication. Remaining systems are negative.  Physical Exam: Well-developed well-nourished in no acute distress.  Skin is warm and dry.  HEENT  is normal.  Neck is supple.  Chest is clear to auscultation with normal expansion.  Cardiovascular exam is regular rate and rhythm.  Abdominal exam nontender or distended. No masses palpated. Extremities show no edema. neuro grossly intact  ECG- Sinus rhythm at a rate of 62. Left axis deviation. No ST changes. Poor R-wave progression. personally reviewed  A/P  1 paroxysmal atrial fibrillation-pt in sinus today; we will continue atenolol for rate control if atrial fibrillation recurs. CHADSvasc 8. He would benefit from anticoagulation long-term. He is now  approximately one year since his previous stents were placed. He has a Plavix allergy. I will discontinue brilinta 08/28/16 and treat with apixaban 5 mg BID (begin 1 week after DCing brilinta); continue asa 81 mg daily. Note he does have a history of intracranial hemorrhage but that was in 2003 and did not require evacuation.  2 coronary artery disease-patient with Plavix allergy. Continue aspirin and DC brilinta 08/28/16. Continue statin.  3 carotid artery disease-continue statin. Followed by vascular surgery.  4 hypertension-blood pressure is controlled. Continue present medications.  5 hyperlipidemia-continue statin.  6 chronic diastolic congestive heart failure-patient is euvolemic on examination. Continue present dose of diuretics.  Kirk Ruths, MD

## 2017-07-17 ENCOUNTER — Ambulatory Visit (INDEPENDENT_AMBULATORY_CARE_PROVIDER_SITE_OTHER): Payer: Medicare Other | Admitting: Cardiology

## 2017-07-17 ENCOUNTER — Encounter: Payer: Self-pay | Admitting: Cardiology

## 2017-07-17 VITALS — BP 136/74 | HR 68 | Ht 68.0 in | Wt 242.0 lb

## 2017-07-17 DIAGNOSIS — I480 Paroxysmal atrial fibrillation: Secondary | ICD-10-CM

## 2017-07-17 DIAGNOSIS — I10 Essential (primary) hypertension: Secondary | ICD-10-CM | POA: Diagnosis not present

## 2017-07-17 DIAGNOSIS — I6523 Occlusion and stenosis of bilateral carotid arteries: Secondary | ICD-10-CM | POA: Diagnosis not present

## 2017-07-17 DIAGNOSIS — E7800 Pure hypercholesterolemia, unspecified: Secondary | ICD-10-CM | POA: Diagnosis not present

## 2017-07-17 DIAGNOSIS — I2510 Atherosclerotic heart disease of native coronary artery without angina pectoris: Secondary | ICD-10-CM | POA: Diagnosis not present

## 2017-07-17 MED ORDER — APIXABAN 5 MG PO TABS: 5.0000 mg | ORAL_TABLET | Freq: Two times a day (BID) | ORAL | 6 refills | Status: DC

## 2017-07-17 NOTE — Patient Instructions (Signed)
Medication Instructions:   STOP BRILINTA 08-28-17 AFTER ONE WEEK OFF THE BRILINTA START ELIQUIS 5 MG ONE TABLET TWICE DAILY  Follow-Up:  Your physician recommends that you schedule a follow-up appointment in: [place]   If you need a refill on your cardiac medications before your next appointment, please call your pharmacy.

## 2017-07-21 DIAGNOSIS — L84 Corns and callosities: Secondary | ICD-10-CM | POA: Diagnosis not present

## 2017-07-21 DIAGNOSIS — E1142 Type 2 diabetes mellitus with diabetic polyneuropathy: Secondary | ICD-10-CM | POA: Diagnosis not present

## 2017-07-21 DIAGNOSIS — B351 Tinea unguium: Secondary | ICD-10-CM | POA: Diagnosis not present

## 2017-07-21 DIAGNOSIS — L603 Nail dystrophy: Secondary | ICD-10-CM | POA: Diagnosis not present

## 2017-07-25 DIAGNOSIS — B351 Tinea unguium: Secondary | ICD-10-CM | POA: Diagnosis not present

## 2017-08-11 ENCOUNTER — Other Ambulatory Visit: Payer: Self-pay | Admitting: Adult Health

## 2017-08-12 NOTE — Telephone Encounter (Signed)
Sent to the pharmacy by e-scribe. 

## 2017-08-19 ENCOUNTER — Other Ambulatory Visit: Payer: Self-pay | Admitting: Adult Health

## 2017-08-19 DIAGNOSIS — I10 Essential (primary) hypertension: Secondary | ICD-10-CM

## 2017-08-19 DIAGNOSIS — E109 Type 1 diabetes mellitus without complications: Secondary | ICD-10-CM

## 2017-08-20 NOTE — Telephone Encounter (Signed)
Denied.  [name] is no longer this patient's primary care. Message sent to the pharmacy.

## 2017-08-28 ENCOUNTER — Other Ambulatory Visit: Payer: Self-pay

## 2017-08-28 MED ORDER — ATORVASTATIN CALCIUM 80 MG PO TABS: 80.0000 mg | ORAL_TABLET | Freq: Every day | ORAL | 12 refills | Status: DC

## 2017-09-02 DIAGNOSIS — Z9861 Coronary angioplasty status: Secondary | ICD-10-CM | POA: Diagnosis not present

## 2017-09-02 DIAGNOSIS — I480 Paroxysmal atrial fibrillation: Secondary | ICD-10-CM | POA: Diagnosis not present

## 2017-09-02 DIAGNOSIS — I10 Essential (primary) hypertension: Secondary | ICD-10-CM | POA: Diagnosis not present

## 2017-09-02 DIAGNOSIS — E1100 Type 2 diabetes mellitus with hyperosmolarity without nonketotic hyperglycemic-hyperosmolar coma (NKHHC): Secondary | ICD-10-CM | POA: Diagnosis not present

## 2017-09-02 DIAGNOSIS — I2510 Atherosclerotic heart disease of native coronary artery without angina pectoris: Secondary | ICD-10-CM | POA: Diagnosis not present

## 2017-09-02 DIAGNOSIS — Z9889 Other specified postprocedural states: Secondary | ICD-10-CM | POA: Diagnosis not present

## 2017-09-23 DIAGNOSIS — M545 Low back pain: Secondary | ICD-10-CM | POA: Diagnosis not present

## 2017-09-23 DIAGNOSIS — M5136 Other intervertebral disc degeneration, lumbar region: Secondary | ICD-10-CM | POA: Diagnosis not present

## 2017-09-23 DIAGNOSIS — M9903 Segmental and somatic dysfunction of lumbar region: Secondary | ICD-10-CM | POA: Diagnosis not present

## 2017-09-23 DIAGNOSIS — M5137 Other intervertebral disc degeneration, lumbosacral region: Secondary | ICD-10-CM | POA: Diagnosis not present

## 2017-09-29 DIAGNOSIS — M545 Low back pain: Secondary | ICD-10-CM | POA: Diagnosis not present

## 2017-09-29 DIAGNOSIS — M9903 Segmental and somatic dysfunction of lumbar region: Secondary | ICD-10-CM | POA: Diagnosis not present

## 2017-09-29 DIAGNOSIS — M5136 Other intervertebral disc degeneration, lumbar region: Secondary | ICD-10-CM | POA: Diagnosis not present

## 2017-09-29 DIAGNOSIS — M5137 Other intervertebral disc degeneration, lumbosacral region: Secondary | ICD-10-CM | POA: Diagnosis not present

## 2017-09-30 DIAGNOSIS — E1142 Type 2 diabetes mellitus with diabetic polyneuropathy: Secondary | ICD-10-CM | POA: Diagnosis not present

## 2017-09-30 DIAGNOSIS — L84 Corns and callosities: Secondary | ICD-10-CM | POA: Diagnosis not present

## 2017-09-30 DIAGNOSIS — L603 Nail dystrophy: Secondary | ICD-10-CM | POA: Diagnosis not present

## 2017-10-13 DIAGNOSIS — Z23 Encounter for immunization: Secondary | ICD-10-CM | POA: Diagnosis not present

## 2017-10-13 DIAGNOSIS — M9903 Segmental and somatic dysfunction of lumbar region: Secondary | ICD-10-CM | POA: Diagnosis not present

## 2017-10-13 DIAGNOSIS — I4891 Unspecified atrial fibrillation: Secondary | ICD-10-CM | POA: Diagnosis not present

## 2017-10-13 DIAGNOSIS — M5137 Other intervertebral disc degeneration, lumbosacral region: Secondary | ICD-10-CM | POA: Diagnosis not present

## 2017-10-13 DIAGNOSIS — Z794 Long term (current) use of insulin: Secondary | ICD-10-CM | POA: Diagnosis not present

## 2017-10-13 DIAGNOSIS — I2510 Atherosclerotic heart disease of native coronary artery without angina pectoris: Secondary | ICD-10-CM | POA: Diagnosis not present

## 2017-10-13 DIAGNOSIS — E1142 Type 2 diabetes mellitus with diabetic polyneuropathy: Secondary | ICD-10-CM | POA: Diagnosis not present

## 2017-10-13 DIAGNOSIS — E785 Hyperlipidemia, unspecified: Secondary | ICD-10-CM | POA: Diagnosis not present

## 2017-10-13 DIAGNOSIS — Z9861 Coronary angioplasty status: Secondary | ICD-10-CM | POA: Diagnosis not present

## 2017-10-13 DIAGNOSIS — M5136 Other intervertebral disc degeneration, lumbar region: Secondary | ICD-10-CM | POA: Diagnosis not present

## 2017-10-13 DIAGNOSIS — M545 Low back pain: Secondary | ICD-10-CM | POA: Diagnosis not present

## 2017-10-16 DIAGNOSIS — M9903 Segmental and somatic dysfunction of lumbar region: Secondary | ICD-10-CM | POA: Diagnosis not present

## 2017-10-16 DIAGNOSIS — M545 Low back pain: Secondary | ICD-10-CM | POA: Diagnosis not present

## 2017-10-16 DIAGNOSIS — M5137 Other intervertebral disc degeneration, lumbosacral region: Secondary | ICD-10-CM | POA: Diagnosis not present

## 2017-10-16 DIAGNOSIS — M5136 Other intervertebral disc degeneration, lumbar region: Secondary | ICD-10-CM | POA: Diagnosis not present

## 2017-10-17 NOTE — Progress Notes (Deleted)
HPI: FU CAD and atrial fibrillation; s/p Cypher DES to the LAD 7/08. Also with chronic edema and cerebrovascular disease (followed by vascular surgery). Abd ultrasound 5/16 showed no aneurysm. Echo 9/16 showed normal LV function, mild LAE. Cath 8/17 showed 85 mid RCA, 90 distal RCA, 85 RPLB. Pt had 2 DES. Echocardiogram [id number] showed normal LV function, severe left atrial enlargement. Carotid Dopplers March 2018 showed 1-39% right and patent left carotid artery. Patient previously found to be in new-onset atrial fibrillation. Pt with h/o intracranial hemorrhage (cerebellar hematoma in November 2003; did not require evacuation). ASA continued given h/o PCI (plavix allergy); started on apixaban at last ov. Since last seen,   Current Outpatient Prescriptions  Medication Sig Dispense Refill  . albuterol (PROVENTIL HFA;VENTOLIN HFA) 108 (90 Base) MCG/ACT inhaler Inhale 2 puffs into the lungs every 6 (six) hours as needed for wheezing or shortness of breath. 1 Inhaler 0  . amLODipine (NORVASC) 10 MG tablet TAKE 1 TABLET (10 MG TOTAL) BY MOUTH DAILY. 90 tablet 0  . apixaban (ELIQUIS) 5 MG TABS tablet Take 1 tablet (5 mg total) by mouth 2 (two) times daily. 60 tablet 6  . aspirin EC 81 MG tablet Take 1 tablet (81 mg total) by mouth daily.    [name] atenolol (TENORMIN) 100 MG tablet TAKE 1 TABLET (100 MG TOTAL) BY MOUTH 2 (TWO) TIMES DAILY. 180 tablet 0  . atorvastatin (LIPITOR) 80 MG tablet Take 1 tablet (80 mg total) by mouth daily at 6 PM. 30 tablet 12  . benzonatate (TESSALON) 200 MG capsule Take 1 capsule (200 mg total) by mouth 2 (two) times daily as needed for cough. 20 capsule 1  . furosemide (LASIX) 20 MG tablet Take 1 tablet (20 mg total) by mouth daily. 90 tablet 3  . furosemide (LASIX) 40 MG tablet TAKE 1 TABLET BY MOUTH IN THE AM, 1/2 TABLET IN THE EVENING AS DIRECTED 135 tablet 0  . Insulin Degludec (TRESIBA FLEXTOUCH [place]) Inject 25 Units into the skin daily.    [name] lisinopril  (PRINIVIL,ZESTRIL) 40 MG tablet TAKE 1 TABLET BY MOUTH EVERY DAY 30 tablet 0  . metFORMIN (GLUCOPHAGE) 1000 MG tablet TAKE 1 TABLET BY MOUTH TWICE DAILY WITH MEALS 180 tablet 1  . nitroGLYCERIN (NITROSTAT) 0.4 MG SL tablet Place 1 tablet (0.4 mg total) under the tongue every 5 (five) minutes as needed for chest pain. 25 tablet 2  . pantoprazole (PROTONIX) 40 MG tablet Take 1 tablet (40 mg total) by mouth daily. 30 tablet 12  . spironolactone (ALDACTONE) 25 MG tablet Take 1 tablet (25 mg total) by mouth daily. 90 tablet 3   Current Facility-Administered Medications  Medication Dose Route Frequency Provider Last Rate Last Dose  . ipratropium-albuterol (DUONEB) 0.5-2.5 (3) MG/3ML nebulizer solution 3 mL  3 mL Nebulization Once Dorothyann Peng, NP         Past Medical History:  Diagnosis Date  . Acute diastolic congestive heart failure ([place]) 09/20/2015  . Arthritis    "fingers" (08/27/2016)  . CAD (coronary artery disease)    a. 06/2007 PCI LAD->Cypher DES;  b. 02/2010 Cath LM 30-40, LAD 40-60m, patent stent, D2 50, RI 70 ost, LCX 50, OM 30, RCA 30-70m, 30d, PDA 50, RPL 40-50p, nl EF.  . Carotid artery occlusion    a. 07/2011 L CEA;  b. 02/2012 Carotid U/S: RICA 7-65%, LICA patent.  . Chronic back pain    "moves up and down my back" (08/27/2016)  . Chronic  bronchitis ([place])   . CVA (cerebral vascular accident) ([place]) 2001, 2004   "I've only had 1; hemorrhagic; don't remember specifically when" (08/27/2016)  . GERD (gastroesophageal reflux disease)   . Heart murmur   . History of blood transfusion    "related to a surgery I think"  . Hyperlipidemia   . Hypertension   . Kidney stones   . OSA on CPAP 07/20/2015  . Peripheral vascular disease ([place]) 2001  . Pneumonia    "a number of times" (08/27/2016)  . Type II diabetes mellitus ([place])     Past Surgical History:  Procedure Laterality Date  . CARDIAC CATHETERIZATION  02/2010; 08/27/2016   "no stent"/notes 03/28/2010; unable to put stent in cause  of spasms"  . CARDIAC CATHETERIZATION N/A 08/27/2016   Procedure: Left Heart Cath and Coronary Angiography;  Surgeon: Troy Sine, MD;  Location: [facility];  Service: Cardiovascular;  Laterality: N/A;  . CARDIAC CATHETERIZATION N/A 08/28/2016   Procedure: Coronary Stent Intervention;  Surgeon: Troy Sine, MD;  Location: [facility];  Service: Cardiovascular;  Laterality: N/A;  . CAROTID ENDARTERECTOMY Left 08/23/2011  . CORONARY ANGIOPLASTY WITH STENT PLACEMENT  06-30-07  . CYSTOSCOPY W/ STONE MANIPULATION    . INGUINAL HERNIA REPAIR Right 1962  . [place]   "cut me open"    Social History   Social History  . Marital status: Married    Spouse name: N/A  . Number of children: N/A  . Years of education: N/A   Occupational History  . Medical illustrator    Social History Main Topics  . Smoking status: Never Smoker  . Smokeless tobacco: Never Used  . Alcohol use No  . Drug use: No  . Sexual activity: Not Currently   Other Topics Concern  . Not on file   Social History Narrative   Works part time as an Medical illustrator.   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    One son who lives in [place] - son is adopted.    She takes care of his wife, she is oxygen continuously.     Family History  Problem Relation Age of Onset  . Heart disease Mother        CHF Heart Disease before age 23  . Heart disease Father        Heart Disease before age 88  . Cancer Brother        Lung  . Cancer Brother        Lung  . COPD Brother     ROS: no fevers or chills, productive cough, hemoptysis, dysphasia, odynophagia, melena, hematochezia, dysuria, hematuria, rash, seizure activity, orthopnea, PND, pedal edema, claudication. Remaining systems are negative.  Physical Exam: Well-developed well-nourished in no acute distress.  Skin is warm and dry.  HEENT is normal.  Neck is supple.  Chest is clear to auscultation with normal expansion.  Cardiovascular exam is regular rate  and rhythm.  Abdominal exam nontender or distended. No masses palpated. Extremities show no edema. neuro grossly intact  ECG- personally reviewed  A/P  1  Kirk Ruths, MD

## 2017-10-23 ENCOUNTER — Ambulatory Visit: Payer: Medicare Other | Admitting: Cardiology

## 2017-12-09 DIAGNOSIS — E1142 Type 2 diabetes mellitus with diabetic polyneuropathy: Secondary | ICD-10-CM | POA: Diagnosis not present

## 2017-12-09 DIAGNOSIS — L84 Corns and callosities: Secondary | ICD-10-CM | POA: Diagnosis not present

## 2017-12-09 DIAGNOSIS — L603 Nail dystrophy: Secondary | ICD-10-CM | POA: Diagnosis not present

## 2018-01-21 DIAGNOSIS — Z9861 Coronary angioplasty status: Secondary | ICD-10-CM | POA: Diagnosis not present

## 2018-01-21 DIAGNOSIS — I10 Essential (primary) hypertension: Secondary | ICD-10-CM | POA: Diagnosis not present

## 2018-01-21 DIAGNOSIS — I2510 Atherosclerotic heart disease of native coronary artery without angina pectoris: Secondary | ICD-10-CM | POA: Diagnosis not present

## 2018-01-21 DIAGNOSIS — Z794 Long term (current) use of insulin: Secondary | ICD-10-CM | POA: Diagnosis not present

## 2018-01-21 DIAGNOSIS — E1165 Type 2 diabetes mellitus with hyperglycemia: Secondary | ICD-10-CM | POA: Diagnosis not present

## 2018-01-21 DIAGNOSIS — E785 Hyperlipidemia, unspecified: Secondary | ICD-10-CM | POA: Diagnosis not present

## 2018-02-12 DIAGNOSIS — L603 Nail dystrophy: Secondary | ICD-10-CM | POA: Diagnosis not present

## 2018-02-12 DIAGNOSIS — E1142 Type 2 diabetes mellitus with diabetic polyneuropathy: Secondary | ICD-10-CM | POA: Diagnosis not present

## 2018-02-12 DIAGNOSIS — L84 Corns and callosities: Secondary | ICD-10-CM | POA: Diagnosis not present

## 2018-04-07 DIAGNOSIS — I679 Cerebrovascular disease, unspecified: Secondary | ICD-10-CM | POA: Diagnosis not present

## 2018-04-07 DIAGNOSIS — I480 Paroxysmal atrial fibrillation: Secondary | ICD-10-CM | POA: Diagnosis not present

## 2018-04-07 DIAGNOSIS — I10 Essential (primary) hypertension: Secondary | ICD-10-CM | POA: Diagnosis not present

## 2018-04-07 DIAGNOSIS — Z9861 Coronary angioplasty status: Secondary | ICD-10-CM | POA: Diagnosis not present

## 2018-04-07 DIAGNOSIS — I2510 Atherosclerotic heart disease of native coronary artery without angina pectoris: Secondary | ICD-10-CM | POA: Diagnosis not present

## 2018-04-07 DIAGNOSIS — I252 Old myocardial infarction: Secondary | ICD-10-CM | POA: Diagnosis not present

## 2018-04-07 DIAGNOSIS — I444 Left anterior fascicular block: Secondary | ICD-10-CM | POA: Diagnosis not present

## 2018-04-16 DIAGNOSIS — E1142 Type 2 diabetes mellitus with diabetic polyneuropathy: Secondary | ICD-10-CM | POA: Diagnosis not present

## 2018-04-16 DIAGNOSIS — L84 Corns and callosities: Secondary | ICD-10-CM | POA: Diagnosis not present

## 2018-04-16 DIAGNOSIS — L603 Nail dystrophy: Secondary | ICD-10-CM | POA: Diagnosis not present

## 2018-06-25 DIAGNOSIS — E1142 Type 2 diabetes mellitus with diabetic polyneuropathy: Secondary | ICD-10-CM | POA: Diagnosis not present

## 2018-06-25 DIAGNOSIS — L603 Nail dystrophy: Secondary | ICD-10-CM | POA: Diagnosis not present

## 2018-06-25 DIAGNOSIS — L84 Corns and callosities: Secondary | ICD-10-CM | POA: Diagnosis not present

## 2018-08-16 ENCOUNTER — Other Ambulatory Visit: Payer: Self-pay | Admitting: Family Medicine

## 2018-08-27 DIAGNOSIS — L603 Nail dystrophy: Secondary | ICD-10-CM | POA: Diagnosis not present

## 2018-08-27 DIAGNOSIS — L84 Corns and callosities: Secondary | ICD-10-CM | POA: Diagnosis not present

## 2018-08-27 DIAGNOSIS — E1142 Type 2 diabetes mellitus with diabetic polyneuropathy: Secondary | ICD-10-CM | POA: Diagnosis not present

## 2018-09-30 ENCOUNTER — Ambulatory Visit: Payer: Medicare Other | Admitting: Family

## 2018-09-30 ENCOUNTER — Encounter (HOSPITAL_COMMUNITY): Payer: Medicare Other

## 2018-10-01 ENCOUNTER — Encounter (HOSPITAL_COMMUNITY): Payer: Medicare Other

## 2018-10-01 ENCOUNTER — Ambulatory Visit: Payer: Medicare Other | Admitting: Family

## 2018-10-01 DIAGNOSIS — Z794 Long term (current) use of insulin: Secondary | ICD-10-CM | POA: Diagnosis not present

## 2018-10-01 DIAGNOSIS — Z9889 Other specified postprocedural states: Secondary | ICD-10-CM | POA: Diagnosis not present

## 2018-10-01 DIAGNOSIS — I10 Essential (primary) hypertension: Secondary | ICD-10-CM | POA: Diagnosis not present

## 2018-10-01 DIAGNOSIS — E1165 Type 2 diabetes mellitus with hyperglycemia: Secondary | ICD-10-CM | POA: Diagnosis not present

## 2018-10-01 DIAGNOSIS — E785 Hyperlipidemia, unspecified: Secondary | ICD-10-CM | POA: Diagnosis not present

## 2018-10-01 DIAGNOSIS — Z9861 Coronary angioplasty status: Secondary | ICD-10-CM | POA: Diagnosis not present

## 2018-10-01 DIAGNOSIS — I6521 Occlusion and stenosis of right carotid artery: Secondary | ICD-10-CM | POA: Diagnosis not present

## 2018-10-01 DIAGNOSIS — I2510 Atherosclerotic heart disease of native coronary artery without angina pectoris: Secondary | ICD-10-CM | POA: Diagnosis not present

## 2018-10-07 DIAGNOSIS — I252 Old myocardial infarction: Secondary | ICD-10-CM | POA: Diagnosis not present

## 2018-10-07 DIAGNOSIS — E1142 Type 2 diabetes mellitus with diabetic polyneuropathy: Secondary | ICD-10-CM | POA: Diagnosis not present

## 2018-10-07 DIAGNOSIS — I480 Paroxysmal atrial fibrillation: Secondary | ICD-10-CM | POA: Diagnosis not present

## 2018-10-07 DIAGNOSIS — I25118 Atherosclerotic heart disease of native coronary artery with other forms of angina pectoris: Secondary | ICD-10-CM | POA: Diagnosis not present

## 2018-10-07 DIAGNOSIS — E785 Hyperlipidemia, unspecified: Secondary | ICD-10-CM | POA: Diagnosis not present

## 2018-10-07 DIAGNOSIS — I10 Essential (primary) hypertension: Secondary | ICD-10-CM | POA: Diagnosis not present

## 2018-10-07 DIAGNOSIS — Z794 Long term (current) use of insulin: Secondary | ICD-10-CM | POA: Diagnosis not present

## 2018-10-14 DIAGNOSIS — I5189 Other ill-defined heart diseases: Secondary | ICD-10-CM | POA: Diagnosis not present

## 2018-10-14 DIAGNOSIS — I493 Ventricular premature depolarization: Secondary | ICD-10-CM | POA: Diagnosis not present

## 2018-10-14 DIAGNOSIS — I444 Left anterior fascicular block: Secondary | ICD-10-CM | POA: Diagnosis not present

## 2018-10-14 DIAGNOSIS — R9439 Abnormal result of other cardiovascular function study: Secondary | ICD-10-CM | POA: Diagnosis not present

## 2018-10-15 ENCOUNTER — Other Ambulatory Visit: Payer: Self-pay | Admitting: Cardiology

## 2018-10-28 DIAGNOSIS — I5031 Acute diastolic (congestive) heart failure: Secondary | ICD-10-CM | POA: Diagnosis not present

## 2018-10-29 DIAGNOSIS — L603 Nail dystrophy: Secondary | ICD-10-CM | POA: Diagnosis not present

## 2018-10-29 DIAGNOSIS — E1142 Type 2 diabetes mellitus with diabetic polyneuropathy: Secondary | ICD-10-CM | POA: Diagnosis not present

## 2018-10-29 DIAGNOSIS — L84 Corns and callosities: Secondary | ICD-10-CM | POA: Diagnosis not present

## 2018-11-04 DIAGNOSIS — I480 Paroxysmal atrial fibrillation: Secondary | ICD-10-CM | POA: Diagnosis not present

## 2018-11-04 DIAGNOSIS — I5031 Acute diastolic (congestive) heart failure: Secondary | ICD-10-CM | POA: Diagnosis not present

## 2018-11-04 DIAGNOSIS — I10 Essential (primary) hypertension: Secondary | ICD-10-CM | POA: Diagnosis not present

## 2018-11-04 DIAGNOSIS — Z9889 Other specified postprocedural states: Secondary | ICD-10-CM | POA: Diagnosis not present

## 2018-11-04 DIAGNOSIS — I6521 Occlusion and stenosis of right carotid artery: Secondary | ICD-10-CM | POA: Diagnosis not present

## 2018-11-13 ENCOUNTER — Other Ambulatory Visit: Payer: Self-pay

## 2018-12-14 DIAGNOSIS — Z23 Encounter for immunization: Secondary | ICD-10-CM | POA: Diagnosis not present

## 2018-12-25 DIAGNOSIS — I5031 Acute diastolic (congestive) heart failure: Secondary | ICD-10-CM | POA: Diagnosis not present

## 2018-12-25 DIAGNOSIS — A419 Sepsis, unspecified organism: Secondary | ICD-10-CM | POA: Diagnosis not present

## 2018-12-25 DIAGNOSIS — R509 Fever, unspecified: Secondary | ICD-10-CM | POA: Diagnosis not present

## 2018-12-25 DIAGNOSIS — R918 Other nonspecific abnormal finding of lung field: Secondary | ICD-10-CM | POA: Diagnosis not present

## 2018-12-25 DIAGNOSIS — I480 Paroxysmal atrial fibrillation: Secondary | ICD-10-CM | POA: Diagnosis not present

## 2018-12-25 DIAGNOSIS — I110 Hypertensive heart disease with heart failure: Secondary | ICD-10-CM | POA: Diagnosis not present

## 2018-12-25 DIAGNOSIS — G9340 Encephalopathy, unspecified: Secondary | ICD-10-CM | POA: Diagnosis not present

## 2018-12-25 DIAGNOSIS — J181 Lobar pneumonia, unspecified organism: Secondary | ICD-10-CM | POA: Diagnosis not present

## 2018-12-25 DIAGNOSIS — I10 Essential (primary) hypertension: Secondary | ICD-10-CM | POA: Diagnosis not present

## 2018-12-25 DIAGNOSIS — E119 Type 2 diabetes mellitus without complications: Secondary | ICD-10-CM | POA: Diagnosis not present

## 2018-12-25 DIAGNOSIS — I447 Left bundle-branch block, unspecified: Secondary | ICD-10-CM | POA: Diagnosis not present

## 2018-12-25 DIAGNOSIS — J159 Unspecified bacterial pneumonia: Secondary | ICD-10-CM | POA: Diagnosis not present

## 2018-12-25 DIAGNOSIS — R05 Cough: Secondary | ICD-10-CM | POA: Diagnosis not present

## 2018-12-25 DIAGNOSIS — E871 Hypo-osmolality and hyponatremia: Secondary | ICD-10-CM | POA: Diagnosis not present

## 2018-12-25 DIAGNOSIS — R410 Disorientation, unspecified: Secondary | ICD-10-CM | POA: Diagnosis not present

## 2018-12-25 DIAGNOSIS — R0789 Other chest pain: Secondary | ICD-10-CM | POA: Diagnosis not present

## 2018-12-25 DIAGNOSIS — R0902 Hypoxemia: Secondary | ICD-10-CM | POA: Diagnosis not present

## 2018-12-25 DIAGNOSIS — R079 Chest pain, unspecified: Secondary | ICD-10-CM | POA: Diagnosis not present

## 2018-12-26 DIAGNOSIS — E871 Hypo-osmolality and hyponatremia: Secondary | ICD-10-CM | POA: Diagnosis present

## 2018-12-26 DIAGNOSIS — Z955 Presence of coronary angioplasty implant and graft: Secondary | ICD-10-CM | POA: Diagnosis not present

## 2018-12-26 DIAGNOSIS — I2510 Atherosclerotic heart disease of native coronary artery without angina pectoris: Secondary | ICD-10-CM | POA: Diagnosis present

## 2018-12-26 DIAGNOSIS — J159 Unspecified bacterial pneumonia: Secondary | ICD-10-CM | POA: Diagnosis present

## 2018-12-26 DIAGNOSIS — I480 Paroxysmal atrial fibrillation: Secondary | ICD-10-CM | POA: Diagnosis present

## 2018-12-26 DIAGNOSIS — R918 Other nonspecific abnormal finding of lung field: Secondary | ICD-10-CM | POA: Diagnosis not present

## 2018-12-26 DIAGNOSIS — R0902 Hypoxemia: Secondary | ICD-10-CM | POA: Diagnosis present

## 2018-12-26 DIAGNOSIS — R748 Abnormal levels of other serum enzymes: Secondary | ICD-10-CM | POA: Diagnosis not present

## 2018-12-26 DIAGNOSIS — R410 Disorientation, unspecified: Secondary | ICD-10-CM | POA: Diagnosis not present

## 2018-12-26 DIAGNOSIS — Z23 Encounter for immunization: Secondary | ICD-10-CM | POA: Diagnosis not present

## 2018-12-26 DIAGNOSIS — I358 Other nonrheumatic aortic valve disorders: Secondary | ICD-10-CM | POA: Diagnosis not present

## 2018-12-26 DIAGNOSIS — G9340 Encephalopathy, unspecified: Secondary | ICD-10-CM | POA: Diagnosis not present

## 2018-12-26 DIAGNOSIS — J189 Pneumonia, unspecified organism: Secondary | ICD-10-CM | POA: Diagnosis not present

## 2018-12-26 DIAGNOSIS — I5031 Acute diastolic (congestive) heart failure: Secondary | ICD-10-CM | POA: Diagnosis not present

## 2018-12-26 DIAGNOSIS — A419 Sepsis, unspecified organism: Secondary | ICD-10-CM | POA: Diagnosis not present

## 2018-12-26 DIAGNOSIS — R509 Fever, unspecified: Secondary | ICD-10-CM | POA: Diagnosis not present

## 2018-12-26 DIAGNOSIS — E119 Type 2 diabetes mellitus without complications: Secondary | ICD-10-CM | POA: Diagnosis present

## 2018-12-26 DIAGNOSIS — I110 Hypertensive heart disease with heart failure: Secondary | ICD-10-CM | POA: Diagnosis present

## 2019-01-23 ENCOUNTER — Other Ambulatory Visit: Payer: Self-pay | Admitting: Cardiology

## 2019-02-18 ENCOUNTER — Other Ambulatory Visit: Payer: Self-pay | Admitting: Cardiology

## 2019-03-13 ENCOUNTER — Other Ambulatory Visit: Payer: Self-pay | Admitting: Cardiology

## 2019-03-23 ENCOUNTER — Other Ambulatory Visit: Payer: Self-pay | Admitting: Cardiology

## 2019-06-20 ENCOUNTER — Other Ambulatory Visit: Payer: Self-pay | Admitting: Cardiology

## 2019-06-29 ENCOUNTER — Other Ambulatory Visit: Payer: Self-pay | Admitting: Cardiology

## 2019-07-14 ENCOUNTER — Other Ambulatory Visit: Payer: Self-pay | Admitting: Cardiology

## 2019-08-02 ENCOUNTER — Other Ambulatory Visit: Payer: Self-pay | Admitting: Cardiology

## 2019-08-22 ENCOUNTER — Other Ambulatory Visit: Payer: Self-pay | Admitting: Cardiology

## 2021-05-04 ENCOUNTER — Encounter: Payer: Self-pay | Admitting: Cardiovascular Disease

## 2021-05-04 ENCOUNTER — Other Ambulatory Visit: Payer: Self-pay

## 2021-05-04 ENCOUNTER — Telehealth: Payer: Self-pay

## 2021-05-04 ENCOUNTER — Ambulatory Visit (INDEPENDENT_AMBULATORY_CARE_PROVIDER_SITE_OTHER): Payer: Medicare Other | Admitting: Cardiovascular Disease

## 2021-05-04 VITALS — BP 138/62 | HR 56 | Ht 68.5 in | Wt 257.0 lb

## 2021-05-04 DIAGNOSIS — R011 Cardiac murmur, unspecified: Secondary | ICD-10-CM

## 2021-05-04 DIAGNOSIS — E785 Hyperlipidemia, unspecified: Secondary | ICD-10-CM | POA: Diagnosis not present

## 2021-05-04 DIAGNOSIS — I5031 Acute diastolic (congestive) heart failure: Secondary | ICD-10-CM

## 2021-05-04 DIAGNOSIS — R0989 Other specified symptoms and signs involving the circulatory and respiratory systems: Secondary | ICD-10-CM

## 2021-05-04 DIAGNOSIS — I2510 Atherosclerotic heart disease of native coronary artery without angina pectoris: Secondary | ICD-10-CM | POA: Diagnosis not present

## 2021-05-04 DIAGNOSIS — I10 Essential (primary) hypertension: Secondary | ICD-10-CM

## 2021-05-04 DIAGNOSIS — Z9861 Coronary angioplasty status: Secondary | ICD-10-CM

## 2021-05-04 DIAGNOSIS — I4891 Unspecified atrial fibrillation: Secondary | ICD-10-CM

## 2021-05-04 MED ORDER — BUMETANIDE 0.5 MG PO TABS: 0.5000 mg | ORAL_TABLET | Freq: Two times a day (BID) | ORAL | 3 refills | Status: DC

## 2021-05-04 NOTE — Assessment & Plan Note (Signed)
History of essential hypertension with blood pressure measured today at 138/62.  He is on amlodipine and lisinopril as well as atenolol.

## 2021-05-04 NOTE — Assessment & Plan Note (Signed)
History of PAF in the past currently in sinus rhythm status post watchman device placed in [place] last year said he did not need to be on oral anticoagulation given his prior cerebellar bleed.

## 2021-05-04 NOTE — Assessment & Plan Note (Signed)
History of remote left carotid endarterectomy.  He has bilateral carotid bruits.  We will check carotid Doppler studies.

## 2021-05-04 NOTE — Progress Notes (Signed)
05/04/2021 Suyash Amory Pekar   April 17, 1941  [id number]  Primary Physician Dagoberto Reef, MD Primary Cardiologist: Lorretta Harp MD Lupe Carney, [place]  HPI:  [name] is a 80 y.o. severely overweight widowed Caucasian male father of 1 child, grandfather 2 grandchildren he was self-referred to be reestablished in our practice.  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  He was previously patient of Dr. Stanford Breed however he moved to [facility] and recently moved back to [place].  History factors include treated hypertension, diabetes and hyperlipidemia.  He did have a remote cerebellar bleed.  He has a history of PAF as well.  He had LAD stenting back in 2008 and RCA stenting by Dr. Claiborne Billings of the mid and distal portion 08/28/2006.  He does complain of dyspnea on exertion which is chronic.  He ran out of his Bumex over the last several months and started swelling again and was recently restarted on it 1 week ago.   Current Meds  Medication Sig  . albuterol (PROVENTIL HFA;VENTOLIN HFA) 108 (90 Base) MCG/ACT inhaler Inhale 2 puffs into the lungs every 6 (six) hours as needed for wheezing or shortness of breath.  [name] amLODipine (NORVASC) 10 MG tablet TAKE 1 TABLET (10 MG TOTAL) BY MOUTH DAILY.  [name] aspirin EC 81 MG tablet Take 1 tablet (81 mg total) by mouth daily.  [name] atenolol (TENORMIN) 100 MG tablet TAKE 1 TABLET (100 MG TOTAL) BY MOUTH 2 (TWO) TIMES DAILY.  [name] atorvastatin (LIPITOR) 80 MG tablet Take 1 tablet (80 mg total) by mouth daily. Office visit needed before additional refills  . docusate sodium (COLACE) 100 MG capsule Take 100 mg by mouth 2 (two) times daily.  . Insulin Degludec (TRESIBA FLEXTOUCH [place]) Inject 48 Units into the skin daily.  . isosorbide mononitrate (IMDUR) 60 MG 24 hr tablet Take 60 mg by mouth every morning.  [name] JANUVIA 100 MG tablet Take 100 mg by mouth daily.  [name] lisinopril (PRINIVIL,ZESTRIL) 40 MG tablet TAKE 1  TABLET BY MOUTH EVERY DAY  . metFORMIN (GLUCOPHAGE) 1000 MG tablet TAKE 1 TABLET BY MOUTH TWICE DAILY WITH MEALS  . pantoprazole (PROTONIX) 40 MG tablet Take 1 tablet (40 mg total) by mouth daily.  . ranolazine (RANEXA) 500 MG 12 hr tablet Take 500 mg by mouth 2 (two) times daily.  . Skin Protectants, Misc. (BASIS FACIAL MOISTURIZER) CREA Apply topically.  [name] spironolactone (ALDACTONE) 25 MG tablet Take 1 tablet (25 mg total) by mouth daily.  . [DISCONTINUED] bumetanide (BUMEX) 0.5 MG tablet Take by mouth.   Current Facility-Administered Medications for the 05/04/21 encounter (Office Visit) with Lorretta Harp, MD  Medication  . ipratropium-albuterol (DUONEB) 0.5-2.5 (3) MG/3ML nebulizer solution 3 mL     Allergies  Allergen Reactions  . Clopidogrel Bisulfate Itching and Rash    REACTION: rash, itching from Head to Toe   -  PLAVIX   . Adhesive [Tape] Rash    rash    Social History   Socioeconomic History  . Marital status: Married    Spouse name: Not on file  . Number of children: Not on file  . Years of education: Not on file  . Highest education level: Not on file  Occupational History  . Occupation: Medical illustrator  Tobacco Use  . Smoking status: Never Smoker  . Smokeless tobacco: Never Used  Substance and Sexual Activity  . Alcohol use: No  Alcohol/week: 0.0 standard drinks  . Drug use: No  . Sexual activity: Not Currently  Other Topics Concern  . Not on file  Social History Narrative   Works part time as an Medical illustrator.   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    One son who lives in [place] - son is adopted.    She takes care of his wife, she is oxygen continuously.    Social Determinants of Health   Financial Resource Strain: Not on file  Food Insecurity: Not on file  Transportation Needs: Not on file  Physical Activity: Not on file  Stress: Not on file  Social Connections: Not on file  Intimate Partner Violence: Not on file     Review of Systems: General:  negative for chills, fever, night sweats or weight changes.  Cardiovascular: negative for chest pain, dyspnea on exertion, edema, orthopnea, palpitations, paroxysmal nocturnal dyspnea or shortness of breath Dermatological: negative for rash Respiratory: negative for cough or wheezing Urologic: negative for hematuria Abdominal: negative for nausea, vomiting, diarrhea, bright red blood per rectum, melena, or hematemesis Neurologic: negative for visual changes, syncope, or dizziness All other systems reviewed and are otherwise negative except as noted above.    Blood pressure 138/62, pulse (!) 56, height 5' 8.5" (1.74 m), weight 257 lb (116.6 kg), SpO2 95 %.  General appearance: alert and no distress Neck: no adenopathy, no JVD, supple, symmetrical, trachea midline, thyroid not enlarged, symmetric, no tenderness/mass/nodules and Bilateral carotid bruits Lungs: clear to auscultation bilaterally Heart: Soft outflow tract murmur Extremities: 2-3+ pitting edema bilaterally Pulses: 2+ and symmetric Skin: Skin color, texture, turgor normal. No rashes or lesions or Some minor cellulitic changes on the left side Neurologic: Alert and oriented X 3, normal strength and tone. Normal symmetric reflexes. Normal coordination and gait  EKG sinus bradycardia 56 with left bundle branch block.  I personally reviewed this EKG.  ASSESSMENT AND PLAN:   Dyslipidemia History of dyslipidemia on statin therapy.  We will recheck a lipid liver profile.  Essential hypertension History of essential hypertension with blood pressure measured today at 138/62.  He is on amlodipine and lisinopril as well as atenolol.  CAD S/P percutaneous coronary angioplasty History of CAD status post LAD PCI in 2008.  He had mid and distal RCA stenting by Dr. Claiborne Billings 08/28/2017 in the setting of unstable angina.  That time his proximal LAD stent was demonstrated to be patent.  He denies chest pain or shortness of breath.  He is only on a  baby aspirin at this time.  Status post carotid endarterectomy History of remote left carotid endarterectomy.  He has bilateral carotid bruits.  We will check carotid Doppler studies.  OSA (obstructive sleep apnea) History of obstructive sleep apnea on CPAP  Acute diastolic congestive heart failure (HCC) History of chronic diastolic heart failure on oral diuretics.  He does have 2-3+ pitting edema and had run out of his Bumex up until 1 week ago which he was restarted on.  He is also on Aldactone.  Atrial fibrillation, new onset ([place]) History of PAF in the past currently in sinus rhythm status post watchman device placed in [place] last year said he did not need to be on oral anticoagulation given his prior cerebellar bleed.      Lorretta Harp MD [place], [facility] 05/04/2021 3:23 PM

## 2021-05-04 NOTE — Assessment & Plan Note (Signed)
History of obstructive sleep apnea on CPAP. 

## 2021-05-04 NOTE — Telephone Encounter (Signed)
Message sent to Dr. Stanford Breed regarding switching providers.

## 2021-05-04 NOTE — Patient Instructions (Signed)
Medication Instructions:  Your physician recommends that you continue on your current medications as directed. Please refer to the Current Medication list given to you today.  *If you need a refill on your cardiac medications before your next appointment, please call your pharmacy*   Lab Work: Your physician recommends that you return for lab work in: next 1-2 weeks for BMET and fasting lipid/liver profile.   If you have labs (blood work) drawn today and your tests are completely normal, you will receive your results only by: [name] MyChart Message (if you have MyChart) OR . A paper copy in the mail If you have any lab test that is abnormal or we need to change your treatment, we will call you to review the results.   Testing/Procedures: Your physician has requested that you have a carotid duplex. This test is an ultrasound of the carotid arteries in your neck. It looks at blood flow through these arteries that supply the brain with blood. Allow one hour for this exam. There are no restrictions or special instructions. This procedure is done at [place]. 2nd Floor  Your physician has requested that you have an echocardiogram. Echocardiography is a painless test that uses sound waves to create images of your heart. It provides your doctor with information about the size and shape of your heart and how well your heart's chambers and valves are working. This procedure takes approximately one hour. There are no restrictions for this procedure. This procedure is done at [address] 3rd Floor   Follow-Up: At [facility], you and your health needs are our priority.  As part of our continuing mission to provide you with exceptional heart care, we have created designated Provider Care Teams.  These Care Teams include your primary Cardiologist (physician) and Advanced Practice Providers (APPs -  Physician Assistants and Nurse Practitioners) who all work together to provide you with the  care you need, when you need it.  We recommend signing up for the patient portal called "MyChart".  Sign up information is provided on this After Visit Summary.  MyChart is used to connect with patients for Virtual Visits (Telemedicine).  Patients are able to view lab/test results, encounter notes, upcoming appointments, etc.  Non-urgent messages can be sent to your provider as well.   To learn more about what you can do with MyChart, go to [email or web address].    Your next appointment:   1 month(s)  The format for your next appointment:   In Person  Provider:   You will see one of the following Advanced Practice Providers on your designated Care Team:    Sande Rives, PA-C  Coletta Memos, FNP  Then, Kirk Ruths, MD will plan to see you again in 3 month(s).

## 2021-05-04 NOTE — Assessment & Plan Note (Signed)
History of dyslipidemia on statin therapy.  We will recheck a lipid liver profile. 

## 2021-05-04 NOTE — Assessment & Plan Note (Signed)
History of CAD status post LAD PCI in 2008.  He had mid and distal RCA stenting by Dr. Claiborne Billings 08/28/2017 in the setting of unstable angina.  That time his proximal LAD stent was demonstrated to be patent.  He denies chest pain or shortness of breath.  He is only on a baby aspirin at this time.

## 2021-05-04 NOTE — Assessment & Plan Note (Signed)
History of chronic diastolic heart failure on oral diuretics.  He does have 2-3+ pitting edema and had run out of his Bumex up until 1 week ago which he was restarted on.  He is also on Aldactone.

## 2021-05-05 NOTE — Telephone Encounter (Signed)
Fine with me.  JJB 

## 2021-05-07 NOTE — Telephone Encounter (Signed)
Follow up scheduled with dr Stanford Breed

## 2021-06-14 ENCOUNTER — Ambulatory Visit (HOSPITAL_COMMUNITY): Payer: Medicare Other | Attending: Cardiology

## 2021-06-14 ENCOUNTER — Other Ambulatory Visit: Payer: Self-pay

## 2021-06-14 DIAGNOSIS — I10 Essential (primary) hypertension: Secondary | ICD-10-CM | POA: Diagnosis present

## 2021-06-14 DIAGNOSIS — R011 Cardiac murmur, unspecified: Secondary | ICD-10-CM | POA: Diagnosis present

## 2021-06-14 LAB — ECHOCARDIOGRAM COMPLETE
Area-P 1/2: 2.8 cm2
S' Lateral: 3.9 cm

## 2021-06-14 MED ORDER — PERFLUTREN LIPID MICROSPHERE
1.0000 mL | INTRAVENOUS | Status: AC | PRN
  Administered 2021-06-14: 1 mL via INTRAVENOUS

## 2021-08-08 NOTE — Progress Notes (Signed)
HPI: FU CAD and atrial fibrillation; s/p Cypher DES to the LAD 7/08; DES to right coronary artery times 31 July 2016. Had intracranial hemorrhage (cerebellar hematoma) in November 2003. It did not require evacuation.  Abd ultrasound 5/16 showed no aneurysm. Cath 8/17 showed 85 mid RCA, 90 distal RCA, 85 RPLB. Pt had 2 DES. Nuclear study October 2019 showed ejection fraction 57%, prior inferolateral infarct with mild peri-infarct ischemia.  Patient had watchman device placed September 2021.  TEE at [facility] October 2021 showed normal LV function, left atrial enlargement with watchman device completely occluding left atrial appendage, mild right atrial enlargement, mild mitral regurgitation, mild aortic insufficiency.  Carotid Dopplers November 2021 at [facility] showed 1 to 49% right stenosis with right subclavian stenosis greater than 50% and patent left carotid endarterectomy site.  Echocardiogram June 2022 showed normal LV function, moderate left ventricular enlargement, mild left ventricular hypertrophy, grade 1 diastolic dysfunction, mild left atrial enlargement.  Since last seen, the patient has dyspnea with more extreme activities but not with routine activities. It is relieved with rest. It is not associated with chest pain. There is no orthopnea, PND. There is no syncope or palpitations. There is no exertional chest pain.  Chronic mild pedal edema.   Current Outpatient Medications  Medication Sig Dispense Refill   albuterol (PROVENTIL HFA;VENTOLIN HFA) 108 (90 Base) MCG/ACT inhaler Inhale 2 puffs into the lungs every 6 (six) hours as needed for wheezing or shortness of breath. 1 Inhaler 0   amLODipine (NORVASC) 10 MG tablet TAKE 1 TABLET (10 MG TOTAL) BY MOUTH DAILY. 90 tablet 0   aspirin EC 81 MG tablet Take 1 tablet (81 mg total) by mouth daily.     atenolol (TENORMIN) 100 MG tablet TAKE 1 TABLET (100 MG TOTAL) BY MOUTH 2 (TWO) TIMES DAILY. 180 tablet 0   atorvastatin (LIPITOR) 80 MG tablet  Take 1 tablet (80 mg total) by mouth daily. Office visit needed before additional refills 15 tablet 0   bumetanide (BUMEX) 0.5 MG tablet Take 1 tablet (0.5 mg total) by mouth 2 (two) times daily. 180 tablet 3   docusate sodium (COLACE) 100 MG capsule Take 100 mg by mouth 2 (two) times daily as needed.     Insulin Degludec (TRESIBA FLEXTOUCH [place]) Inject 48 Units into the skin daily.     isosorbide mononitrate (IMDUR) 60 MG 24 hr tablet Take 60 mg by mouth every morning.     JANUVIA 100 MG tablet Take 100 mg by mouth daily.     lisinopril (PRINIVIL,ZESTRIL) 40 MG tablet TAKE 1 TABLET BY MOUTH EVERY DAY 30 tablet 0   metFORMIN (GLUCOPHAGE) 1000 MG tablet TAKE 1 TABLET BY MOUTH TWICE DAILY WITH MEALS 180 tablet 1   ranolazine (RANEXA) 500 MG 12 hr tablet Take 500 mg by mouth 2 (two) times daily.     spironolactone (ALDACTONE) 25 MG tablet Take 1 tablet (25 mg total) by mouth daily. 90 tablet 3   pantoprazole (PROTONIX) 40 MG tablet Take 1 tablet (40 mg total) by mouth daily. (Patient not taking: Reported on 08/20/2021) 30 tablet 12   Current Facility-Administered Medications  Medication Dose Route Frequency Provider Last Rate Last Admin   ipratropium-albuterol (DUONEB) 0.5-2.5 (3) MG/3ML nebulizer solution 3 mL  3 mL Nebulization Once Dorothyann Peng, NP         Past Medical History:  Diagnosis Date   Acute diastolic congestive heart failure ([place]) 09/20/2015   Arthritis    "fingers" (08/27/2016)  CAD (coronary artery disease)    a. 06/2007 PCI LAD->Cypher DES;  b. 02/2010 Cath LM 30-40, LAD 40-84m, patent stent, D2 50, RI 70 ost, LCX 50, OM 30, RCA 30-41m, 30d, PDA 50, RPL 40-50p, nl EF.   Carotid artery occlusion    a. 07/2011 L CEA;  b. 02/2012 Carotid U/S: RICA 2-99%, LICA patent.   Chronic back pain    "moves up and down my back" (08/27/2016)   Chronic bronchitis (HCC)    CVA (cerebral vascular accident) ([place]) 2001, 2004   "I've only had 1; hemorrhagic; don't remember specifically when"  (08/27/2016)   GERD (gastroesophageal reflux disease)    Heart murmur    History of blood transfusion    "related to a surgery I think"   Hyperlipidemia    Hypertension    Kidney stones    OSA on CPAP 07/20/2015   Peripheral vascular disease ([place]) 2001   Pneumonia    "a number of times" (08/27/2016)   Type II diabetes mellitus ([place])     Past Surgical History:  Procedure Laterality Date   CARDIAC CATHETERIZATION  02/2010; 08/27/2016   "no stent"/notes 03/28/2010; unable to put stent in cause of spasms"   CARDIAC CATHETERIZATION N/A 08/27/2016   Procedure: Left Heart Cath and Coronary Angiography;  Surgeon: Troy Sine, MD;  Location: [facility];  Service: Cardiovascular;  Laterality: N/A;   CARDIAC CATHETERIZATION N/A 08/28/2016   Procedure: Coronary Stent Intervention;  Surgeon: Troy Sine, MD;  Location: [facility];  Service: Cardiovascular;  Laterality: N/A;   CAROTID ENDARTERECTOMY Left 08/23/2011   CORONARY ANGIOPLASTY WITH STENT PLACEMENT  06-30-07   CYSTOSCOPY W/ STONE MANIPULATION     INGUINAL HERNIA REPAIR Right 1962   KIDNEY STONE SURGERY  1969   "cut me open"    Social History   Socioeconomic History   Marital status: Widowed    Spouse name: Not on file   Number of children: Not on file   Years of education: Not on file   Highest education level: Not on file  Occupational History   Occupation: Medical illustrator  Tobacco Use   Smoking status: Never   Smokeless tobacco: Never  Substance and Sexual Activity   Alcohol use: No    Alcohol/week: 0.0 standard drinks   Drug use: No   Sexual activity: Not Currently  Other Topics Concern   Not on file  Social History Narrative   Works part time as an Medical illustrator.   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    One son who lives in [place] - son is adopted.    She takes care of his wife, she is oxygen continuously.    Social Determinants of Health   Financial Resource Strain: Not on file  Food Insecurity: Not on  file  Transportation Needs: Not on file  Physical Activity: Not on file  Stress: Not on file  Social Connections: Not on file  Intimate Partner Violence: Not on file    Family History  Problem Relation Age of Onset   Heart disease Mother        CHF Heart Disease before age 52   Heart disease Father        Heart Disease before age 80   Cancer Brother        Lung   Cancer Brother        Lung   COPD Brother     ROS: no fevers or chills, productive cough, hemoptysis, dysphasia, odynophagia,  melena, hematochezia, dysuria, hematuria, rash, seizure activity, orthopnea, PND, pedal edema, claudication. Remaining systems are negative.  Physical Exam: Well-developed well-nourished in no acute distress.  Skin is warm and dry.  HEENT is normal.  Neck is supple.  Chest is clear to auscultation with normal expansion.  Cardiovascular exam is regular rate and rhythm. 2/6 systolic murmur Abdominal exam nontender or distended. No masses palpated. Extremities show trace edema. neuro grossly intact  A/P  1 paroxysmal atrial fibrillation-patient remains in sinus rhythm on exam.  He has had a watchman placed and therefore not on long-term anticoagulation.  Continue aspirin 81 mg daily.  2 coronary artery disease-he has not had chest pain.  Continue aspirin and statin.  3 carotid artery disease-he will need follow-up carotid Dopplers November 2022.  4 hypertension-blood pressure controlled.  Continue present medical regimen.  5 hyperlipidemia-continue statin.  Check lipids and liver.  6 chronic diastolic congestive heart failure-patient is euvolemic.  Continue diuretics at present dose.  Check potassium and renal function.  Kirk Ruths, MD

## 2021-08-20 ENCOUNTER — Ambulatory Visit (INDEPENDENT_AMBULATORY_CARE_PROVIDER_SITE_OTHER): Payer: Medicare Other | Admitting: Cardiology

## 2021-08-20 ENCOUNTER — Other Ambulatory Visit: Payer: Self-pay

## 2021-08-20 ENCOUNTER — Encounter: Payer: Self-pay | Admitting: Cardiology

## 2021-08-20 VITALS — BP 134/66 | HR 62 | Ht 68.5 in

## 2021-08-20 DIAGNOSIS — I2510 Atherosclerotic heart disease of native coronary artery without angina pectoris: Secondary | ICD-10-CM | POA: Diagnosis not present

## 2021-08-20 DIAGNOSIS — I10 Essential (primary) hypertension: Secondary | ICD-10-CM | POA: Diagnosis not present

## 2021-08-20 DIAGNOSIS — E109 Type 1 diabetes mellitus without complications: Secondary | ICD-10-CM

## 2021-08-20 DIAGNOSIS — Z9861 Coronary angioplasty status: Secondary | ICD-10-CM | POA: Diagnosis not present

## 2021-08-20 DIAGNOSIS — E785 Hyperlipidemia, unspecified: Secondary | ICD-10-CM

## 2021-08-20 MED ORDER — LISINOPRIL 40 MG PO TABS: 40.0000 mg | ORAL_TABLET | Freq: Every day | ORAL | 3 refills | Status: DC

## 2021-08-20 NOTE — Patient Instructions (Signed)
  Lab Work:  Your physician recommends that you HAVE LAB WORK TODAY  If you have labs (blood work) drawn today and your tests are completely normal, you will receive your results only by: MyChart Message (if you have MyChart) OR A paper copy in the mail If you have any lab test that is abnormal or we need to change your treatment, we will call you to review the results.   Follow-Up: At [facility], you and your health needs are our priority.  As part of our continuing mission to provide you with exceptional heart care, we have created designated Provider Care Teams.  These Care Teams include your primary Cardiologist (physician) and Advanced Practice Providers (APPs -  Physician Assistants and Nurse Practitioners) who all work together to provide you with the care you need, when you need it.  We recommend signing up for the patient portal called "MyChart".  Sign up information is provided on this After Visit Summary.  MyChart is used to connect with patients for Virtual Visits (Telemedicine).  Patients are able to view lab/test results, encounter notes, upcoming appointments, etc.  Non-urgent messages can be sent to your provider as well.   To learn more about what you can do with MyChart, go to [email or web address].    Your next appointment:   12 month(s)  The format for your next appointment:   In Person  Provider:   Brian Crenshaw, MD   

## 2021-08-21 ENCOUNTER — Telehealth: Payer: Self-pay | Admitting: *Deleted

## 2021-08-21 ENCOUNTER — Encounter: Payer: Self-pay | Admitting: *Deleted

## 2021-08-21 DIAGNOSIS — E875 Hyperkalemia: Secondary | ICD-10-CM

## 2021-08-21 LAB — LIPID PANEL
Chol/HDL Ratio: 2.5 ratio (ref 0.0–5.0)
Cholesterol, Total: 111 mg/dL (ref 100–199)
HDL: 45 mg/dL (ref >39)
LDL Chol Calc (NIH): 51 mg/dL (ref 0–99)
Triglycerides: 74 mg/dL (ref 0–149)
VLDL Cholesterol Cal: 15 mg/dL (ref 5–40)

## 2021-08-21 LAB — COMPREHENSIVE METABOLIC PANEL
ALT: 16 IU/L (ref 0–44)
AST: 19 IU/L (ref 0–40)
Albumin/Globulin Ratio: 1.6 (ref 1.2–2.2)
Albumin: 4.2 g/dL (ref 3.7–4.7)
Alkaline Phosphatase: 102 IU/L (ref 44–121)
BUN/Creatinine Ratio: 12 (ref 10–24)
BUN: 22 mg/dL (ref 8–27)
Bilirubin Total: 1 mg/dL (ref 0.0–1.2)
CO2: 25 mmol/L (ref 20–29)
Calcium: 9.9 mg/dL (ref 8.6–10.2)
Chloride: 103 mmol/L (ref 96–106)
Creatinine, Ser: 1.86 mg/dL — ABNORMAL HIGH (ref 0.76–1.27)
Globulin, Total: 2.7 g/dL (ref 1.5–4.5)
Glucose: 103 mg/dL — ABNORMAL HIGH (ref 65–99)
Potassium: 5.6 mmol/L — ABNORMAL HIGH (ref 3.5–5.2)
Sodium: 144 mmol/L (ref 134–144)
Total Protein: 6.9 g/dL (ref 6.0–8.5)
eGFR: 36 mL/min/{1.73_m2} — ABNORMAL LOW (ref >59)

## 2021-08-21 NOTE — Telephone Encounter (Signed)
Pt is returning call.  

## 2021-08-21 NOTE — Telephone Encounter (Signed)
Left a message for the patient to call back.  

## 2021-08-21 NOTE — Telephone Encounter (Addendum)
-----   Message from Lelon Perla, MD sent at 08/21/2021  7:45 AM EDT ----- DC spironolactone; decrease to 0.5 mg daily; bmet 1 week Kirk Ruths   Left message for pt to call  Patient is to decrease bumex to 0.5 mg daily per dr Stanford Breed

## 2021-08-22 MED ORDER — BUMETANIDE 0.5 MG PO TABS: 0.5000 mg | ORAL_TABLET | Freq: Every day | ORAL | 3 refills | Status: DC

## 2021-08-22 NOTE — Telephone Encounter (Signed)
Spoke with pt, Aware of dr crenshaw's recommendations.  Lab orders mailed to the pt  

## 2021-08-28 ENCOUNTER — Telehealth: Payer: Self-pay | Admitting: Cardiology

## 2021-08-28 NOTE — Telephone Encounter (Signed)
Patient calling in with questions about the forms he received. Please advise

## 2021-08-28 NOTE — Telephone Encounter (Signed)
Spoke with patient who said he received lab order for BMET. He said 1 week from med change is tomorrow, but he has no car today or tomorrow. Advised OK to come in on Friday

## 2021-08-31 LAB — BASIC METABOLIC PANEL
BUN/Creatinine Ratio: 15 (ref 10–24)
BUN: 23 mg/dL (ref 8–27)
CO2: 23 mmol/L (ref 20–29)
Calcium: 9.2 mg/dL (ref 8.6–10.2)
Chloride: 102 mmol/L (ref 96–106)
Creatinine, Ser: 1.49 mg/dL — ABNORMAL HIGH (ref 0.76–1.27)
Glucose: 105 mg/dL — ABNORMAL HIGH (ref 65–99)
Potassium: 4.8 mmol/L (ref 3.5–5.2)
Sodium: 139 mmol/L (ref 134–144)
eGFR: 47 mL/min/{1.73_m2} — ABNORMAL LOW (ref >59)

## 2021-10-19 ENCOUNTER — Telehealth: Payer: Self-pay | Admitting: Cardiology

## 2021-10-19 NOTE — Telephone Encounter (Signed)
Spoke with Pamala Hurry from [place]. She stated the patient stopped taking spironolactone 1 month ago. He has edema in his legs and is SOB, and for the past 2 days has doubled his dose of bumex. He does not weigh himself. She was calling to get Dr. Jacalyn Lefevre advice. Message sent to Dr. Stanford Breed.

## 2021-10-19 NOTE — Telephone Encounter (Signed)
Returned call to [place] at [place] regarding patient's weight gain. There was no answer. Left message on work voicemail for her to call back to discuss further.

## 2021-10-19 NOTE — Telephone Encounter (Signed)
Pt c/o swelling: STAT is pt has developed SOB within 24 hours  If swelling, where is the swelling located? leg  How much weight have you gained and in what time span? Not sure  Have you gained 3 pounds in a day or 5 pounds in a week? Not sure  Do you have a log of your daily weights (if so, list)? no  Are you currently taking a fluid pill? Yes   Are you currently SOB? no  Have you traveled recently? no  Gevena Mart, Care Coordinator from [place], states the patient has had increased edema in his legs. She says he does not weigh himself so they do not know if he has gained weight. She says she will also reach out to the patient's PCP and has spoken to the patient about weighing himself and taking his BP regularly. She says his last BP reading was 140/65 previous BP and was only taking it once a week.    Pt c/o medication issue:  1. Name of Medication: lisinopril (ZESTRIL) 40 MG tablet  2. How are you currently taking this medication (dosage and times per day)? Does not currently have it  3. Are you having a reaction (difficulty breathing--STAT)? no  4. What is your medication issue? Pamala Hurry states the patient did not have the medication in his box and needs to confirm whether he is supposed to still take it. Phone: [phone]

## 2021-10-22 NOTE — Telephone Encounter (Signed)
Left message for [name] to call

## 2021-10-22 NOTE — Telephone Encounter (Signed)
Left message for Pamala Hurry to call

## 2021-10-22 NOTE — Telephone Encounter (Signed)
[name] with [facility] is returning call.

## 2021-10-22 NOTE — Telephone Encounter (Signed)
Spoke with Pamala Hurry and gave Dr Jacalyn Lefevre recommendations  Patient has appointment with PCP tomorrow

## 2021-10-23 ENCOUNTER — Encounter: Payer: Self-pay | Admitting: *Deleted

## 2021-11-27 ENCOUNTER — Encounter: Payer: Self-pay | Admitting: *Deleted

## 2022-07-23 ENCOUNTER — Encounter (INDEPENDENT_AMBULATORY_CARE_PROVIDER_SITE_OTHER): Payer: Medicare Other | Admitting: Ophthalmology

## 2022-07-23 DIAGNOSIS — H43813 Vitreous degeneration, bilateral: Secondary | ICD-10-CM

## 2022-07-23 DIAGNOSIS — H35033 Hypertensive retinopathy, bilateral: Secondary | ICD-10-CM | POA: Diagnosis not present

## 2022-07-23 DIAGNOSIS — E113392 Type 2 diabetes mellitus with moderate nonproliferative diabetic retinopathy without macular edema, left eye: Secondary | ICD-10-CM

## 2022-07-23 DIAGNOSIS — H353132 Nonexudative age-related macular degeneration, bilateral, intermediate dry stage: Secondary | ICD-10-CM

## 2022-07-23 DIAGNOSIS — I10 Essential (primary) hypertension: Secondary | ICD-10-CM

## 2022-07-23 DIAGNOSIS — D3132 Benign neoplasm of left choroid: Secondary | ICD-10-CM

## 2022-08-17 ENCOUNTER — Other Ambulatory Visit: Payer: Self-pay | Admitting: Cardiology

## 2022-08-17 DIAGNOSIS — E109 Type 1 diabetes mellitus without complications: Secondary | ICD-10-CM

## 2022-08-17 DIAGNOSIS — I10 Essential (primary) hypertension: Secondary | ICD-10-CM

## 2022-09-13 ENCOUNTER — Telehealth: Payer: Self-pay | Admitting: Cardiology

## 2022-09-13 NOTE — Telephone Encounter (Signed)
Called patient, he states he has been having low blood pressures (diastolic) numbers. He is not having any symptoms to report, other than at times his legs feel weak. No changes to medications.   After review of chart patient is over due for yearly follow up. I did get him scheduled with a PA next Thursday 09/21 to discuss further. However he would like to know if Dr.Crenshaw recommends any changes until this appointment.   Will route to MD.

## 2022-09-13 NOTE — Telephone Encounter (Signed)
Called patient back made aware of response from MD.   Patient informed me on this call that he has stopped his medications for the last 3 days, because of how he felt and the bp readings.  Wanted to make you aware.

## 2022-09-13 NOTE — Telephone Encounter (Signed)
Pt c/o BP issue: STAT if pt c/o blurred vision, one-sided weakness or slurred speech  1. What are your last 5 BP readings?  141/48 60 131/50 64 128/57 61 128/45 64 125/39 61   2. Are you having any other symptoms (ex. Dizziness, headache, blurred vision, passed out)? Legs feel weak.    3. What is your BP issue? Patient states diastole number is low.  [name] said it has gone as low as 38.  [name] had an appt with his PCP today but it got canceled.

## 2022-09-16 NOTE — Telephone Encounter (Signed)
Patient reports his bp today was 129/51. He did have a dizzy spells yesterday but it went away and he feels fine today. He will bring his blood pressure cuff to the appointment Thursday.

## 2022-09-18 NOTE — Progress Notes (Addendum)
Cardiology Office Note:    Date:  09/20/2022   ID:  [name], DOB [birth year], MRN [id number]  PCP:  Jacob Reef, MD   [facility] Cardiologist:  Jacob Ruths, MD     Referring MD: Jacob Reef, MD   CC: Here for follow-up for recent dizzy spell and low BP readings   History of Present Illness:    [name] is a 81 y.o. 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 Stanford [name] on August 20, 2021.  History of CAD and A-fib.  Status post drug-eluting stent to LAD in 2008 and drug-eluting stent to the RCA x2 in 2017.  Had a intracranial hemorrhage (cerebellar hematoma) in 2003.  Did not require evacuation.  Abdominal Ultrasound in 2016 did not reveal aneurysm.  Cath in 2017 showed 85% mid RCA, 90% distal RCA, 85% RPLB.  He had 2 drug-eluting stents placed to RCA.  Nuclear medicine stress test in 2019 showed EF 57%, prior infarct inferolaterally with mild peri-infarct ischemia.  Had a Watchman device placed in 2021.  [facility] done at [facility] in 2021 revealed normal LV function, left atrial enlargement with Watchman device completely occluding left atrial appendage, as well as mild right atrial enlargement, mild MR, mild aortic insufficiency.  Had carotid Dopplers done in 2021 at [facility] that showed a 1 to 49% right stenosis with right subclavian stenosis > 50%, left carotid endarterectomy site was patent.  Echo in June 2022 showed normal LV function, mild LVH, moderate left ventricular enlargement, grade 1 diastolic dysfunction, and mild left atrial enlargement.  At last office visit with Dr. Stanford [name], he reported shortness of breath with more extreme activities but not with regular everyday  activities.  Shortness of breath was relieved at rest.  Did not have any associated chest pain.  Denied any exertional chest pain, syncope, palpitations, orthopnea, or PND.  He had chronic mild pedal edema.  He contacted our office within the past week stating his legs were feeling weak, DBP as low as 38.  Says he had been having low diastolic numbers.  He stopped some of his medications for the past 3 days because of how he felt.  Dr. Stanford [name] stated okay to hold amlodipine and okay to continue atenolol and diuretic.  Last BP reported 129/51, had a dizzy spell the other day but went away and felt fine later on. Today he presents for follow-up evaluation of his BP and recent dizzy spell.  Stated he has been participating in physical therapy for leg strengthening.  At therapy 1 week ago he said he got sick to his stomach after therapy, that eventually went away.  The following Monday, his blood pressure was found to be 110/50, says physical therapy was canceled due to low blood pressure readings.  BP log today reveals average SBP of 120s to 130s, lowest 112.  Lowest DBP has been 42.  He said he has held all of his BP medicine for a few days, not sure if it has helped him.  States last Tuesday, he experienced a dizzy spell, this was similar to the hemorrhagic stroke symptom he had 10 years ago.  He says he was really unsure what he  was doing at the time, thinks he was in the bathroom washing his face.  The dizzy episode lasted about 10 minutes, felt like he could pass out, felt discombobulated with going to his chair.  Sat down in his chair and it went away.  Denied any strokelike symptoms or vertigo symptoms.  Denies any recurrent symptoms of dizziness. Today he denies any chest pain, palpitations, syncope, orthopnea, PND, any acute bleeding, swelling, or significant weight changes, or claudication.  Continues to have mild chronic shortness of breath-says this is chronic and has not worsened for him, states he  cannot walk very far.  Amlodipine was not listed on his medication list, but said he has been taking 10 mg daily, as well as the rest of his blood pressure medication, took last dose of BP medicine around 1 AM this morning, says he only takes atenolol once daily instead of twice per day.  Denies any other questions or concerns today. He is a retired Medical illustrator.    Past Medical History:  Diagnosis Date   Acute diastolic congestive heart failure ([place]) 09/20/2015   Arthritis    "fingers" (08/27/2016)   CAD (coronary artery disease)    a. 06/2007 PCI LAD->Cypher DES;  b. 02/2010 Cath LM 30-40, LAD 40-2m patent stent, D2 50, RI 70 ost, LCX 50, OM 30, RCA 30-463m30d, PDA 50, RPL 40-50p, nl EF.   Carotid artery occlusion    a. 07/2011 L CEA;  b. 02/2012 Carotid U/S: RICA 1-0-10%LICA patent.   Chronic back pain    "moves up and down my back" (08/27/2016)   Chronic bronchitis (HCC)    CVA (cerebral vascular accident) ([facility], [place]   "I've only had 1; hemorrhagic; don't remember specifically when" (08/27/2016)   GERD (gastroesophageal reflux disease)    Heart murmur    History of blood transfusion    "related to a surgery I think"   Hyperlipidemia    Hypertension    Kidney stones    OSA on CPAP 07/20/2015   Peripheral vascular disease ([place]   Pneumonia    "a number of times" (08/27/2016)   Type II diabetes mellitus ([facility]    Past Surgical History:  Procedure Laterality Date   CARDIAC CATHETERIZATION  02/2010; 08/27/2016   "no stent"/notes 03/28/2010; unable to put stent in cause of spasms"   CARDIAC CATHETERIZATION N/A 08/27/2016   Procedure: Left Heart Cath and Coronary Angiography;  Surgeon: ThTroy SineMD;  [facility];  Service: Cardiovascular;  Laterality: N/A;   CARDIAC CATHETERIZATION N/A 08/28/2016   Procedure: Coronary Stent Intervention;  Surgeon: ThTroy SineMD;  [facility];  Service: Cardiovascular;  Laterality: N/A;   CAROTID  ENDARTERECTOMY Left 08/23/2011   CORONARY ANGIOPLASTY WITH STENT PLACEMENT  06-30-07   CYSTOSCOPY W/ STONE MANIPULATION     INGUINAL HERNIA REPAIR Right [place] "cut me open"    Current Medications: Current Meds  Medication Sig   albuterol (PROVENTIL HFA;VENTOLIN HFA) 108 (90 Base) MCG/ACT inhaler Inhale 2 puffs into the lungs every 6 (six) hours as needed for wheezing or shortness of breath.   aspirin EC 81 MG tablet Take 1 tablet (81 mg total) by mouth daily.   amlodipine (Norvasc) 10 mg tablet Take 1 tablet (10 mg total) by mouth daily.   atenolol (TENORMIN) 100 MG tablet TAKE 1 TABLET (100 MG TOTAL) BY MOUTH 2 (TWO) TIMES DAILY.  atorvastatin (LIPITOR) 80 MG tablet Take 1 tablet (80 mg total) by mouth daily. Office visit needed before additional refills   bumetanide (BUMEX) 0.5 MG tablet Take 1 tablet (0.5 mg total) by mouth daily.   docusate sodium (COLACE) 100 MG capsule Take 100 mg by mouth 2 (two) times daily as needed.   Insulin Degludec (TRESIBA FLEXTOUCH [place]) Inject 48 Units into the skin daily.   isosorbide mononitrate (IMDUR) 60 MG 24 hr tablet Take 60 mg by mouth every morning.   lisinopril (ZESTRIL) 40 MG tablet TAKE 1 TABLET BY MOUTH EVERY DAY   metFORMIN (GLUCOPHAGE) 1000 MG tablet TAKE 1 TABLET BY MOUTH TWICE DAILY WITH MEALS   pantoprazole (PROTONIX) 40 MG tablet Take 1 tablet (40 mg total) by mouth daily.   ranolazine (RANEXA) 500 MG 12 hr tablet Take 500 mg by mouth 2 (two) times daily.     Allergies:   Clopidogrel bisulfate and Adhesive [tape]   Social History   Socioeconomic History   Marital status: Widowed    Spouse name: Not on file   Number of children: Not on file   Years of education: Not on file   Highest education level: Not on file  Occupational History   Occupation: Medical illustrator  Tobacco Use   Smoking status: Never   Smokeless tobacco: Never  Substance and Sexual Activity   Alcohol use: No    Alcohol/week: 0.0  standard drinks of alcohol   Drug use: No   Sexual activity: Not Currently  Other Topics Concern   Not on file  Social History Narrative   Works part time as an Medical illustrator.   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    One son who lives in [place] - son is adopted.    She takes care of his wife, she is oxygen continuously.    Social Determinants of Health   Financial Resource Strain: Not on file  Food Insecurity: Not on file  Transportation Needs: Not on file  Physical Activity: Not on file  Stress: Not on file  Social Connections: Not on file     Family History: The patient's family history includes COPD in his brother; Cancer in his brother and brother; Heart disease in his father and mother.  ROS:   Review of Systems  Constitutional: Negative.   HENT: Negative.    Eyes: Negative.   Respiratory:  Positive for shortness of breath. Negative for cough, hemoptysis, sputum production and wheezing.        See HPI.  Cardiovascular: Negative.   Gastrointestinal: Negative.   Genitourinary: Negative.   Musculoskeletal: Negative.   Skin: Negative.   Neurological:  Positive for dizziness. Negative for tingling, tremors, sensory change, speech change, focal weakness, seizures, loss of consciousness, weakness and headaches.       See HPI.  Endo/Heme/Allergies: Negative.   Psychiatric/Behavioral: Negative.      Please see the history of present illness.    All other systems reviewed and are negative.  EKGs/Labs/Other Studies Reviewed:    The following studies were reviewed today:   EKG:  EKG is ordered today.  The ekg ordered today demonstrates SR, 65 bpm, occasional PVC's, LBBB, otherwise nothing acute.   2D echocardiogram on June 14, 2021: 1. Left ventricular ejection fraction, by estimation, is 55 to 60%. The  left ventricle has normal function. The left ventricle has no regional  wall motion abnormalities. The left ventricular internal cavity size was  moderately dilated. There is  mild  left ventricular hypertrophy.  Left ventricular diastolic parameters are  consistent with Grade I diastolic dysfunction (impaired relaxation).  Elevated left atrial pressure.   2. Right ventricular systolic function is normal. The right ventricular  size is normal.   3. Left atrial size was mildly dilated.   4. The mitral valve is normal in structure. Trivial mitral valve  regurgitation. No evidence of mitral stenosis.   5. The aortic valve is calcified. Aortic valve regurgitation is not  visualized. No aortic stenosis is present.   6. The inferior vena cava is normal in size with greater than 50%  respiratory variability, suggesting right atrial pressure of 3 mmHg.   Left heart cath and coronary angiography on August 27, 2016: Mid RCA lesion, 85 %stenosed. Dist RCA lesion, 90 %stenosed. 3rd RPLB lesion, 85 %stenosed. Mid LAD lesion, 20 %stenosed.   Significant multilevel lesion stenoses of 85% of the mid RCA, 90% in the region of the acute margin, and 85-90% distally.   Patent previously placed Cypher stent in the mid LAD with residual narrowing of 20%.   Normal ramus intermediate and left circumflex coronary arteries.   LVEDP 16 mm Hg.   The patient developed significant spasm during the radial procedure.   RECOMMENDATION: Plan will be to perform PCI of his RCA.  However, the patient has a history of prior Plavix allergy and  reportedly, there is a remote history of a very small previous hemorrhagic CVA, making him not a candidate for Effient and possibly not a candidate for Brilinta.  Ticlid which had been used prior to Plavix therapy is no longer available in the [place] market.  I will discuss the antiplatelet strategy with colleagues prior to final recommendations.  Recent Labs: 09/19/2022: ALT 15; BUN 30; Creatinine, Ser 2.70; Hemoglobin 11.4; Magnesium 1.6; Platelets 252; Potassium 5.7; Sodium 140; TSH 2.030  Recent Lipid Panel    Component Value Date/Time   CHOL 111  08/20/2021 1404   TRIG 74 08/20/2021 1404   TRIG 114 12/18/2006 1228   HDL 45 08/20/2021 1404   CHOLHDL 2.5 08/20/2021 1404   CHOLHDL 2.5 10/03/2016 0859   VLDL 18 10/03/2016 0859   LDLCALC 51 08/20/2021 1404     Risk Assessment/Calculations:    CHA2DS2-VASc Score = 8  This indicates a 10.8% annual risk of stroke. The patient's score is based upon: CHF History: 1 HTN History: 1 Diabetes History: 1 Stroke History: 2 Vascular Disease History: 1 Age Score: 2 Gender Score: 0    Physical Exam:    VS:  BP (!) 132/52 (BP Location: Left Arm, Patient Position: Sitting, Cuff Size: Normal)   Pulse 65   Ht 5' 8.5" (1.74 m)   Wt 240 lb (108.9 kg)   BMI 35.96 kg/m     Wt Readings from Last 3 Encounters:  09/19/22 240 lb (108.9 kg)  05/04/21 257 lb (116.6 kg)  07/17/17 242 lb (109.8 kg)     GEN: Obese, 81 y.o. Caucasian male in no acute distress HEENT: Normal NECK: No JVD; No carotid bruits, healed incision site along left carotid site CARDIAC: S1/S2, regular rhythm with occasional early beats, no murmurs, rubs, gallops; 2+ peripheral pulses throughout, strong and equal bilaterally RESPIRATORY:  Clear and diminished to auscultation without rales, wheezing or rhonchi  MUSCULOSKELETAL:  No edema; No deformity  SKIN: Warm and dry NEUROLOGIC:  Alert and oriented x 3 PSYCHIATRIC:  Normal affect   ASSESSMENT:    1. Dizziness   2. Pre-syncope   3. Other fatigue   4.  Chronic diastolic CHF (congestive heart failure) ([place])   5. Coronary artery disease involving native heart without angina pectoris, unspecified vessel or lesion type   6. Hyperlipidemia, unspecified hyperlipidemia type   7. Bilateral carotid artery stenosis   8. PAF (paroxysmal atrial fibrillation) ([place])   9. History of stroke   10. Essential hypertension, benign    PLAN:    In order of problems listed above:  Dizziness, Pre-syncope, other fatigue 1 recent episode that lasted for about 10 minutes, was  relieved after resting in his chair (see HPI).  Denies any LOC, stroke-like symptoms, vertigo, or falls.  We will arrange 14-day ZIO monitor, update bilateral carotid duplex, and obtain the following blood work today: CBC, CMET, magnesium, TSH.   Chronic diastolic CHF Euvolemic and well compensated on exam.  Echocardiogram 04/18/2021 revealed LVEF 55 to 60%, mild LVH, and grade 1 diastolic dysfunction.  Weight is consistently stable. Low sodium diet, fluid restriction <2L, and daily weights encouraged. Educated to contact our office for weight gain of 2 lbs overnight or 5 lbs in one week.  We will obtain blood work as mentioned above.  CAD, s/p DES to LAD in 2008 and DES X 2 to RCA in 2017, hyperlipidemia with LDL goal < 70 Stable with no anginal symptoms. No indication for ischemic evaluation.  Continue aspirin daily, Lipitor daily, Imdur daily, lisinopril daily, and Ranexa daily.  We will stop atenolol and switch to metoprolol succinate 25 mg daily.  We will obtain blood work as mentioned above.  We will obtain fasting lipid panel at next follow-up visit.   Bilateral carotid artery disease, s/p left endarterectomy  Most recent carotid duplex from [facility] in 2021 revealed right proximal ICA that demonstrated 1 to 49% stenosis the right vertebral artery demonstrated atypical flow.  The right subclavian artery demonstrate greater than 50% stenosis.  There was a 14 mmHg difference between the brachial artery blood pressures.  Left proximal internal carotid artery demonstrates significant stenosis, status post CEA.  The left vertebral artery demonstrated antegrade flow.  Findings were stable from previous exam in 2020.  No carotid bruits noted on exam today.  Dr. Stanford [name] previously mentioned at last OV that patient would need a follow-up carotid Doppler November 2022.  Will update carotid Doppler.  Paroxysmal Afib, not on [facility] He is sinus rhythm on exam, occasional PVCs noted on exam and this was seen  on EKG today.  Has had a watchman placed, therefore not on long-term see.  Continue aspirin 81 mg daily.  We will arrange 14-day ZIO monitor.  Hx of CVA History of recent dizzy episode.  See #1 above.  Stated this is the symptom he experienced with previous stroke.  Denied any strokelike symptoms, vertigo, syncope, or LOC.  Denies any deficits.  Continue current medication regimen.  Discussed ED precautions regarding FAST symptoms.  HTN Recent low normal BP readings at physical therapy.  Recent dizziness episode (see #1).  We will stop amlodipine daily.  Will stop atenolol and switch to metoprolol succinate 25 mg daily.  We will obtain the following blood work as mentioned above.  BP log given today.  Discussed to log BP daily and bring to next follow-up visit. Discussed to monitor BP at home at least 2 hours after medications and sitting for 5-10 minutes.  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  Lisinopril and Bumex on hold for 3 days and will repeat BMET next Monday, 09/23/22.  Discussed with patient to restart amlodipine 10 mg daily.  See phone log.  We will place referral for nephrology.  Disposition: Follow up with me or Almyra Deforest, PA-C in 1 month or sooner if anything changes.      Medication Adjustments/Labs and Tests Ordered: Current medicines are reviewed at length with the patient today.  Concerns regarding medicines are outlined above.  Orders Placed This Encounter  Procedures   Comprehensive metabolic panel   Magnesium   TSH   CBC   LONG TERM MONITOR (3-14 DAYS)   EKG 12-Lead   VAS US CAROTID   Meds ordered this encounter  Medications   metoprolol succinate (TOPROL XL) 25 MG 24 hr tablet    Sig: Take 1 tablet (25 mg total) by mouth daily.    Dispense:  30 tablet    Refill:  6    Patient Instructions   Medication Instructions:  STOP AMLODIPINE 10MG   STOP ATENOLOL  START METOPROLOL SUCCINATE 25MG DAILY  *If you need a refill on your cardiac medications before your next appointment, please call your pharmacy*   Lab Work: [place] If you have labs (blood work) drawn today and your tests are completely normal, you will receive your results only by:  [place] (if you have MyChart) OR A paper copy in the mail  If you have any lab test that is abnormal or we need to change your treatment, we will call you to review the results.   Testing/Procedures: Your physician has requested that you have a carotid duplex(IN DECEMBER 2023). This test is an ultrasound of the carotid arteries in your neck. It looks at blood flow through these arteries that supply the brain with blood. Allow one hour for this exam. There are no restrictions or special instructions.  Your physician has requested you wear a ZIO patch monitor for 14 days.   Follow-Up: At [facility], you and your health needs are our priority.  As part of our continuing mission to provide you with exceptional heart care, we have created designated Provider Care Teams.  These Care Teams include your primary Cardiologist (physician) and Advanced Practice Providers (APPs -  Physician Assistants and Nurse Practitioners) who all work together to provide you with the care you need, when you need it.  Your next appointment:   1 month(s)  The format for your next appointment:   In Person  Provider:   Almyra Deforest, PA-C or with Finis Bud, AGNP-C  Other Instructions TAKE AND LOG YOUR BLOOD PRESSURE  Important Information About [place]       [place] Monitor Instructions  Your physician has requested you wear a ZIO patch monitor for 14 days.  This is a single patch monitor. [employer] supplies one patch monitor per enrollment. Additional stickers are not available. Please do not apply patch if  you will be having a Nuclear Stress Test,  Echocardiogram, Cardiac CT, MRI, or Chest Xray during the period you would be wearing the  monitor. The patch cannot be worn during these tests. You cannot remove and re-apply the  ZIO XT patch monitor.  Your ZIO patch monitor will be mailed 3 day USPS to your address on file. It may take 3-5 days  to receive your monitor after you have been enrolled.  Once you have received your monitor, please review the  enclosed instructions. Your monitor  has already been registered assigning a specific monitor serial # to you.  Billing and Patient Assistance Program Information  We have supplied [employer] with any of your insurance information on file for billing purposes. [employer] offers a sliding scale Patient Assistance Program for patients that do not have  insurance, or whose insurance does not completely cover the cost of the ZIO monitor.  You must apply for the Patient Assistance Program to qualify for this discounted rate.  To apply, please call [employer] at [phone], select option 4, select option 2, ask to apply for  Patient Assistance Program. Theodore Demark will ask your household income, and how many people  are in your household. They will quote your out-of-pocket cost based on that information.  [employer] will also be able to set up a 69-month interest-free payment plan if needed.  Applying the monitor   Shave hair from upper left chest.  Hold abrader disc by orange tab. Rub abrader in 40 strokes over the upper left chest as  indicated in your monitor instructions.  Clean area with 4 enclosed alcohol pads. Let dry.  Apply patch as indicated in monitor instructions. Patch will be placed under collarbone on left  side of chest with arrow pointing upward.  Rub patch adhesive wings for 2 minutes. Remove white label marked "1". Remove the white  label marked "2". Rub patch adhesive wings for 2 additional minutes.  While looking in a mirror, press and  release button in center of patch. A small green light will  flash 3-4 times. This will be your only indicator that the monitor has been turned on.  Do not shower for the first 24 hours. You may shower after the first 24 hours.  Press the button if you feel a symptom. You will hear a small click. Record Date, Time and  Symptom in the Patient Logbook.  When you are ready to remove the patch, follow instructions on the last 2 pages of Patient  Logbook. Stick patch monitor onto the last page of Patient Logbook.  Place Patient Logbook in the blue and white box. Use locking tab on box and tape box closed  securely. The blue and white box has prepaid postage on it. Please place it in the mailbox as  soon as possible. Your physician should have your test results approximately 7 days after the  monitor has been [facility]  Call I[place]ry Parkat [phone] you have questions regarding  your ZIO XT patch monitor. Call them immediately if you see an orange light blinking on your  monitor.  If your monitor falls off in less than 4 days, contact our Monitor department at [phone]  If your monitor becomes loose or falls off after 4 days call [employer] at [phone]  suggestions on securing your monitor    Signed, EFinis Bud NP  09/20/2022 11:08 AM    [place]

## 2022-09-19 ENCOUNTER — Ambulatory Visit: Payer: Medicare Other | Attending: Physician Assistant | Admitting: Nurse Practitioner

## 2022-09-19 ENCOUNTER — Encounter: Payer: Self-pay | Admitting: Physician Assistant

## 2022-09-19 ENCOUNTER — Ambulatory Visit (INDEPENDENT_AMBULATORY_CARE_PROVIDER_SITE_OTHER): Payer: Medicare Other

## 2022-09-19 VITALS — BP 132/52 | HR 65 | Ht 68.5 in | Wt 240.0 lb

## 2022-09-19 DIAGNOSIS — Z8673 Personal history of transient ischemic attack (TIA), and cerebral infarction without residual deficits: Secondary | ICD-10-CM | POA: Diagnosis present

## 2022-09-19 DIAGNOSIS — N179 Acute kidney failure, unspecified: Secondary | ICD-10-CM | POA: Insufficient documentation

## 2022-09-19 DIAGNOSIS — I5032 Chronic diastolic (congestive) heart failure: Secondary | ICD-10-CM | POA: Diagnosis not present

## 2022-09-19 DIAGNOSIS — R5383 Other fatigue: Secondary | ICD-10-CM

## 2022-09-19 DIAGNOSIS — N1831 Chronic kidney disease, stage 3a: Secondary | ICD-10-CM

## 2022-09-19 DIAGNOSIS — I6523 Occlusion and stenosis of bilateral carotid arteries: Secondary | ICD-10-CM

## 2022-09-19 DIAGNOSIS — R42 Dizziness and giddiness: Secondary | ICD-10-CM

## 2022-09-19 DIAGNOSIS — E785 Hyperlipidemia, unspecified: Secondary | ICD-10-CM | POA: Diagnosis present

## 2022-09-19 DIAGNOSIS — I10 Essential (primary) hypertension: Secondary | ICD-10-CM

## 2022-09-19 DIAGNOSIS — I480 Paroxysmal atrial fibrillation: Secondary | ICD-10-CM | POA: Diagnosis present

## 2022-09-19 DIAGNOSIS — R55 Syncope and collapse: Secondary | ICD-10-CM

## 2022-09-19 DIAGNOSIS — I2510 Atherosclerotic heart disease of native coronary artery without angina pectoris: Secondary | ICD-10-CM

## 2022-09-19 MED ORDER — METOPROLOL SUCCINATE ER 25 MG PO TB24: 25.0000 mg | ORAL_TABLET | Freq: Every day | ORAL | 6 refills | Status: DC

## 2022-09-19 NOTE — Patient Instructions (Addendum)
Medication Instructions:  STOP AMLODIPINE '10MG'$    STOP ATENOLOL  START METOPROLOL SUCCINATE '25MG'$  DAILY  *If you need a refill on your cardiac medications before your next appointment, please call your pharmacy*   Lab Work: [place] If you have labs (blood work) drawn today and your tests are completely normal, you will receive your results only by:  [place] (if you have MyChart) OR A paper copy in the mail  If you have any lab test that is abnormal or we need to change your treatment, we will call you to review the results.   Testing/Procedures: Your physician has requested that you have a carotid duplex(IN DECEMBER 2023). This test is an ultrasound of the carotid arteries in your neck. It looks at blood flow through these arteries that supply the brain with blood. Allow one hour for this exam. There are no restrictions or special instructions.  Your physician has requested you wear a ZIO patch monitor for 14 days.   Follow-Up: At [facility], you and your health needs are our priority.  As part of our continuing mission to provide you with exceptional heart care, we have created designated Provider Care Teams.  These Care Teams include your primary Cardiologist (physician) and Advanced Practice Providers (APPs -  Physician Assistants and Nurse Practitioners) who all work together to provide you with the care you need, when you need it.  Your next appointment:   1 month(s)  The format for your next appointment:   In Person  Provider:   Almyra Deforest, PA-C or with Finis Bud, AGNP-C  Other Instructions TAKE AND LOG YOUR BLOOD PRESSURE  Important Information About Sugar       [place] Monitor Instructions  Your physician has requested you wear a ZIO patch monitor for 14 days.  This is a single patch monitor. [employer] supplies one patch monitor per enrollment. Additional stickers are not available. Please do not apply patch if you  will be having a Nuclear Stress Test,  Echocardiogram, Cardiac CT, MRI, or Chest Xray during the period you would be wearing the  monitor. The patch cannot be worn during these tests. You cannot remove and re-apply the  ZIO XT patch monitor.  Your ZIO patch monitor will be mailed 3 day USPS to your address on file. It may take 3-5 days  to receive your monitor after you have been enrolled.  Once you have received your monitor, please review the enclosed instructions. Your monitor  has already been registered assigning a specific monitor serial # to you.  Billing and Patient Assistance Program Information  We have supplied [employer] with any of your insurance information on file for billing purposes. [employer] offers a sliding scale Patient Assistance Program for patients that do not have  insurance, or whose insurance does not completely cover the cost of the ZIO monitor.  You must apply for the Patient Assistance Program to qualify for this discounted rate.  To apply, please call [employer] at [phone], select option 4, select option 2, ask to apply for  Patient Assistance Program. Theodore Demark will ask your household income, and how many people  are in your household. They will quote your out-of-pocket cost based on that information.  [employer] will also be able to set up a 19-month interest-free payment plan if needed.  Applying the monitor   Shave hair from upper left chest.  Hold abrader disc by orange tab. Rub abrader in 40 strokes over the upper  left chest as  indicated in your monitor instructions.  Clean area with 4 enclosed alcohol pads. Let dry.  Apply patch as indicated in monitor instructions. Patch will be placed under collarbone on left  side of chest with arrow pointing upward.  Rub patch adhesive wings for 2 minutes. Remove white label marked "1". Remove the white  label marked "2". Rub patch adhesive wings for 2 additional minutes.  While looking in a mirror, press and release  button in center of patch. A small green light will  flash 3-4 times. This will be your only indicator that the monitor has been turned on.  Do not shower for the first 24 hours. You may shower after the first 24 hours.  Press the button if you feel a symptom. You will hear a small click. Record Date, Time and  Symptom in the Patient Logbook.  When you are ready to remove the patch, follow instructions on the last 2 pages of Patient  Logbook. Stick patch monitor onto the last page of Patient Logbook.  Place Patient Logbook in the blue and white box. Use locking tab on box and tape box closed  securely. The blue and white box has prepaid postage on it. Please place it in the mailbox as  soon as possible. Your physician should have your test results approximately 7 days after the  monitor has been mailed back to [facility].  Call [facility] at [phone] if you have questions regarding  your ZIO XT patch monitor. Call them immediately if you see an orange light blinking on your  monitor.  If your monitor falls off in less than 4 days, contact our Monitor department at [phone].  If your monitor becomes loose or falls off after 4 days call [employer] at [phone] for  suggestions on securing your monitor

## 2022-09-19 NOTE — Progress Notes (Unsigned)
Enrolled for [employer] to mail a ZIO XT long term holter monitor to the patients address on file.   Dr. Crenshaw to read. 

## 2022-09-20 ENCOUNTER — Other Ambulatory Visit: Payer: Self-pay

## 2022-09-20 ENCOUNTER — Encounter: Payer: Self-pay | Admitting: Nurse Practitioner

## 2022-09-20 ENCOUNTER — Telehealth: Payer: Self-pay | Admitting: Nurse Practitioner

## 2022-09-20 DIAGNOSIS — Z79899 Other long term (current) drug therapy: Secondary | ICD-10-CM

## 2022-09-20 DIAGNOSIS — R7989 Other specified abnormal findings of blood chemistry: Secondary | ICD-10-CM

## 2022-09-20 LAB — COMPREHENSIVE METABOLIC PANEL
ALT: 15 IU/L (ref 0–44)
AST: 16 IU/L (ref 0–40)
Albumin/Globulin Ratio: 1.6 (ref 1.2–2.2)
Albumin: 4.1 g/dL (ref 3.7–4.7)
Alkaline Phosphatase: 98 IU/L (ref 44–121)
BUN/Creatinine Ratio: 11 (ref 10–24)
BUN: 30 mg/dL — ABNORMAL HIGH (ref 8–27)
Bilirubin Total: 0.6 mg/dL (ref 0.0–1.2)
CO2: 23 mmol/L (ref 20–29)
Calcium: 9.5 mg/dL (ref 8.6–10.2)
Chloride: 101 mmol/L (ref 96–106)
Creatinine, Ser: 2.7 mg/dL — ABNORMAL HIGH (ref 0.76–1.27)
Globulin, Total: 2.6 g/dL (ref 1.5–4.5)
Glucose: 120 mg/dL — ABNORMAL HIGH (ref 70–99)
Potassium: 5.7 mmol/L — ABNORMAL HIGH (ref 3.5–5.2)
Sodium: 140 mmol/L (ref 134–144)
Total Protein: 6.7 g/dL (ref 6.0–8.5)
eGFR: 23 mL/min/{1.73_m2} — ABNORMAL LOW (ref >59)

## 2022-09-20 LAB — CBC
Hematocrit: 34.4 % — ABNORMAL LOW (ref 37.5–51.0)
Hemoglobin: 11.4 g/dL — ABNORMAL LOW (ref 13.0–17.7)
MCH: 32.8 pg (ref 26.6–33.0)
MCHC: 33.1 g/dL (ref 31.5–35.7)
MCV: 99 fL — ABNORMAL HIGH (ref 79–97)
Platelets: 252 10*3/uL (ref 150–450)
RBC: 3.48 x10E6/uL — ABNORMAL LOW (ref 4.14–5.80)
RDW: 11.8 % (ref 11.6–15.4)
WBC: 11.9 10*3/uL — ABNORMAL HIGH (ref 3.4–10.8)

## 2022-09-20 LAB — TSH: TSH: 2.03 u[IU]/mL (ref 0.450–4.500)

## 2022-09-20 LAB — MAGNESIUM: Magnesium: 1.6 mg/dL (ref 1.6–2.3)

## 2022-09-20 MED ORDER — AMLODIPINE BESYLATE 10 MG PO TABS: 10.0000 mg | ORAL_TABLET | Freq: Every day | ORAL | 0 refills | Status: AC

## 2022-09-20 NOTE — Telephone Encounter (Signed)
Spoke with patient and updated him regarding his recent lab results from yesterday.  Kidney function has significantly declined since 1 year ago.  Updated him and let him know to hold lisinopril and Bumex for the next 3 days and restart amlodipine 10 mg daily to help control his blood pressure.  Have put in my result note that we will recheck BMET next Monday.  Have also put in my result note to place a STAT referral to nephrology.  I discussed with him on the phone that I will let him know what to do regarding these medications after I get the repeat BMET lab work results.  I have sent him a MyChart message regarding this conversation per his request.  He states he is feeling well, denies any complaints.  Discussed ED precautions with him. He verbalized understanding of our phone call conversation and was appreciative of my call.  Finis Bud, NP

## 2022-09-23 ENCOUNTER — Other Ambulatory Visit: Payer: Self-pay

## 2022-09-23 DIAGNOSIS — Z79899 Other long term (current) drug therapy: Secondary | ICD-10-CM

## 2022-09-24 ENCOUNTER — Other Ambulatory Visit: Payer: Self-pay

## 2022-09-24 ENCOUNTER — Telehealth: Payer: Self-pay | Admitting: Nurse Practitioner

## 2022-09-24 ENCOUNTER — Telehealth: Payer: Self-pay

## 2022-09-24 DIAGNOSIS — E875 Hyperkalemia: Secondary | ICD-10-CM

## 2022-09-24 LAB — BASIC METABOLIC PANEL
BUN/Creatinine Ratio: 13 (ref 10–24)
BUN: 30 mg/dL — ABNORMAL HIGH (ref 8–27)
CO2: 24 mmol/L (ref 20–29)
Calcium: 9.7 mg/dL (ref 8.6–10.2)
Chloride: 102 mmol/L (ref 96–106)
Creatinine, Ser: 2.39 mg/dL — ABNORMAL HIGH (ref 0.76–1.27)
Glucose: 98 mg/dL (ref 70–99)
Potassium: 6 mmol/L (ref 3.5–5.2)
Sodium: 141 mmol/L (ref 134–144)
eGFR: 27 mL/min/{1.73_m2} — ABNORMAL LOW (ref >59)

## 2022-09-24 NOTE — Telephone Encounter (Signed)
S/W patient this morning updated regarding potassium level 6.0 from 5.7.  Consulted clinical pharmacist, Tommy Medal, who recommended Lokelma 10 g twice daily x 48 hours.  Discussed with patient to hold the following medications: Bumex, lisinopril, metformin.  I discussed he may want to be on short acting insulin instead of metformin in the meantime.  Nephrology referral still pending.  We will repeat BMET this Thursday and have routed this result note to [facility] triage.  He verbalized understanding of our conversation and was appreciative of my call.   Finis Bud, NP

## 2022-09-24 NOTE — Telephone Encounter (Signed)
Received a call from [employer] calling to report potasium done 09/24/23  6.0.Advised already taken care of.

## 2022-09-25 ENCOUNTER — Encounter (HOSPITAL_COMMUNITY): Payer: Medicare Other

## 2022-09-25 DIAGNOSIS — R42 Dizziness and giddiness: Secondary | ICD-10-CM

## 2022-09-27 ENCOUNTER — Encounter (HOSPITAL_BASED_OUTPATIENT_CLINIC_OR_DEPARTMENT_OTHER): Payer: Self-pay

## 2022-09-27 LAB — BASIC METABOLIC PANEL
BUN/Creatinine Ratio: 12 (ref 10–24)
BUN: 26 mg/dL (ref 8–27)
CO2: 22 mmol/L (ref 20–29)
Calcium: 9.5 mg/dL (ref 8.6–10.2)
Chloride: 104 mmol/L (ref 96–106)
Creatinine, Ser: 2.25 mg/dL — ABNORMAL HIGH (ref 0.76–1.27)
Glucose: 107 mg/dL — ABNORMAL HIGH (ref 70–99)
Potassium: 4.8 mmol/L (ref 3.5–5.2)
Sodium: 143 mmol/L (ref 134–144)
eGFR: 29 mL/min/{1.73_m2} — ABNORMAL LOW (ref >59)

## 2022-10-07 ENCOUNTER — Telehealth: Payer: Self-pay | Admitting: Nurse Practitioner

## 2022-10-07 NOTE — Telephone Encounter (Signed)
Called and s/w patient. Says he is doing well now. Did have some chest pains for the past few weeks at various times, says he thought it was his angina. Upon reviewing his medications at home, he realized he was only taking Ranexa once per day. Says he recently started taking it twice per day and hasn't had any chest pains since. BP is well controlled at home. SBP averaging 120's to 130's. Denies any signs or symptoms of CHF exacerbation. Does admit to congestion and coughing up clear sputum, denies any fever, chills, nausea, vomiting. Thinks it may be seasonal allergy related. I advised him to go to PCP and see if they can start Claritin for him.   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 Says he has continued to hold lisinopril, Bumex, and Metformin. Says blood sugar has been well controlled at home. I advised him to f/u with his PCP and Nephrology doctor regarding these medications.   Low sodium diet, fluid restriction <2L, and daily weights encouraged. Educated to contact our office for weight gain of 2 lbs overnight or 5 lbs in one week. He verbalized understanding.   I told him we will see him back in the office on 10/19. He verbalized understanding of our conversation and was appreciative of my call.   Finis Bud, NP

## 2022-10-16 NOTE — Progress Notes (Incomplete)
Cardiology Office Note:    Date:  10/17/2022   ID:  [name], DOB 10-Jun-1941, MRN [id number]  PCP:  Dagoberto Reef, MD   [facility] Prov[facility] Cardiologist:  Kirk Ruths, MD     Referring MD: Dagoberto Reef, MD   CC: Here for follow-up   History of Present Illness:    [name] is a 81 y.o. 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 Stanford Breed on August 20, 2021.  History of CAD and A-fib.  Status post drug-eluting stent to LAD in 2008 and drug-eluting stent to the RCA x2 in 2017.  Had a intracranial hemorrhage (cerebellar hematoma) in 2003.  Did not require evacuation.  Abdominal Ultrasound in 2016 did not reveal aneurysm.  Cath in 2017 showed 85% mid RCA, 90% distal RCA, 85% RPLB.  He had 2 drug-eluting stents placed to RCA.  Nuclear medicine stress test in 2019 showed EF 57%, prior infarct inferolaterally with mild peri-infarct ischemia.  Had a Watchman device placed in 2021.  TEE done at [facility] in 2021 revealed normal LV function, left atrial enlargement with Watchman device completely occluding left atrial appendage, as well as mild right atrial enlargement, mild MR, mild aortic insufficiency.  Had carotid Dopplers done in 2021 at [facility] that showed a 1 to 49% right stenosis with right subclavian stenosis > 50%, left carotid endarterectomy site was patent.  Echo in June 2022 showed normal LV function, mild LVH, moderate left ventricular enlargement, grade 1 diastolic dysfunction, and mild left atrial enlargement.  At last office visit with Dr. Stanford Breed, he reported shortness of breath with more extreme activities but not with regular everyday activities.  Shortness of  breath was relieved at rest.  Did not have any associated chest pain.  Denied any exertional chest pain, syncope, palpitations, orthopnea, or PND.  He had chronic mild pedal edema.   I last saw him on 09/19/22 with CC of weak legs, low DBP readings.  Stopped some of his medications.  Dr. Stanford Breed has stated okay to hold amlodipine and to continue diuretic and atenolol.  Reported dizzy spell that lasted approximately 10 minutes, episode went away and he felt fine later on.  Has been participating in PT for leg strengthening.  However, PT was canceled due to low blood pressure readings.  Did report mild, chronic shortness of breath, unable to walk long distances.  Denied any other cardiac complaints or issues.    Today he presents for follow-up.  He states he has been holding metformin, lisinopril, and Bumex as discussed.  He show says BP log which is overall well-controlled BP at home.  Blood sugars well controlled.  Most specifically, he admits to episodes of angina, occurs when walking to kitchen in his house.  He is taking his antianginals including Ranexa twice daily, Imdur, and amlodipine.  Says he has had it several times in the last month described as dull, left-sided, intermittent, goes away with rest, rated 3-4 on 0-10 pain scale.  Last a few minutes in duration and occurs approximately 2-3 times per week.  Says it seems to be a little bit better than previous.  He said he had some chest pain  with walking into clinic today and, and requested to be in wheelchair to rest, and has now been relieved.  Does admit to some lower extremity swelling, has recently put on compression stockings that are helping.  Does state he will be seeing nephrology next month with [name].  He is also scheduled for cataract surgery next month and will be seen ophthalmology for preop evaluation on November 6.  I discussed with him that we have not received official clearance forms from office and will need to  be sent to our office.  Continues to have mild, chronic shortness of breath that has improved since last follow-up visit.  Denies palpitations, syncope, presyncope, dizziness, orthopnea, PND, or claudication. Denies any other questions or concerns today.  He is a retired Medical illustrator.   Past Medical History:  Diagnosis Date   Acute diastolic congestive heart failure ([place]) 09/20/2015   Arthritis    "fingers" (08/27/2016)   Bilateral carotid artery disease (HCC)    Status post left endarterectomy   CAD (coronary artery disease)    a. 06/2007 PCI LAD->Cypher DES;  b. 02/2010 Cath LM 30-40, LAD 40-48m patent stent, D2 527 RI 774ost, LCX 50, OM 30, RCA 30-467m30d, PDA 50, RPL 40-50p, nl EF.   CAD (coronary artery disease)    Status post DES to LAD in 2008, DES x2 to RCA in 2017   Carotid artery occlusion    a. 07/2011 L CEA;  b. 02/2012 Carotid U/S: RICA 12-31-62%LICA patent.   Chronic back pain    "moves up and down my back" (08/27/2016)   Chronic bronchitis (HCC)    [facility] (CKD), [facility] ([facility]   Nephrology referral placed   [place]   "I've only had 1; hemorrhagic; don't remember specifically when" (08/27/2016)   GERD (gastroesophageal reflux disease)    Heart murmur    History of blood transfusion    "related to a surgery I think"   Hyperlipidemia    Hypertension    Kidney stones    OSA on CPAP 07/20/2015   PAF (paroxysmal atrial fibrillation) (HCC)    Not on anticoagulation due to Watchman device   Peripheral [place] disease [place]   Pneumonia    "a number of times" (08/27/2016)   Type II diabetes mellitus ([facility]    Past Surgical History:  Procedure Laterality Date   CARDIAC CATHETERIZATION  02/2010; 08/27/2016   "no stent"/notes 03/28/2010; unable to put stent in cause of spasms"   CARDIAC CATHETERIZATION N/A 08/27/2016   Procedure: Left Heart Cath and Coronary Angiography;  Surgeon: ThTroy SineMD;   [facility];  Service: Cardiovascular;  Laterality: N/A;   CARDIAC CATHETERIZATION N/A 08/28/2016   Procedure: Coronary Stent Intervention;  Surgeon: ThTroy SineMD;  [facility];  Service: Cardiovascular;  Laterality: N/A;   CAROTID ENDARTERECTOMY Left 08/23/2011   CORONARY ANGIOPLASTY WITH STENT PLACEMENT  06-30-07   CYSTOSCOPY W/ STONE MANIPULATION     INGUINAL HERNIA REPAIR Right [place] "cut me open"    Current Medications:  Current Meds  Medication Sig   albuterol (PROVENTIL HFA;VENTOLIN HFA) 108 (90 Base) MCG/ACT inhaler Inhale 2 puffs into the lungs every 6 (six) hours as needed for wheezing or shortness of breath.   aspirin EC 81 MG tablet Take 1 tablet (81 mg total) by mouth daily.  amlodipine (Norvasc) 10 mg tablet Take 1 tablet (10 mg total) by mouth daily.   atorvastatin (LIPITOR) 80 MG tablet Take 1 tablet (80 mg total) by mouth daily. Office visit needed before additional refills   bumetanide (BUMEX) 0.5 MG tablet Take 1 tablet (0.5 mg total) by mouth daily.   docusate sodium (COLACE) 100 MG capsule Take 100 mg by mouth 2 (two) times daily as needed.   Insulin Degludec (TRESIBA FLEXTOUCH [place]) Inject 48 Units into the skin daily.   isosorbide mononitrate (IMDUR) 60 MG 24 hr tablet Take 60 mg by mouth every morning.   lisinopril (ZESTRIL) 40 MG tablet TAKE 1 TABLET BY MOUTH EVERY DAY   metFORMIN (GLUCOPHAGE) 1000 MG tablet TAKE 1 TABLET BY MOUTH TWICE DAILY WITH MEALS   Metoprolol succinate (Toprol XL) 25 mg tablet Take 1 tablet (25 mg total) by mouth daily.   pantoprazole (PROTONIX) 40 MG tablet Take 1 tablet (40 mg total) by mouth daily.   ranolazine (RANEXA) 500 MG 12 hr tablet Take 500 mg by mouth 2 (two) times daily.     Allergies:   Clopidogrel bisulfate and Adhesive [tape]   Social History   Socioeconomic History   Marital status: Widowed    Spouse name: Not on file   Number of children: Not on  file   Years of education: Not on file   Highest education level: Not on file  Occupational History   Occupation: Medical illustrator  Tobacco Use   Smoking status: Never   Smokeless tobacco: Never  Substance and Sexual Activity   Alcohol use: No    Alcohol/week: 0.0 standard drinks of alcohol   Drug use: No   Sexual activity: Not Currently  Other Topics Concern   Not on file  Social History Narrative   Works part time as an Medical illustrator.   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    One son who lives in [place] - son is adopted.    She takes care of his wife, she is oxygen continuously.    Social Determinants of Health   Financial Resource Strain: Not on file  Food Insecurity: Not on file  Transportation Needs: Not on file  Physical Activity: Not on file  Stress: Not on file  Social Connections: Not on file     Family History: The patient's family history includes COPD in his brother; Cancer in his brother and brother; Heart disease in his father and mother.  ROS:   Review of Systems  Constitutional: Negative.   HENT: Negative.    Eyes: Negative.   Respiratory:  Positive for shortness of breath. Negative for cough, hemoptysis, sputum production and wheezing.        See HPI.   Cardiovascular:  Positive for chest pain and leg swelling. Negative for palpitations, orthopnea, claudication and PND.       See HPI.   Gastrointestinal: Negative.   Genitourinary: Negative.   Musculoskeletal: Negative.   Skin: Negative.   Neurological: Negative.   Endo/Heme/Allergies: Negative.   Psychiatric/Behavioral: Negative.     Please see the history of present illness.    All other systems reviewed and are negative.  EKGs/Labs/Other Studies Reviewed:    The following studies were reviewed today:   EKG:  EKG is ordered today.  The ekg ordered today demonstrates SR, 80 bpm with PACs, chronic LBBB, otherwise nothing acute.   14 day Zio monitor:  Has not been read yet   2D echocardiogram on June 14, 2021: 1. Left ventricular  ejection fraction, by estimation, is 55 to 60%. The  left ventricle has normal function. The left ventricle has no regional  wall motion abnormalities. The left ventricular internal cavity size was  moderately dilated. There is mild  left ventricular hypertrophy. Left ventricular diastolic parameters are  consistent with Grade I diastolic dysfunction (impaired relaxation).  Elevated left atrial pressure.   2. Right ventricular systolic function is normal. The right ventricular  size is normal.   3. Left atrial size was mildly dilated.   4. The mitral valve is normal in structure. Trivial mitral valve  regurgitation. No evidence of mitral stenosis.   5. The aortic valve is calcified. Aortic valve regurgitation is not  visualized. No aortic stenosis is present.   6. The inferior vena cava is normal in size with greater than 50%  respiratory variability, suggesting right atrial pressure of 3 mmHg.   Left heart cath and coronary angiography on August 27, 2016: Mid RCA lesion, 85 %stenosed. Dist RCA lesion, 90 %stenosed. 3rd RPLB lesion, 85 %stenosed. Mid LAD lesion, 20 %stenosed.   Significant multilevel lesion stenoses of 85% of the mid RCA, 90% in the region of the acute margin, and 85-90% distally.   Patent previously placed Cypher stent in the mid LAD with residual narrowing of 20%.   Normal ramus intermediate and left circumflex coronary arteries.   LVEDP 16 mm Hg.   The patient developed significant spasm during the radial procedure.   RECOMMENDATION: Plan will be to perform PCI of his RCA.  However, the patient has a history of prior Plavix allergy and  reportedly, there is a remote history of a very small previous hemorrhagic CVA, making him not a candidate for Effient and possibly not a candidate for Brilinta.  Ticlid which had been used prior to Plavix therapy is no longer available in the [place] market.  I will discuss the antiplatelet strategy with  colleagues prior to final recommendations.  Recent Labs: 09/19/2022: ALT 15; Hemoglobin 11.4; Magnesium 1.6; Platelets 252; TSH 2.030 09/26/2022: BUN 26; Creatinine, Ser 2.25; Potassium 4.8; Sodium 143  Recent Lipid Panel    Component Value Date/Time   CHOL 111 08/20/2021 1404   TRIG 74 08/20/2021 1404   TRIG 114 12/18/2006 1228   HDL 45 08/20/2021 1404   CHOLHDL 2.5 08/20/2021 1404   CHOLHDL 2.5 10/03/2016 0859   VLDL 18 10/03/2016 0859   LDLCALC 51 08/20/2021 1404     Risk Assessment/Calculations:    CHA2DS2-VASc Score = 8  This indicates a 10.8% annual risk of stroke. The patient's score is based upon: CHF History: 1 HTN History: 1 Diabetes History: 1 Stroke History: 2 Vascular Disease History: 1 Age Score: 2 Gender Score: 0    Physical Exam:    VS:  BP (!) 132/46 (BP Location: Left Arm, Patient Position: Sitting, Cuff Size: Normal)   Pulse 80   Ht 5' 8.5" (1.74 m)   Wt 241 lb (109.3 kg)   BMI 36.11 kg/m     Wt Readings from Last 3 Encounters:  10/17/22 241 lb (109.3 kg)  09/19/22 240 lb (108.9 kg)  05/04/21 257 lb (116.6 kg)     GEN: Obese, 81 y.o. Caucasian male in no acute distress, sitting in wheelchair today HEENT: Normal NECK: No JVD; Carotid bruit noted along right carotid, no carotid bruit noted on left carotid, healed incision site along left carotid site CARDIAC: S1/S2, regular rhythm with occasional early beats, no murmurs, rubs, gallops; 2+ peripheral pulses throughout, strong and  equal bilaterally RESPIRATORY:  Clear and diminished to auscultation without rales, wheezing or rhonchi  MUSCULOSKELETAL:  BLE (appears 2+) wearing compression stockings bilaterally; No deformity  SKIN: Warm and dry NEUROLOGIC:  Alert and oriented x 3 PSYCHIATRIC:  Normal affect   ASSESSMENT:    1. Chest pain of uncertain etiology   2. Coronary artery disease involving native heart with angina pectoris, unspecified vessel or lesion type ([place])   3. Hyperlipidemia LDL  goal <70   4. Bilateral carotid artery stenosis   5. Chronic diastolic CHF (congestive heart failure) ([place])   6. Dizziness   7. Pre-syncope   8. Fatigue, unspecified type   9. PAF (paroxysmal atrial fibrillation) ([place])   10. Hypertension, unspecified type   11. CKD (chronic kidney disease) stage 4, GFR 15-29 ml/min (HCC)   12. History of stroke   70. Paroxysmal A-fib ([place])   14. Bilateral carotid artery disease, unspecified type ([place])     PLAN:    In order of problems listed above:  CAD, s/p DES to LAD in 2008 and DES x 2 to RCA in 2017, chest pain of uncertain etiology Admits to mild, exertional chest pain, noticed when walking to kitchen to get some ice.  Relieved with rest.  EKG today did not reveal any acute ischemic changes.  Despite taking his daily antianginal medications, he continues to have chest pain and had some as he walked in the office this afternoon.  We will initiate nitroglycerin as needed for chest pain and increase Toprol-XL to 50 mg daily for antianginal effect and for better blood pressure control.  ED precautions discussed.  He is not a candidate for coronary CTA, and he is agreeable to repeat [place].  Last stress test in 2019 showed prior infarct inferolaterally with mild peri-infarct ischemia.  EF 57%.  Risk and benefits discussed as outlined below, and he is agreeable to proceed.  He is due for repeat fasting lipid panel and liver function test, this will be arranged in 2 weeks from now.  Continue current medication regimen. Heart healthy diet and regular cardiovascular exercise as tolerated encouraged.   Shared Decision Making/Informed Consent The risks [chest pain, shortness of breath, cardiac arrhythmias, dizziness, blood pressure fluctuations, myocardial infarction, stroke/transient ischemic attack, nausea, vomiting, allergic reaction, radiation exposure, metallic taste sensation and life-threatening complications (estimated to be 1 in 10,000)], benefits (risk  stratification, diagnosing coronary artery disease, treatment guidance) and alternatives of a nuclear stress test were discussed in detail with Mr. Pharmacist, community and he agrees to proceed.  2. HLD with LDL goal < 70 He is due for labs to be checked.  We will arrange FLP and LFT to be drawn in 2 weeks from now. Heart healthy diet and regular cardiovascular exercise encouraged.  Continue current medication regimen.  3. Bilateal carotid artery disease, s/p left endarterectomy Carotid duplex  in 2021 revealed right proximal ICA that demonstrated 1 to 49% stenosis the right vertebral artery demonstrated atypical flow.  The right subclavian artery demonstrate greater than 50% stenosis.  There was a 14 mmHg difference between the brachial artery blood pressures.  Left proximal internal carotid artery demonstrates significant stenosis, status post CEA.  The left vertebral artery demonstrated antegrade flow.  Findings were stable from previous exam in 2020.  Soft, carotid bruit noted along right carotid, no carotid bruit noted on the left carotid.  Carotid Doppler already scheduled for December 2023.  Continue current medication regimen.  4. Chronic diastolic CHF Echo in 3748 revealed EF 55  to 60%, mild LVH with grade 1 DD.  Does note some swelling to BLE, overall well compensated, not volume overloaded.  Weight stable.  We will obtain BMET today.  If kidney function remains stable, will switch to Lasix for diuretic instead of Bumex.  Bumex currently on hold until we get results of BMET.  Patient verbalizes understanding.  Increase metoprolol succinate as mentioned above.  Low sodium diet, fluid restriction <2L, and daily weights encouraged. Educated to contact our office for weight gain of 2 lbs overnight or 5 lbs in one week.  Encouraged him to continue wear compression stockings during the day.  We will discontinue lisinopril due to history of CKD stage IV as mentioned below.   5. Dizziness, pre-syncope, other  fatigue No recurrent symptoms.  14-day ZIO monitor has not been read yet.  Updating bilateral carotid Doppler in December as mentioned above.  We will obtain BMET today.  6. PAF, not on AC He is in sinus rhythm today.  Denies any tachycardia or palpitations.  Not on anticoagulation due to having a watchman placed.  Continue aspirin 81 mg daily.  Waiting on 14-day ZIO monitor to be read.  Increasing metoprolol succinate as mentioned above.  7. HTN BP today, 132/46.  BP overall well controlled at home.  Have room to increase metoprolol succinate as mentioned above.  We will discontinue lisinopril due to history of CKD stage IV and recent history of hyperkalemia with that.  We will obtain BMET today and hold Bumex at this time.  Continue amlodipine.   8. CKD stage 4 Most recent serum creatinine 2.25 with eGFR 29.  We will discontinue metformin from medication list as well as lisinopril, as this also cause hyperkalemia for patient.  Bumex currently on hold.  He will follow-up with nephrology next month, appreciate input.  We will obtain BMET today, and if kidney function is stable, plan to switch Bumex to Lasix.   9. Hx of CVA Denies any deficits, strokelike symptoms, or new or worsening symptoms.  Continue current medication regimen.  Discussed ED precautions regarding FAS T symptoms.  10. Disposition: Follow up with APP in 3-4 weeks or sooner if anything changes.  He will be following up with ophthalmology for cataract surgery for preop evaluation next month.  I recommended that he have his ophthalmology office fax over official clearance paperwork.  Once preop clearance paperwork has been faxed over, patient will need preop eval at next follow-up.  He is interested in PT therapy and help with his mobility at home with climbing up stairs, sounds like he might need a nurse to go out to do a home health assessment.  Will be referring patient for social work services.    Medication Adjustments/Labs and  Tests Ordered: Current medicines are reviewed at length with the patient today.  Concerns regarding medicines are outlined above.  Orders Placed This Encounter  Procedures   Lipid panel   Hepatic function panel   Basic metabolic panel   Cardiac Stress Test: Informed Consent Details: Physician/Practitioner Attestation; Transcribe to consent form and obtain patient signature   MYOCARDIAL PERFUSION IMAGING   EKG 12-Lead   Meds ordered this encounter  Medications   nitroGLYCERIN (NITROSTAT) 0.4 MG SL tablet    Sig: Place 1 tablet (0.4 mg total) under the tongue every 5 (five) minutes as needed for chest pain.    Dispense:  25 tablet    Refill:  2   metoprolol succinate (TOPROL XL) 50 MG 24 hr  tablet    Sig: Take 1 tablet (50 mg total) by mouth daily.    Dispense:  90 tablet    Refill:  3    Dose change new Rx    Patient Instructions  Medication Instructions:  START Nitro as needed for chest pain. DO NOT use more than 3 tablets in an episode.  INCREASE Metoprolol Succinate (Toprol-XL) to 50 mg daily  STOP Lisinopril  STOP Metformin HOLD Bumex until further notice   *If you need a refill on your cardiac medications before your next appointment, please call your pharmacy*  Lab Work: Finis Bud, NP  recommends that you return for lab work in:  [facility]  Finis Bud, NP recommends that you return for lab work in 3-4 weeks:  Fasting Lipid Panel-DO NOT eat or drink past midnight Hepatic (Liver) Function Test   If you have labs (blood work) drawn today and your tests are completely normal, you will receive your results only by: [place] (if you have MyChart) OR A paper copy in the mail If you have any lab test that is abnormal or we need to change your treatment, we will call you to review the results.  Testing/Procedures: Finis Bud, NP has ordered a Myocardial Perfusion Imaging Study.   The test will take approximately 3 to 4 hours to complete; you may bring  reading material.  If someone comes with you to your appointment, they will need to remain in the main lobby due to limited space in the testing area. **If you are pregnant or breastfeeding, please notify the nuclear lab prior to your appointment**  How to prepare for your Myocardial Perfusion Test: Do not eat or drink 3 hours prior to your test, except you may have water. Do not consume products containing caffeine (regular or decaffeinated) 12 hours prior to your test. (ex: coffee, chocolate, sodas, tea). Do wear comfortable clothes (no dresses or overalls) and walking shoes, tennis shoes preferred (No heels or open toe shoes are allowed). Do NOT wear cologne, perfume, aftershave, or lotions (deodorant is allowed). If these instructions are not followed, your test will have to be rescheduled.  Please schedule for 2-3 weeks   Follow-Up: At [facility], you and your health needs are our priority.  As part of our continuing mission to provide you with exceptional heart care, we have created designated Provider Care Teams.  These Care Teams include your primary Cardiologist (physician) and Advanced Practice Providers (APPs -  Physician Assistants and Nurse Practitioners) who all work together to provide you with the care you need, when you need it.  Your next appointment:   3-4 week(s)  The format for your next appointment:   In Person  Provider:   APP        Other Instructions  Important Information About Sugar          Signed, Finis Bud, NP  10/17/2022 8:44 PM    [place]

## 2022-10-17 ENCOUNTER — Ambulatory Visit: Payer: Medicare Other | Attending: Physician Assistant | Admitting: Nurse Practitioner

## 2022-10-17 ENCOUNTER — Encounter: Payer: Self-pay | Admitting: Nurse Practitioner

## 2022-10-17 VITALS — BP 132/46 | HR 80 | Ht 68.5 in | Wt 241.0 lb

## 2022-10-17 DIAGNOSIS — I10 Essential (primary) hypertension: Secondary | ICD-10-CM

## 2022-10-17 DIAGNOSIS — R079 Chest pain, unspecified: Secondary | ICD-10-CM | POA: Diagnosis not present

## 2022-10-17 DIAGNOSIS — I5032 Chronic diastolic (congestive) heart failure: Secondary | ICD-10-CM | POA: Insufficient documentation

## 2022-10-17 DIAGNOSIS — I6523 Occlusion and stenosis of bilateral carotid arteries: Secondary | ICD-10-CM | POA: Diagnosis not present

## 2022-10-17 DIAGNOSIS — R55 Syncope and collapse: Secondary | ICD-10-CM | POA: Insufficient documentation

## 2022-10-17 DIAGNOSIS — I480 Paroxysmal atrial fibrillation: Secondary | ICD-10-CM | POA: Insufficient documentation

## 2022-10-17 DIAGNOSIS — R42 Dizziness and giddiness: Secondary | ICD-10-CM | POA: Diagnosis present

## 2022-10-17 DIAGNOSIS — I779 Disorder of arteries and arterioles, unspecified: Secondary | ICD-10-CM | POA: Insufficient documentation

## 2022-10-17 DIAGNOSIS — I25119 Atherosclerotic heart disease of native coronary artery with unspecified angina pectoris: Secondary | ICD-10-CM | POA: Diagnosis not present

## 2022-10-17 DIAGNOSIS — N184 Chronic kidney disease, stage 4 (severe): Secondary | ICD-10-CM | POA: Diagnosis present

## 2022-10-17 DIAGNOSIS — R5383 Other fatigue: Secondary | ICD-10-CM | POA: Insufficient documentation

## 2022-10-17 DIAGNOSIS — E785 Hyperlipidemia, unspecified: Secondary | ICD-10-CM

## 2022-10-17 DIAGNOSIS — Z8673 Personal history of transient ischemic attack (TIA), and cerebral infarction without residual deficits: Secondary | ICD-10-CM | POA: Diagnosis present

## 2022-10-17 MED ORDER — NITROGLYCERIN 0.4 MG SL SUBL: 0.4000 mg | SUBLINGUAL_TABLET | SUBLINGUAL | 2 refills | Status: AC | PRN

## 2022-10-17 MED ORDER — METOPROLOL SUCCINATE ER 50 MG PO TB24: 50.0000 mg | ORAL_TABLET | Freq: Every day | ORAL | 3 refills | Status: AC

## 2022-10-17 NOTE — Patient Instructions (Addendum)
Medication Instructions:  START Nitro as needed for chest pain. DO NOT use more than 3 tablets in an episode.  INCREASE Metoprolol Succinate (Toprol-XL) to 50 mg daily  STOP Lisinopril  STOP Metformin HOLD Bumex until further notice   *If you need a refill on your cardiac medications before your next appointment, please call your pharmacy*  Lab Work: Finis Bud, NP  recommends that you return for lab work in:  [facility]  Finis Bud, NP recommends that you return for lab work in 3-4 weeks:  Fasting Lipid Panel-DO NOT eat or drink past midnight Hepatic (Liver) Function Test   If you have labs (blood work) drawn today and your tests are completely normal, you will receive your results only by: [place] (if you have MyChart) OR A paper copy in the mail If you have any lab test that is abnormal or we need to change your treatment, we will call you to review the results.  Testing/Procedures: Finis Bud, NP has ordered a Myocardial Perfusion Imaging Study.   The test will take approximately 3 to 4 hours to complete; you may bring reading material.  If someone comes with you to your appointment, they will need to remain in the main lobby due to limited space in the testing area. **If you are pregnant or breastfeeding, please notify the nuclear lab prior to your appointment**  How to prepare for your Myocardial Perfusion Test: Do not eat or drink 3 hours prior to your test, except you may have water. Do not consume products containing caffeine (regular or decaffeinated) 12 hours prior to your test. (ex: coffee, chocolate, sodas, tea). Do wear comfortable clothes (no dresses or overalls) and walking shoes, tennis shoes preferred (No heels or open toe shoes are allowed). Do NOT wear cologne, perfume, aftershave, or lotions (deodorant is allowed). If these instructions are not followed, your test will have to be rescheduled.  Please schedule for 2-3 weeks   Follow-Up: At [facility], you and your health needs are our priority.  As part of our continuing mission to provide you with exceptional heart care, we have created designated Provider Care Teams.  These Care Teams include your primary Cardiologist (physician) and Advanced Practice Providers (APPs -  Physician Assistants and Nurse Practitioners) who all work together to provide you with the care you need, when you need it.  Your next appointment:   3-4 week(s)  The format for your next appointment:   In Person  Provider:   APP        Other Instructions  Important Information About Sugar

## 2022-10-18 LAB — BASIC METABOLIC PANEL
BUN/Creatinine Ratio: 9 — ABNORMAL LOW (ref 10–24)
BUN: 15 mg/dL (ref 8–27)
CO2: 24 mmol/L (ref 20–29)
Calcium: 8.9 mg/dL (ref 8.6–10.2)
Chloride: 108 mmol/L — ABNORMAL HIGH (ref 96–106)
Creatinine, Ser: 1.75 mg/dL — ABNORMAL HIGH (ref 0.76–1.27)
Glucose: 114 mg/dL — ABNORMAL HIGH (ref 70–99)
Potassium: 5.1 mmol/L (ref 3.5–5.2)
Sodium: 141 mmol/L (ref 134–144)
eGFR: 39 mL/min/{1.73_m2} — ABNORMAL LOW (ref >59)

## 2022-10-19 ENCOUNTER — Other Ambulatory Visit (HOSPITAL_BASED_OUTPATIENT_CLINIC_OR_DEPARTMENT_OTHER): Payer: Self-pay | Admitting: Nurse Practitioner

## 2022-10-19 DIAGNOSIS — I5032 Chronic diastolic (congestive) heart failure: Secondary | ICD-10-CM

## 2022-10-19 MED ORDER — FUROSEMIDE 20 MG PO TABS: 20.0000 mg | ORAL_TABLET | Freq: Every day | ORAL | 2 refills | Status: DC

## 2022-10-21 ENCOUNTER — Other Ambulatory Visit: Payer: Self-pay

## 2022-10-21 DIAGNOSIS — Z79899 Other long term (current) drug therapy: Secondary | ICD-10-CM

## 2022-10-24 ENCOUNTER — Other Ambulatory Visit: Payer: Self-pay

## 2022-10-24 DIAGNOSIS — Z79899 Other long term (current) drug therapy: Secondary | ICD-10-CM

## 2022-10-25 ENCOUNTER — Other Ambulatory Visit: Payer: Self-pay

## 2022-10-25 DIAGNOSIS — E785 Hyperlipidemia, unspecified: Secondary | ICD-10-CM

## 2022-10-25 DIAGNOSIS — I25119 Atherosclerotic heart disease of native coronary artery with unspecified angina pectoris: Secondary | ICD-10-CM

## 2022-10-25 LAB — HEPATIC FUNCTION PANEL
ALT: 13 IU/L (ref 0–44)
AST: 19 IU/L (ref 0–40)
Albumin: 3.6 g/dL — ABNORMAL LOW (ref 3.7–4.7)
Alkaline Phosphatase: 108 IU/L (ref 44–121)
Bilirubin Total: 0.8 mg/dL (ref 0.0–1.2)
Bilirubin, Direct: 0.28 mg/dL (ref 0.00–0.40)
Total Protein: 6.5 g/dL (ref 6.0–8.5)

## 2022-10-25 LAB — LIPID PANEL
Chol/HDL Ratio: 2.8 ratio (ref 0.0–5.0)
Cholesterol, Total: 100 mg/dL (ref 100–199)
HDL: 36 mg/dL — ABNORMAL LOW (ref >39)
LDL Chol Calc (NIH): 50 mg/dL (ref 0–99)
Triglycerides: 60 mg/dL (ref 0–149)
VLDL Cholesterol Cal: 14 mg/dL (ref 5–40)

## 2022-10-30 ENCOUNTER — Telehealth (HOSPITAL_COMMUNITY): Payer: Self-pay | Admitting: *Deleted

## 2022-10-30 NOTE — Telephone Encounter (Signed)
Spoke with pt to give instructions for MPI study on 10/31/22.

## 2022-10-31 ENCOUNTER — Ambulatory Visit (HOSPITAL_COMMUNITY): Payer: Medicare Other | Attending: Nurse Practitioner

## 2022-10-31 DIAGNOSIS — R079 Chest pain, unspecified: Secondary | ICD-10-CM | POA: Diagnosis present

## 2022-10-31 LAB — MYOCARDIAL PERFUSION IMAGING
LV dias vol: 130 mL (ref 62–150)
LV sys vol: 69 mL
Nuc Stress EF: 47 %
Peak HR: 83 {beats}/min
Rest HR: 66 {beats}/min
Rest Nuclear Isotope Dose: 10.1 mCi
SDS: 2
SRS: 11
SSS: 14
ST Depression (mm): 0 mm
Stress Nuclear Isotope Dose: 32.9 mCi
TID: 0.92

## 2022-10-31 MED ORDER — TECHNETIUM TC 99M TETROFOSMIN IV KIT
32.9000 | PACK | Freq: Once | INTRAVENOUS | Status: AC | PRN
  Administered 2022-10-31: 32.9 via INTRAVENOUS

## 2022-10-31 MED ORDER — REGADENOSON 0.4 MG/5ML IV SOLN
0.4000 mg | Freq: Once | INTRAVENOUS | Status: AC
  Administered 2022-10-31: 0.4 mg via INTRAVENOUS

## 2022-10-31 MED ORDER — TECHNETIUM TC 99M TETROFOSMIN IV KIT
10.1000 | PACK | Freq: Once | INTRAVENOUS | Status: AC | PRN
  Administered 2022-10-31: 10.1 via INTRAVENOUS

## 2022-11-01 LAB — BASIC METABOLIC PANEL
BUN/Creatinine Ratio: 9 — ABNORMAL LOW (ref 10–24)
BUN: 16 mg/dL (ref 8–27)
CO2: 24 mmol/L (ref 20–29)
Calcium: 9.5 mg/dL (ref 8.6–10.2)
Chloride: 103 mmol/L (ref 96–106)
Creatinine, Ser: 1.83 mg/dL — ABNORMAL HIGH (ref 0.76–1.27)
Glucose: 158 mg/dL — ABNORMAL HIGH (ref 70–99)
Potassium: 5.1 mmol/L (ref 3.5–5.2)
Sodium: 142 mmol/L (ref 134–144)
eGFR: 37 mL/min/{1.73_m2} — ABNORMAL LOW (ref >59)

## 2022-11-01 NOTE — Progress Notes (Signed)
Thank you :)

## 2022-11-07 ENCOUNTER — Encounter: Payer: Self-pay | Admitting: Physician Assistant

## 2022-11-07 ENCOUNTER — Ambulatory Visit: Payer: Medicare Other | Attending: Physician Assistant | Admitting: Physician Assistant

## 2022-11-07 ENCOUNTER — Telehealth: Payer: Self-pay

## 2022-11-07 VITALS — BP 132/54 | HR 87 | Ht 68.0 in | Wt 244.6 lb

## 2022-11-07 DIAGNOSIS — I10 Essential (primary) hypertension: Secondary | ICD-10-CM | POA: Insufficient documentation

## 2022-11-07 DIAGNOSIS — Z8673 Personal history of transient ischemic attack (TIA), and cerebral infarction without residual deficits: Secondary | ICD-10-CM | POA: Diagnosis present

## 2022-11-07 DIAGNOSIS — I6523 Occlusion and stenosis of bilateral carotid arteries: Secondary | ICD-10-CM | POA: Diagnosis not present

## 2022-11-07 DIAGNOSIS — R9439 Abnormal result of other cardiovascular function study: Secondary | ICD-10-CM | POA: Insufficient documentation

## 2022-11-07 DIAGNOSIS — I25119 Atherosclerotic heart disease of native coronary artery with unspecified angina pectoris: Secondary | ICD-10-CM | POA: Insufficient documentation

## 2022-11-07 DIAGNOSIS — E785 Hyperlipidemia, unspecified: Secondary | ICD-10-CM | POA: Diagnosis not present

## 2022-11-07 MED ORDER — RANOLAZINE ER 1000 MG PO TB12: 1000.0000 mg | ORAL_TABLET | Freq: Two times a day (BID) | ORAL | 3 refills | Status: AC

## 2022-11-07 MED ORDER — ISOSORBIDE MONONITRATE ER 60 MG PO TB24: 90.0000 mg | ORAL_TABLET | Freq: Every morning | ORAL | 3 refills | Status: AC

## 2022-11-07 NOTE — Progress Notes (Signed)
Cardiology Office Note:    Date:  11/09/2022   ID:  [name], DOB 1941/05/13, MRN [id number]  PCP:  Dagoberto Reef, MD   [place] Prov[facility] Cardiologist:  Kirk Ruths, MD     Referring MD: Dagoberto Reef, MD   Chief Complaint  Patient presents with   Follow-up    Seen for Dr. Stanford Breed    History of Present Illness:    [name] is a 81 y.o. male with a hx of CAD s/p Cypher DES to LAD 2/70, Chronic diastolic heart failure, OSA on CPAP followed by Dr. Halford Chessman Pulmonology, HTN, HLD, CVA and PAD s/p L CEA in 8/12.  He had intracranial hemorrhage with cerebellar hematoma in 2003.  He did not require evacuation.  He had an ETT Myoview on 02/04/2015, he did have chest pain during the Myoview at peak exercise with EKG changes post exercise including downsloping ST segment in the inferolateral leads, however image was normal with no evidence of ischemia, EF 59%. He also has chronic edema and history of CVA. Abdominal ultrasound obtained in 5/16 showed no aneurysm. Echocardiogram obtained in September 2016 showed normal LV function, mild LAE. Outpatient Myoview on 01/16/2016 which showed EF 54%, no significant ST changes, small mild, fixed apical and inferior basal defect consistent with thinning, and no ischemia was noted, overall low risk study.  I last saw the patient in August 2017 for recurrent chest pain, subsequent cardiac catheterization performed on 08/27/2016 revealed 85% mid RCA lesion, 90% distal RCA lesion, 85% 3rd RPLB lesion, 20% mid LAD lesion.  Patient was taken back to the Cath Lab on 08/28/2016 and underwent DES to distal RCA and mid RCA.  Nuclear stress test in 2019 showed EF 57%, prior infarct inferolaterally with mild peri-infarct ischemia.  He had Watchman device placed in 2021.  TEE obtained at [facility] in 2021 showed normal EF, left atrial enlargement with Watchman device completely occluding left atrial appendage, mild RAE,  mild MR, mild AI.  Carotid Doppler in 2021 at [facility] showed a 1 to 39% right ICA disease, greater than 50% right subclavian stenosis, patent left carotid enterectomy site.  More recently, patient was evaluated by Finis Bud, NP on 09/19/2022 due to dizziness and weakness.  Amlodipine and atenolol were discontinued and the patient was placed on 25 mg daily of metoprolol succinate.  Recent blood work also showed significant worsening renal function, therefore patient was referred to nephrology service.  A heart monitor was placed which showed minimal heart rate 38, maximal heart rate of 214, average heart rate 64, 47 episode of SVT with longest lasting less than 3 minutes, 2.3% PVC burden, although cannot exclude short duration of atrial flutter which is not long enough to start on anticoagulation.  Most recently, patient was seen by Finis Bud again on 10/07/2022, after holding the Bumex, his renal function significantly improved, therefore Bumex was discontinued and he was only placed on 20 mg daily of Lasix.  During the visit, he also mentioned some chest discomfort as well, a Myoview was ordered on 10/31/2022 which showed finding consistent with previous MI, no evidence of ischemia, large defect of severe reduction in uptake present in the apical to basal inferior, inferolateral and inferoseptal wall which is fixed, EF 47%.  Due to significant extracardiac counts, cannot exclude artifact.  Patient presents today to discuss the recent stress test.  Talking with the patient, she he continued to have intermittent chest discomfort that typically last about  5 to 10 minutes.  He has not experienced any prolonged episode of chest pain.  Looking back at the previous stress test in 2019 at [facility], he already had evidence of infarct at the same location.  Initially we talked about possibility of doing cardiac catheterization given the fact that he continued to have 2-3 episode of chest discomfort on a weekly  basis, however he wished to do a trial of medical therapy first.  I will increase ranolazine to 1000 mg twice a day and increase Imdur to 90 mg daily.  He will follow-up in 2 months.  He is already scheduled to see a new nephrologist.  If he still having worsening chest pain 61-month we will have low threshold to recommend a cardiac catheterization.   Past Medical History:  Diagnosis Date   Acute diastolic congestive heart failure ([place] 09/20/2015   Arthritis    "fingers" (08/27/2016)   Bilateral carotid artery disease (HCC)    Status post left endarterectomy   CAD (coronary artery disease)    a. 06/2007 PCI LAD->Cypher DES;  b. 02/2010 Cath LM 30-40, LAD 40-513mpatent stent, D2 5070RI 7033st, LCX 50, OM 30, RCA 30-4053m0d, PDA 50, RPL 40-50p, nl EF.   CAD (coronary artery disease)    Status post DES to LAD in 2008, DES x2 to RCA in 2017   Carotid artery occlusion    a. 07/2011 L CEA;  b. 02/2012 Carotid U/S: RICA 1-35-18%ICA patent.   Chronic back pain    "moves up and down my back" (08/27/2016)   Chronic bronchitis (HCC)    [facility]), [facility] ([facility]  [facility]phrology referral placed   CVA (cerebral vascular accident) ([place], [place]   "I've only had 1; hemorrhagic; don't remember specifically when" (08/27/2016)   GERD (gastroesophageal reflux disease)    Heart murmur    History of blood transfusion    "related to a surgery I think"   Hyperlipidemia    Hypertension    Kidney stones    OSA on CPAP 07/20/2015   PAF (paroxysmal atrial fibrillation) (HCC)    Not on anticoagulation due to Watchman device   Peripheral vascular disease ([place]   Pneumonia    "a number of times" (08/27/2016)   Type II diabetes mellitus ([place]   Past Surgical History:  Procedure Laterality Date   CARDIAC CATHETERIZATION  02/2010; 08/27/2016   "no stent"/notes 03/28/2010; unable to put stent in cause of spasms"   CARDIAC CATHETERIZATION N/A 08/27/2016   Procedure: Left Heart  Cath and Coronary Angiography;  Surgeon: ThoTroy SineD;  Location: [facility];  Service: Cardiovascular;  Laterality: N/A;   CARDIAC CATHETERIZATION N/A 08/28/2016   Procedure: Coronary Stent Intervention;  Surgeon: ThoTroy SineD;  Location: [facility];  Service: Cardiovascular;  Laterality: N/A;   CAROTID ENDARTERECTOMY Left 08/23/2011   CORONARY ANGIOPLASTY WITH STENT PLACEMENT  06-30-07   CYSTOSCOPY W/ STONE MANIPULATION     INGUINAL HERNIA REPAIR Right [place]"cut me open"    Current Medications: Current Meds  Medication Sig   albuterol (PROVENTIL HFA;VENTOLIN HFA) 108 (90 Base) MCG/ACT inhaler Inhale 2 puffs into the lungs every 6 (six) hours as needed for wheezing or shortness of breath.   amLODipine (NORVASC) 10 MG tablet Take 1 tablet (10 mg total) by mouth daily.   aspirin EC 81 MG tablet  Take 1 tablet (81 mg total) by mouth daily.   atorvastatin (LIPITOR) 80 MG tablet Take 1 tablet (80 mg total) by mouth daily. Office visit needed before additional refills   Cholecalciferol (VITAMIN D3) 50 MCG (2000 UT) capsule Take 2,000 Units by mouth daily.   Cyanocobalamin (B-12) 3000 MCG LOZG Place 1 tablet under the tongue daily.   fluticasone (FLONASE) 50 MCG/ACT nasal spray Place 2 sprays into both nostrils daily.   furosemide (LASIX) 20 MG tablet Take 1 tablet (20 mg total) by mouth daily.   Insulin Degludec (TRESIBA FLEXTOUCH [place]) Inject 48 Units into the skin daily.   levocetirizine (XYZAL) 5 MG tablet    MAGNESIUM-OXIDE 400 (240 Mg) MG tablet    metoprolol succinate (TOPROL XL) 50 MG 24 hr tablet Take 1 tablet (50 mg total) by mouth daily.   nitroGLYCERIN (NITROSTAT) 0.4 MG SL tablet Place 1 tablet (0.4 mg total) under the tongue every 5 (five) minutes as needed for chest pain.   pantoprazole (PROTONIX) 40 MG tablet Take 1 tablet (40 mg total) by mouth daily.   spironolactone (ALDACTONE) 25 MG tablet Take 12.5 mg by mouth daily.    TRADJENTA 5 MG TABS tablet Take 5 mg by mouth daily.   [DISCONTINUED] isosorbide mononitrate (IMDUR) 60 MG 24 hr tablet Take 60 mg by mouth every morning.   [DISCONTINUED] ranolazine (RANEXA) 500 MG 12 hr tablet Take 500 mg by mouth 2 (two) times daily.   Current Facility-Administered Medications for the 11/07/22 encounter (Office Visit) with Almyra Deforest, PA  Medication   ipratropium-albuterol (DUONEB) 0.5-2.5 (3) MG/3ML nebulizer solution 3 mL     Allergies:   Clopidogrel bisulfate and Adhesive [tape]   Social History   Socioeconomic History   Marital status: Widowed    Spouse name: Not on file   Number of children: Not on file   Years of education: Not on file   Highest education level: Not on file  Occupational History   Occupation: Medical illustrator  Tobacco Use   Smoking status: Never   Smokeless tobacco: Never  Substance and Sexual Activity   Alcohol use: No    Alcohol/week: 0.0 standard drinks of alcohol   Drug use: No   Sexual activity: Not Currently  Other Topics Concern   Not on file  Social History Narrative   Works part time as an Medical illustrator.   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    One son who lives in [place] - son is adopted.    She takes care of his wife, she is oxygen continuously.    Social Determinants of Health   Financial Resource Strain: Not on file  Food Insecurity: Not on file  Transportation Needs: Not on file  Physical Activity: Not on file  Stress: Not on file  Social Connections: Not on file     Family History: The patient's family history includes COPD in his brother; Cancer in his brother and brother; Heart disease in his father and mother.  ROS:   Please see the history of present illness.     All other systems reviewed and are negative.  EKGs/Labs/Other Studies Reviewed:    The following studies were reviewed today:  Myoview 10/31/2022   Findings are consistent with prior myocardial infarction. The study is intermediate risk.   A  pharmacological stress test was performed using IV Lexiscan 0.'4mg'$  over 10 seconds performed without concurrent submaximal exercise.   No ST deviation was noted. The ECG was not diagnostic due to pharmacologic protocol.  LV perfusion is abnormal. There is no evidence of ischemia. There is evidence of infarction. Defect 1: There is a large defect with severe reduction in uptake present in the apical to basal inferior, inferolateral and inferoseptal location(s) that is fixed. There is abnormal wall motion in the defect area. Consistent with infarction.   Left ventricular function is abnormal. Global function is mildly reduced. There were multiple regional abnormalities. Nuclear stress EF: 47 %. The left ventricular ejection fraction is mildly decreased (45-54%). End diastolic cavity size is mildly enlarged. End systolic cavity size is normal.   Prior study available for comparison from 01/16/2016. There are changes compared to prior study.   Image quality affected due to significant extracardiac activity.   Abnormal study. There is significant lack of counts in the inferior wall and bordering segments. With adjacent high extracardiac counts, cannot exclude artifact. However, there appears to be wall motion abnormalities in the same segments. This is very concerning for infarct. No significant reversibility noted. Findings communicated with ordering provider. Overall at least intermediate risk study, recommend further evaluation with anatomic study such as cath if feasible given his kidney function.    EKG:  EKG is not ordered today.    Recent Labs: 09/19/2022: Hemoglobin 11.4; Magnesium 1.6; Platelets 252; TSH 2.030 10/25/2022: ALT 13 10/31/2022: BUN 16; Creatinine, Ser 1.83; Potassium 5.1; Sodium 142  Recent Lipid Panel    Component Value Date/Time   CHOL 100 10/25/2022 1128   TRIG 60 10/25/2022 1128   TRIG 114 12/18/2006 1228   HDL 36 (L) 10/25/2022 1128   CHOLHDL 2.8 10/25/2022 1128   CHOLHDL  2.5 10/03/2016 0859   VLDL 18 10/03/2016 0859   LDLCALC 50 10/25/2022 1128     Risk Assessment/Calculations:           Physical Exam:    VS:  BP (!) 132/54 (BP Location: Left Arm, Patient Position: Sitting, Cuff Size: Large)   Pulse 87   Ht '5\' 8"'$  (1.727 m)   Wt 244 lb 9.6 oz (110.9 kg)   SpO2 94%   BMI 37.19 kg/m        Wt Readings from Last 3 Encounters:  11/07/22 244 lb 9.6 oz (110.9 kg)  10/31/22 241 lb (109.3 kg)  10/17/22 241 lb (109.3 kg)     GEN:  Well nourished, well developed in no acute distress HEENT: Normal NECK: No JVD; No carotid bruits LYMPHATICS: No lymphadenopathy CARDIAC: RRR, no murmurs, rubs, gallops RESPIRATORY:  Clear to auscultation without rales, wheezing or rhonchi  ABDOMEN: Soft, non-tender, non-distended MUSCULOSKELETAL:  No edema; No deformity  SKIN: Warm and dry NEUROLOGIC:  Alert and oriented x 3 PSYCHIATRIC:  Normal affect   ASSESSMENT:    1. Abnormal stress test   2. Coronary artery disease involving native coronary artery of native heart with angina pectoris ([place])   3. Essential hypertension   4. Hyperlipidemia LDL goal <70   5. H/O: CVA (cerebrovascular accident)    PLAN:    In order of problems listed above:  Abnormal stress test: Recent Myoview obtained on 10/31/2022 came back intermediate risk, no evidence of ischemia, however there was evidence of infarction with large defect of severe reduction present in the apical to basal inferior, inferolateral and inferoseptal location which is fixed, this correlated with abnormal wall motion abnormality in the defect area consistent with prior infarction.  Patient had a abnormal nuclear stress test and [facility] in 2019 that also showed infarction.  I reviewed the Myoview by Dr. Stanford Breed  and the patient.  We discussed cardiac catheterization, however patient was hesitant to do so.  He wished to do a trial of medical therapy first as he is concerned about his renal function and the  possibility of contrast nephropathy, he has upcoming nephrology visit. he is aware that medical therapy is aimed at improving the symptoms however it does not change the underlying anatomy.  He is aware to seek urgent medical attention if the symptom worsens.  I will increase Imdur to 90 mg daily and did increase but no losing 2000 mg twice a day  CAD: See #1.  Continue aspirin and Lipitor  Hypertension: Blood pressure stable.  Increase Imdur for antianginal purposes  Hyperlipidemia: On Lipitor  History of CVA: No recent recurrence.           Medication Adjustments/Labs and Tests Ordered: Current medicines are reviewed at length with the patient today.  Concerns regarding medicines are outlined above.  No orders of the defined types were placed in this encounter.  Meds ordered this encounter  Medications   ranolazine (RANEXA) 1000 MG SR tablet    Sig: Take 1 tablet (1,000 mg total) by mouth 2 (two) times daily.    Dispense:  180 tablet    Refill:  3    Dose change new Rx   isosorbide mononitrate (IMDUR) 60 MG 24 hr tablet    Sig: Take 1.5 tablets (90 mg total) by mouth every morning.    Dispense:  135 tablet    Refill:  3    Dose change new Rx    Patient Instructions  Medication Instructions:  INCREASE Imdur to 90 mg daily  INCREASE Ranolazine to 1000 mg 2 times a day  *If you need a refill on your cardiac medications before your next appointment, please call your pharmacy*  Lab Work: NONE ordered at this time of appointment   If you have labs (blood work) drawn today and your tests are completely normal, you will receive your results only by: MyChart Message (if you have MyChart) OR A paper copy in the mail If you have any lab test that is abnormal or we need to change your treatment, we will call you to review the results.  Testing/Procedures: NONE ordered at this time of appointment   Follow-Up: At [facility], you and your health needs are our  priority.  As part of our continuing mission to provide you with exceptional heart care, we have created designated Provider Care Teams.  These Care Teams include your primary Cardiologist (physician) and Advanced Practice Providers (APPs -  Physician Assistants and Nurse Practitioners) who all work together to provide you with the care you need, when you need it.  Your next appointment:   2 month(s)  The format for your next appointment:   In Person  Provider:   APP on a day Dr. Stanford Breed is in the office      Other Instructions  Important Information About [place]         Hilbert Corrigan, [place]  11/09/2022 9:28 PM    [place]

## 2022-11-07 NOTE — Telephone Encounter (Signed)
   Patient Name: [name]  DOB: [birth year] MRN: [id number]  Primary Cardiologist: Kirk Ruths, MD  Chart reviewed as part of pre-operative protocol coverage. Cataract extractions are recognized in guidelines as low risk surgeries that do not typically require specific preoperative testing or holding of blood thinner therapy. Therefore, given past medical history and time since last visit, based on ACC/AHA guidelines, [name] would be at acceptable risk for the planned procedure without further cardiovascular testing.   I will route this recommendation to the requesting party via Epic fax function and remove from pre-op pool.  Please call with questions.  Emmaline Life, NP-C  11/07/2022, 12:07 PM [address], [address] Office [phone] Fax [phone]

## 2022-11-07 NOTE — Telephone Encounter (Signed)
   Pre-operative Risk Assessment    Patient Name: [name]  DOB: 26-Sep-1941 MRN: [id number]      Request for Surgical Clearance    Procedure:   Cataract Removal  Date of Surgery:  Clearance 11/19/22                                 Surgeon:  Monna Fam, MD Surgeon's Group or Practice Name:  [facility] [place] Phone number:  [phone] Fax number:  [phone]   Type of Clearance Requested:   - Medical    Type of Anesthesia:  Local    Additional requests/questions:   There is No need to discontinue any blood thinning agents for this procedure.  Signed, Elsie Lincoln Nahiem Dredge   11/07/2022, 10:18 AM

## 2022-11-07 NOTE — Patient Instructions (Signed)
Medication Instructions:  INCREASE Imdur to 90 mg daily  INCREASE Ranolazine to 1000 mg 2 times a day  *If you need a refill on your cardiac medications before your next appointment, please call your pharmacy*  Lab Work: NONE ordered at this time of appointment   If you have labs (blood work) drawn today and your tests are completely normal, you will receive your results only by: [place] (if you have MyChart) OR A paper copy in the mail If you have any lab test that is abnormal or we need to change your treatment, we will call you to review the results.  Testing/Procedures: NONE ordered at this time of appointment   Follow-Up: At [facility], you and your health needs are our priority.  As part of our continuing mission to provide you with exceptional heart care, we have created designated Provider Care Teams.  These Care Teams include your primary Cardiologist (physician) and Advanced Practice Providers (APPs -  Physician Assistants and Nurse Practitioners) who all work together to provide you with the care you need, when you need it.  Your next appointment:   2 month(s)  The format for your next appointment:   In Person  Provider:   APP on a day Dr. Stanford Breed is in the office      Other Instructions  Important Information About [place]

## 2022-11-09 ENCOUNTER — Encounter: Payer: Self-pay | Admitting: Physician Assistant

## 2022-11-18 ENCOUNTER — Telehealth: Payer: Self-pay

## 2022-11-18 NOTE — Telephone Encounter (Addendum)
Left voice message for patient to call back for results.  ----- Message from Finis Bud, NP sent at 11/17/2022  4:23 PM EST ----- Please let [name]  know that his monitor has been reviewed. Average HR 64 with lowest HR 38 and fastest HR 214. Predominantly NSR, bundle branch block present. 47 episodes of SVT (fast HR originating from top chambers of heart). Longest episode of SVT was almost 3 minutes with average HR at 109, SVT episodes were not triggered. Triggered events were mainly associated with sinus rhythm. Early beats noted as well as ventricular bigeminy and trigeminy.   Is he having symptoms of palpitations or any dizziness? His most recent electrolytes were normal. If so, I would be happy to prescribe an as needed medication to help his palpitations. If not, we will continue to monitor and have him f/u with Diona Browner, NP 12/2022.   Thanks!   Best,  Finis Bud, NP

## 2022-12-04 ENCOUNTER — Ambulatory Visit (HOSPITAL_COMMUNITY)
Admission: RE | Admit: 2022-12-04 | Discharge: 2022-12-04 | Disposition: A | Discharge status: Home / Self Care | Payer: Medicare Other | Source: Ambulatory Visit | Attending: Cardiology | Admitting: Cardiology

## 2022-12-04 DIAGNOSIS — I6523 Occlusion and stenosis of bilateral carotid arteries: Secondary | ICD-10-CM | POA: Diagnosis present

## 2022-12-06 ENCOUNTER — Telehealth: Payer: Self-pay

## 2022-12-06 DIAGNOSIS — I5032 Chronic diastolic (congestive) heart failure: Secondary | ICD-10-CM

## 2022-12-06 MED ORDER — FUROSEMIDE 20 MG PO TABS: 40.0000 mg | ORAL_TABLET | Freq: Every day | ORAL | 2 refills | Status: AC

## 2022-12-06 NOTE — Telephone Encounter (Signed)
Per chat message from Finis Bud, NP  Was notified by Echo tech that he was reporting [facility] and lower extremtity swelling x 2 weeks. If his BP's are okay, let's increase his Lasix to 40 mg daily and have him see an APP within the next week. Please also go over CHF education piece with him too. Last saw Almyra Deforest, PA-C and will no longer be seeing me as I am in [place] now.   Updated medication list to reflect change.

## 2022-12-06 NOTE — Telephone Encounter (Signed)
Called patient and informed him of Drue Novel recommendations. Patient verbalized understanding and all (if any) questions were answered. Patient was originally scheduled to see Fabian Sharp, PA-C on 12/11/22 at 2:45 PM.

## 2022-12-08 NOTE — Progress Notes (Unsigned)
Cardiology Office Note:    Date:  12/11/2022   ID:  [name], DOB February 01, 1941, MRN [id number]  PCP:  Jacob Reef, MD   [facility] Cardiologist:  Kirk Ruths, MD Cardiology APP:  Ledora Bottcher, PA { Referring MD: Jacob Reef, MD   Chief Complaint  Patient presents with   Follow-up    CHF    History of Present Illness:    [name] is a 81 y.o. male with a hx of CAD s/p DES to LAD 07/1190, chronic diastolic heart failure, OSA on CPAP (Dr. Halford Chessman), HTN, HLD, CVA, CKD, PAF not on [facility] with watchman in place, and PAD s/p L CEA in 07/2011. He suffered ICH with hematoma 2003, he did not require surgery/evacuation. Exercise myoview 02/04/15 with down-sloping ST changes but no perfusion defects. Normal LV function by echo 2016. Repeat nuclear stress test 2017 was nonischemic bur did show apical thinning, due to ongoing chest pain, he underwent heart catheterization 08/27/26 with 85% mid RCA and 90% distal RCA lesions, 85% 3rd RPLB lesion and 20% mid LAD. He was taken back for staged PCI with DES to mid-distal RCA using 2.5 x 24 mm stent. Nuclear stress test 2019 with prior infarct inferolaterally with mid peri-infarct ischemia. He had a watchman device placed 2021 for PAF. TEE at [facility] 2021 showed normal EF, LAE with watchman completely occluded LAA, mild MR and mild AI. Carotid doppler at [facility] showed 1-39% right ICA disease, >50% right subclavian stenosis, and patent left CEA site. He was seen in clinic by Finis Bud NP 09/19/22 and reported dizziness and weakness.   Amlodipine and atenolol were discontinued and he was stated on 25 mg metoprolol. Due to worsening renal function, he was referred to nephrology and bumex was discontinued. Heart monitor showed 47 episodes of SVT and 2.3% PVC burden, could not exclude short duration of atrial flutter - not long enough for anticoagulation. DC of bumex improved renal function and he was  started on 20 mg lasix daily. Due to chest discomfort he underwent repeat nuclear stress test with evidence of prior MI but no ischemia. He was seen by Almyra Deforest PA-C 11/07/22 and he continued to report chest pain. Through shared decision making, medical therapy would be titrated and ranexa increased to 100 mg BID and imdur increased to 90 mg. Due to increasing SOB and LEE, lasix was increased to 40 mg.   He was placed on my schedule for follow up and to review CHF education. He saw nephrology last week, we appreciate their input.  They increased lasix to 40 mg daily. He is unsure if he is taking 12.5 mg spironolactone. I will defer to nephrology to restart spironolactone. His chest pain is improved, but he is short of breath and describes waking up gasping for air. With further questioning, he has been sleeping in the recliner and not using his CPAP.  I suspect nocturnal dyspnea due to apnea and not fluid. We discussed heart failure education and low sodium diet. During stressful holiday season, they have been eating more processed food. DIL also has 19 puppies at home.    Past Medical History:  Diagnosis Date   Acute diastolic congestive heart failure ([place]) 09/20/2015   Arthritis    "fingers" (08/27/2016)   Bilateral carotid artery disease (HCC)    Status post left endarterectomy   CAD (coronary artery disease)    a. 06/2007 PCI LAD->Cypher DES;  b. 02/2010 Cath LM 30-40, LAD  40-89m patent stent, D2 50, RI 764ost, LCX 560 OM 30, RCA 30-426m30d, PDA 50, RPL 40-50p, nl EF.   CAD (coronary artery disease)    Status post DES to LAD in 2008, DES x2 to RCA in 2017   Carotid artery occlusion    a. 07/2011 L CEA;  b. 02/2012 Carotid U/S: RICA 01-02-11%LICA patent.   Chronic back pain    "moves up and down my back" (08/27/2016)   Chronic bronchitis (HCC)    [facility]   Nephrology referral placed   CVA (cerebral vascular accident) ([facility], 2004   "I've only had  1; hemorrhagic; don't remember specifically when" (08/27/2016)   GERD (gastroesophageal reflux disease)    Heart murmur    History of blood transfusion    "related to a surgery I think"   Hyperlipidemia    Hypertension    Kidney stones    OSA on CPAP 07/20/2015   PAF (paroxysmal atrial fibrillation) (HCC)    Not on anticoagulation due to Watchman device   Peripheral vascular disease ([facility]   Pneumonia    "a number of times" (08/27/2016)   Type II diabetes mellitus ([facility]    Past Surgical History:  Procedure Laterality Date   CARDIAC CATHETERIZATION  02/2010; 08/27/2016   "no stent"/notes 03/28/2010; unable to put stent in cause of spasms"   CARDIAC CATHETERIZATION N/A 08/27/2016   Procedure: Left Heart Cath and Coronary Angiography;  Surgeon: ThTroy SineMD;  [facility];  Service: Cardiovascular;  Laterality: N/A;   CARDIAC CATHETERIZATION N/A 08/28/2016   Procedure: Coronary Stent Intervention;  Surgeon: ThTroy SineMD;  [place] [facility];  Service: Cardiovascular;  Laterality: N/A;   CAROTID ENDARTERECTOMY Left 08/23/2011   CORONARY ANGIOPLASTY WITH STENT PLACEMENT  06-30-07   CYSTOSCOPY W/ STONE MANIPULATION     INGUINAL HERNIA REPAIR Right [place] "cut me open"    Current Medications: Current Meds  Medication Sig   albuterol (PROVENTIL HFA;VENTOLIN HFA) 108 (90 Base) MCG/ACT inhaler Inhale 2 puffs into the lungs every 6 (six) hours as needed for wheezing or shortness of breath.   amLODipine (NORVASC) 10 MG tablet Take 1 tablet (10 mg total) by mouth daily.   aspirin EC 81 MG tablet Take 1 tablet (81 mg total) by mouth daily.   atorvastatin (LIPITOR) 80 MG tablet Take 1 tablet (80 mg total) by mouth daily. Office visit needed before additional refills   Cholecalciferol (VITAMIN D3) 50 MCG (2000 UT) capsule Take 2,000 Units by mouth daily.   fluticasone (FLONASE) 50 MCG/ACT nasal spray Place 2 sprays into both  nostrils daily.   furosemide (LASIX) 20 MG tablet Take 2 tablets (40 mg total) by mouth daily.   Insulin Degludec (TRESIBA FLEXTOUCH [place]) Inject 48 Units into the skin daily.   isosorbide mononitrate (IMDUR) 60 MG 24 hr tablet Take 1.5 tablets (90 mg total) by mouth every morning.   levocetirizine (XYZAL) 5 MG tablet    MAGNESIUM-OXIDE 400 (240 Mg) MG tablet    metoprolol succinate (TOPROL XL) 50 MG 24 hr tablet Take 1 tablet (50 mg total) by mouth daily.   nitroGLYCERIN (NITROSTAT) 0.4 MG SL tablet Place 1 tablet (0.4 mg total) under the tongue every 5 (five) minutes as needed for chest pain.   pantoprazole (PROTONIX) 40 MG tablet Take 1 tablet (40 mg total) by mouth  daily.   ranolazine (RANEXA) 1000 MG SR tablet Take 1 tablet (1,000 mg total) by mouth 2 (two) times daily.   TRADJENTA 5 MG TABS tablet Take 5 mg by mouth daily.   [DISCONTINUED] spironolactone (ALDACTONE) 25 MG tablet Take 12.5 mg by mouth daily.   Current Facility-Administered Medications for the 12/11/22 encounter (Office Visit) with Ledora Bottcher, PA  Medication   ipratropium-albuterol (DUONEB) 0.5-2.5 (3) MG/3ML nebulizer solution 3 mL     Allergies:   Clopidogrel bisulfate and Adhesive [tape]   Social History   Socioeconomic History   Marital status: Widowed    Spouse name: Not on file   Number of children: Not on file   Years of education: Not on file   Highest education level: Not on file  Occupational History   Occupation: Medical illustrator  Tobacco Use   Smoking status: Never   Smokeless tobacco: Never  Substance and Sexual Activity   Alcohol use: No    Alcohol/week: 0.0 standard drinks of alcohol   Drug use: No   Sexual activity: Not Currently  Other Topics Concern   Not on file  Social History Narrative   Works part time as an Medical illustrator.   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    One son who lives in [place] - son is adopted.    She takes care of his wife, she is oxygen continuously.    Social  Determinants of Health   Financial Resource Strain: Not on file  Food Insecurity: Not on file  Transportation Needs: Not on file  Physical Activity: Not on file  Stress: Not on file  Social Connections: Not on file     Family History: The patient's family history includes COPD in his brother; Cancer in his brother and brother; Heart disease in his father and mother.  ROS:   Please see the history of present illness.     All other systems reviewed and are negative.  EKGs/Labs/Other Studies Reviewed:    The following studies were reviewed today:  Echo 05/2021: 1. Left ventricular ejection fraction, by estimation, is 55 to 60%. The  left ventricle has normal function. The left ventricle has no regional  wall motion abnormalities. The left ventricular internal cavity size was  moderately dilated. There is mild  left ventricular hypertrophy. Left ventricular diastolic parameters are  consistent with Grade I diastolic dysfunction (impaired relaxation).  Elevated left atrial pressure.   2. Right ventricular systolic function is normal. The right ventricular  size is normal.   3. Left atrial size was mildly dilated.   4. The mitral valve is normal in structure. Trivial mitral valve  regurgitation. No evidence of mitral stenosis.   5. The aortic valve is calcified. Aortic valve regurgitation is not  visualized. No aortic stenosis is present.   6. The inferior vena cava is normal in size with greater than 50%  respiratory variability, suggesting right atrial pressure of 3 mmHg.    EKG:  EKG is not ordered today.    Recent Labs: 09/19/2022: Hemoglobin 11.4; Magnesium 1.6; Platelets 252; TSH 2.030 10/25/2022: ALT 13 10/31/2022: BUN 16; Creatinine, Ser 1.83; Potassium 5.1; Sodium 142  Recent Lipid Panel    Component Value Date/Time   CHOL 100 10/25/2022 1128   TRIG 60 10/25/2022 1128   TRIG 114 12/18/2006 1228   HDL 36 (L) 10/25/2022 1128   CHOLHDL 2.8 10/25/2022 1128   CHOLHDL  2.5 10/03/2016 0859   VLDL 18 10/03/2016 0859   LDLCALC 50 10/25/2022  1128     Risk Assessment/Calculations:                Physical Exam:    VS:  BP 131/61   Pulse 81   Ht '5\' 8"'$  (1.727 m)   Wt 240 lb (108.9 kg)   SpO2 93%   BMI 36.49 kg/m     Wt Readings from Last 3 Encounters:  12/11/22 240 lb (108.9 kg)  11/07/22 244 lb 9.6 oz (110.9 kg)  10/31/22 241 lb (109.3 kg)     GEN:  Well nourished, well developed in no acute distress HEENT: Normal NECK: No JVD; No carotid bruits LYMPHATICS: No lymphadenopathy CARDIAC: RRR, no murmurs, rubs, gallops RESPIRATORY:  Clear to auscultation without rales, wheezing or rhonchi  ABDOMEN: Soft, non-tender, non-distended MUSCULOSKELETAL:  2-3+ B LE edema SKIN: Warm and dry NEUROLOGIC:  Alert and oriented x 3 PSYCHIATRIC:  Normal affect   ASSESSMENT:    1. OSA (obstructive sleep apnea)   2. Bilateral carotid artery disease, unspecified type ([place])   3. Chronic diastolic CHF (congestive heart failure) ([place])   4. CKD (chronic kidney disease) stage 4, GFR 15-29 ml/min (HCC)   5. Coronary artery disease involving native coronary artery of native heart with angina pectoris ([place])   6. CAD S/P percutaneous coronary angioplasty; drug-eluting stent to LAD in 2008, drug-eluting stent x2 to RCA in 2017   7. Primary hypertension   8. Hyperlipidemia with target LDL less than 70   9. Chest pain of uncertain etiology   10. Bilateral carotid artery stenosis   11. OSA on CPAP    PLAN:    In order of problems listed above:  Chronic diastolic heart failure CKD stage IV - worsening renal function on bumex - now on 40 mg lasix daily, toprol - albumin 3.6 - is not taking spironolactone - will take this off and ask them to speak with nephrology regarding 12.5 mg spiro - will defer to nephrology (saw last week) for diuretic and HTN - I advised daily weights   CAD s/p DES-LAD Hyperlipidemia  Hypertension 10/25/2022: Cholesterol, Total  100; HDL 36; LDL Chol Calc (NIH) 50; Triglycerides 60 81 mg ASA, amlodipine, lipitor 80 mg, imdur, toprol, ranexa - hx of right subclavian artery stenosis   Chest pain - ranexa and imdur 90 mg recently increased - reassuring stress test 10/2022 - no further chest pain   Carotid artery stenosis s/p L CEA Recent dopplers with progression of disease on the right, referred to vascular surgery   OSA - CPAP Has not been using but will move it to his recliner. He also asks for a referral to pulmonology.    They have moved from [place] and are trying to re-establish care with specialists. I have sent referrals for VVS and pulmonlogy.   Follow up with me in 3 months.      Medication Adjustments/Labs and Tests Ordered: Current medicines are reviewed at length with the patient today.  Concerns regarding medicines are outlined above.  Orders Placed This Encounter  Procedures   Ambulatory referral to Pulmonology   Ambulatory referral to Vascular Surgery   No orders of the defined types were placed in this encounter.   Patient Instructions  Medication Instructions:  No Changes *If you need a refill on your cardiac medications before your next appointment, please call your pharmacy*   Lab Work: No Labs If you have labs (blood work) drawn today and your tests are completely normal, you will receive your results  only by: [employer] (if you have MyChart) OR A paper copy in the mail If you have any lab test that is abnormal or we need to change your treatment, we will call you to review the results.   Testing/Procedures: No Testing   Follow-Up: At [facility], you and your health needs are our priority.  As part of our continuing mission to provide you with exceptional heart care, we have created designated Provider Care Teams.  These Care Teams include your primary Cardiologist (physician) and Advanced Practice Providers (APPs -  Physician Assistants and Nurse  Practitioners) who all work together to provide you with the care you need, when you need it.  We recommend signing up for the patient portal called "MyChart".  Sign up information is provided on this After Visit Summary.  MyChart is used to connect with patients for Virtual Visits (Telemedicine).  Patients are able to view lab/test results, encounter notes, upcoming appointments, etc.  Non-urgent messages can be sent to your provider as well.   To learn more about what you can do with MyChart, go to [email or web address].    Your next appointment:   3 month(s)  The format for your next appointment:   In Person  Provider:   Fabian Sharp, PA-C      Other Instructions Call nephrology if should be taking 12.5 mg Spironolactone . Monitor weight Daily . For weight gain of 3 pounds overnight and 5 pounds in a week Call office.  Important Information About Sugar         Signed, Ledora Bottcher, [place]  12/11/2022 3:38 PM    [facility]

## 2022-12-09 ENCOUNTER — Telehealth: Payer: Self-pay | Admitting: Cardiology

## 2022-12-09 NOTE — Telephone Encounter (Signed)
Patient stated he has SOB at rest, PND, and with exertion for past week. He wakes up during the night gasping for breath. He had fluid retention last weekend and was told to double diuretic. He did and the swelling in legs, ankles and feet has decrease some.  Patient asked for results of bilat carotid US. Per E. Peck: "Dopplers results reviewed. Does have known hx of carotid artery disease. RICA revealed 22-56% narrowing and LICA revealed 7-20% narrowing. Disturbed blood flow in right subclavian artery. Normal flow seen in left subclavian artery. He has followed Vascular Surgery for past hx and I do not see where he has seen them recently. Let's place referral to VVS for bilateral carotid artery stenosis."   Recommended that if SOB worsens or he feels fain, to call 911. He verbalized understanding. Patient has appointment with A. Duke on 12/13.

## 2022-12-09 NOTE — Telephone Encounter (Signed)
Pt c/o Shortness Of Breath: STAT if SOB developed within the last 24 hours or pt is noticeably SOB on the phone  1. Are you currently SOB (can you hear that pt is SOB on the phone)? No   2. How long have you been experiencing SOB? Around 12/04  3. Are you SOB when sitting or when up moving around? Both, but worse when moving around   4. Are you currently experiencing any other symptoms? No    Patient is calling to update that increasing diuretics has helped with his swelling. It is not completely gone, but has improved. He states SOB is still the same.

## 2022-12-10 ENCOUNTER — Telehealth: Payer: Self-pay | Admitting: Cardiology

## 2022-12-10 ENCOUNTER — Telehealth: Payer: Self-pay

## 2022-12-10 NOTE — Telephone Encounter (Signed)
Follow Up:       Patient is returning a call, concerning his ultrasound results.

## 2022-12-10 NOTE — Telephone Encounter (Addendum)
Left voice message for patient to return call to office for results of recent test. Will try calling again.  ----- Message from Finis Bud, NP sent at 12/07/2022 12:24 PM EST ----- Dopplers results reviewed. Does have known hx of carotid artery disease. RICA revealed 43-15% narrowing and LICA revealed 4-00% narrowing. Disturbed blood flow in right subclavian artery. Normal flow seen in left subclavian artery. He has followed Vascular Surgery for past hx and I do not see where he has seen them recently. Let's place referral to VVS for bilateral carotid artery stenosis.   Thanks!   Best, Finis Bud, AGNP-C

## 2022-12-11 ENCOUNTER — Encounter: Payer: Self-pay | Admitting: Physician Assistant

## 2022-12-11 ENCOUNTER — Ambulatory Visit: Payer: Medicare Other | Attending: Physician Assistant | Admitting: Physician Assistant

## 2022-12-11 VITALS — BP 131/61 | HR 81 | Ht 68.0 in | Wt 240.0 lb

## 2022-12-11 DIAGNOSIS — I2510 Atherosclerotic heart disease of native coronary artery without angina pectoris: Secondary | ICD-10-CM | POA: Diagnosis present

## 2022-12-11 DIAGNOSIS — G4733 Obstructive sleep apnea (adult) (pediatric): Secondary | ICD-10-CM | POA: Insufficient documentation

## 2022-12-11 DIAGNOSIS — I25119 Atherosclerotic heart disease of native coronary artery with unspecified angina pectoris: Secondary | ICD-10-CM | POA: Diagnosis present

## 2022-12-11 DIAGNOSIS — Z9861 Coronary angioplasty status: Secondary | ICD-10-CM | POA: Diagnosis present

## 2022-12-11 DIAGNOSIS — N184 Chronic kidney disease, stage 4 (severe): Secondary | ICD-10-CM | POA: Diagnosis not present

## 2022-12-11 DIAGNOSIS — R079 Chest pain, unspecified: Secondary | ICD-10-CM | POA: Insufficient documentation

## 2022-12-11 DIAGNOSIS — I10 Essential (primary) hypertension: Secondary | ICD-10-CM | POA: Diagnosis present

## 2022-12-11 DIAGNOSIS — I5032 Chronic diastolic (congestive) heart failure: Secondary | ICD-10-CM | POA: Insufficient documentation

## 2022-12-11 DIAGNOSIS — E785 Hyperlipidemia, unspecified: Secondary | ICD-10-CM | POA: Insufficient documentation

## 2022-12-11 DIAGNOSIS — I779 Disorder of arteries and arterioles, unspecified: Secondary | ICD-10-CM | POA: Diagnosis not present

## 2022-12-11 DIAGNOSIS — I6523 Occlusion and stenosis of bilateral carotid arteries: Secondary | ICD-10-CM | POA: Diagnosis present

## 2022-12-11 NOTE — Patient Instructions (Addendum)
Medication Instructions:  No Changes *If you need a refill on your cardiac medications before your next appointment, please call your pharmacy*   Lab Work: No Labs If you have labs (blood work) drawn today and your tests are completely normal, you will receive your results only by: [place] (if you have MyChart) OR A paper copy in the mail If you have any lab test that is abnormal or we need to change your treatment, we will call you to review the results.   Testing/Procedures: No Testing   Follow-Up: At [facility], you and your health needs are our priority.  As part of our continuing mission to provide you with exceptional heart care, we have created designated Provider Care Teams.  These Care Teams include your primary Cardiologist (physician) and Advanced Practice Providers (APPs -  Physician Assistants and Nurse Practitioners) who all work together to provide you with the care you need, when you need it.  We recommend signing up for the patient portal called "MyChart".  Sign up information is provided on this After Visit Summary.  MyChart is used to connect with patients for Virtual Visits (Telemedicine).  Patients are able to view lab/test results, encounter notes, upcoming appointments, etc.  Non-urgent messages can be sent to your provider as well.   To learn more about what you can do with MyChart, go to [email or web address].    Your next appointment:   3 month(s)  The format for your next appointment:   In Person  Provider:   Fabian Sharp, PA-C      Other Instructions Call nephrology if should be taking 12.5 mg Spironolactone . Monitor weight Daily . For weight gain of 3 pounds overnight and 5 pounds in a week Call office.  Important Information About Sugar

## 2022-12-24 ENCOUNTER — Telehealth: Payer: Self-pay | Admitting: Cardiology

## 2022-12-24 NOTE — Telephone Encounter (Signed)
Son stated that for the past 2 days, patient has been getting SOB at rest and uses CPAP during the day for some relief. Patient is alert and talking fine, but sleeps more during the day. He watches NA intake. He has not used his inhaler for years. Son wants to know if patient can be ordered oxygen. They do not have a pulse ox. Referred son to PCP. Son then stated they are not in town and was hoping to get an order for oxygen. Recommended that if patient's SOB gets any worse, to take patient to the ED. Son stated he was trying to avoid an ED out of town because patient may end up staying there for a while. Reiterated to take patient to ED if patient is struggling to breathe, to avoid respiratory/cardiac arrest. Son verbalized understanding and stated he would call PCP after our phone call.

## 2022-12-24 NOTE — Telephone Encounter (Signed)
Pt c/o Shortness Of Breath: STAT if SOB developed within the last 24 hours or pt is noticeably SOB on the phone  1. Are you currently SOB (can you hear that pt is SOB on the phone)? Yes, it's pretty much constant at this point   2. How long have you been experiencing SOB? Has worsened over past couple of days   3. Are you SOB when sitting or when up moving around? Both, but worse when moving around   4. Are you currently experiencing any other symptoms? No    Patient has resulted to using CPAP during the day to help with SOB. Son is wanting to know if oxygen can be prescribed to help with breathing. Patient is not scheduled to see pulmonology until 01/12. They are currently out of town, but come back tomorrow night. Reports no other symptoms. Please advise.

## 2022-12-31 NOTE — Telephone Encounter (Signed)
Patient has been seen in the ER 

## 2023-01-09 ENCOUNTER — Encounter: Payer: Medicare Other | Admitting: Vascular Surgery

## 2023-01-10 ENCOUNTER — Ambulatory Visit: Payer: Medicare Other | Admitting: Adult Health

## 2023-01-10 ENCOUNTER — Institutional Professional Consult (permissible substitution) (HOSPITAL_BASED_OUTPATIENT_CLINIC_OR_DEPARTMENT_OTHER): Payer: Medicare Other | Admitting: Pulmonary Disease

## 2023-01-17 ENCOUNTER — Ambulatory Visit: Payer: Medicare Other | Admitting: Nurse Practitioner

## 2023-01-23 ENCOUNTER — Encounter (INDEPENDENT_AMBULATORY_CARE_PROVIDER_SITE_OTHER): Payer: Medicare Other | Admitting: Ophthalmology

## 2023-01-30 DEATH — deceased

## 2023-02-11 ENCOUNTER — Ambulatory Visit: Payer: Medicare Other | Admitting: Physician Assistant

## 2023-02-27 ENCOUNTER — Ambulatory Visit: Payer: Medicare Other | Admitting: Physician Assistant
